# Patient Record
Sex: Female | Born: 1950 | Race: White | Hispanic: No | Marital: Married | Smoking: Never smoker
Health system: Southern US, Community
[De-identification: ages and names within clinical notes are randomized; demographics above are authoritative.]

## PROBLEM LIST (undated history)

## (undated) DIAGNOSIS — E7800 Pure hypercholesterolemia, unspecified: Secondary | ICD-10-CM

## (undated) DIAGNOSIS — T7840XA Allergy, unspecified, initial encounter: Secondary | ICD-10-CM

## (undated) DIAGNOSIS — C801 Malignant (primary) neoplasm, unspecified: Secondary | ICD-10-CM

## (undated) DIAGNOSIS — Z923 Personal history of irradiation: Secondary | ICD-10-CM

## (undated) DIAGNOSIS — J302 Other seasonal allergic rhinitis: Secondary | ICD-10-CM

## (undated) HISTORY — PX: ABDOMINAL HYSTERECTOMY: SHX81

## (undated) HISTORY — DX: Allergy, unspecified, initial encounter: T78.40XA

## (undated) HISTORY — DX: Other seasonal allergic rhinitis: J30.2

## (undated) HISTORY — PX: BREAST LUMPECTOMY: SHX2

## (undated) HISTORY — DX: Malignant (primary) neoplasm, unspecified: C80.1

## (undated) HISTORY — PX: TONSILLECTOMY: SUR1361

## (undated) HISTORY — DX: Pure hypercholesterolemia, unspecified: E78.00

---

## 1986-12-03 HISTORY — PX: PARTIAL HYSTERECTOMY: SHX80

## 2001-12-03 HISTORY — PX: BREAST EXCISIONAL BIOPSY: SUR124

## 2005-04-24 ENCOUNTER — Ambulatory Visit: Payer: Self-pay | Admitting: Internal Medicine

## 2006-08-15 ENCOUNTER — Ambulatory Visit: Payer: Self-pay | Admitting: Internal Medicine

## 2006-08-26 ENCOUNTER — Ambulatory Visit: Payer: Self-pay | Admitting: Internal Medicine

## 2007-09-10 ENCOUNTER — Ambulatory Visit: Payer: Self-pay | Admitting: Internal Medicine

## 2007-11-06 ENCOUNTER — Ambulatory Visit: Payer: Self-pay | Admitting: Unknown Physician Specialty

## 2008-12-21 ENCOUNTER — Ambulatory Visit: Payer: Self-pay | Admitting: Internal Medicine

## 2010-06-26 ENCOUNTER — Ambulatory Visit: Payer: Self-pay | Admitting: Internal Medicine

## 2011-10-29 ENCOUNTER — Ambulatory Visit: Payer: Self-pay | Admitting: Family Medicine

## 2012-01-08 LAB — TSH: TSH: 3.79 u[IU]/mL (ref <=5.90)

## 2012-01-08 LAB — BASIC METABOLIC PANEL
BUN: 10 mg/dL (ref 4–21)
Creatinine: 0.8 mg/dL (ref <=1.1)

## 2013-01-20 ENCOUNTER — Ambulatory Visit: Payer: Self-pay | Admitting: Internal Medicine

## 2013-02-11 ENCOUNTER — Telehealth: Payer: Self-pay | Admitting: Internal Medicine

## 2013-02-11 NOTE — Telephone Encounter (Signed)
Opened in error

## 2013-02-16 ENCOUNTER — Telehealth: Payer: Self-pay | Admitting: Internal Medicine

## 2013-02-16 NOTE — Telephone Encounter (Signed)
Pt has appointment  03/17/13 but wanted to see if she could be see sooner.  She thinks she has sinus infection. Pt dad is in hospital and she wants to make sure she is not contagious

## 2013-02-16 NOTE — Telephone Encounter (Signed)
I can work her in tomorrow at 11:45 for this problem.

## 2013-02-16 NOTE — Telephone Encounter (Signed)
Appointment 3/18 @ 9:30 pt aware

## 2013-02-17 ENCOUNTER — Encounter: Payer: Self-pay | Admitting: Internal Medicine

## 2013-02-17 ENCOUNTER — Ambulatory Visit (INDEPENDENT_AMBULATORY_CARE_PROVIDER_SITE_OTHER): Payer: BC Managed Care – PPO | Admitting: Internal Medicine

## 2013-02-17 VITALS — BP 128/80 | HR 80 | Temp 97.6°F | Ht 63.16 in | Wt 115.2 lb

## 2013-02-17 DIAGNOSIS — J019 Acute sinusitis, unspecified: Secondary | ICD-10-CM

## 2013-02-17 MED ORDER — FLUTICASONE PROPIONATE 50 MCG/ACT NA SUSP: 2.0000 | Freq: Every day | NASAL | Status: DC

## 2013-02-17 MED ORDER — LEVOFLOXACIN 500 MG PO TABS: 500.0000 mg | ORAL_TABLET | Freq: Every day | ORAL | Status: DC

## 2013-02-17 NOTE — Patient Instructions (Addendum)
Afrin nasal spray - 2 sprays each nostril 2x/day for three days.  Can also use around air flight as discussed.  Flonase nasal spray - 2 sprays each nostril one time per day - do in the evening.  Can continue the saline flushes.  You will have Levaquin 500mg  - one per day.

## 2013-02-22 ENCOUNTER — Encounter: Payer: Self-pay | Admitting: Internal Medicine

## 2013-02-22 NOTE — Progress Notes (Signed)
  Subjective:    Patient ID: Tracy Frey, female    DOB: [birth year], 62 y.o.   MRN: [id number]  HPI 62 year old female with past history of hypercholesterolemia who comes in today as a work in with concerns regarding a possible sinus infection.  She states symptoms started two weeks ago.  Increased sinus pressure - frontal and maxillary sinus pressure.  Thick colored mucus production.  Intermittently feels as if her ears are closed.  Intermittent sore throat.  Increased drainage.  Cough is better.  Some fatigue.  No nausea or vomiting.  No sob.     Past Medical History  Diagnosis Date  . Hypercholesterolemia   . Seasonal allergies     Outpatient Encounter Prescriptions as of 02/17/2013  Medication Sig Dispense Refill  . cetirizine (ZYRTEC) 10 MG tablet Take 10 mg by mouth daily.      . Oxymetazoline HCl (NASAL SPRAY 12 HOUR NA) Place into the nose.      . pseudoephedrine (SUDAFED) 30 MG tablet Take 30 mg by mouth every 4 (four) hours as needed for congestion.      Marland Kitchen spironolactone (ALDACTONE) 25 MG tablet Take 25 mg by mouth 2 (two) times daily.      . fluticasone (FLONASE) 50 MCG/ACT nasal spray Place 2 sprays into the nose daily.  16 g  1  . levofloxacin (LEVAQUIN) 500 MG tablet Take 1 tablet (500 mg total) by mouth daily.  10 tablet  0   No facility-administered encounter medications on file as of 02/17/2013.    Review of Systems Patient denies any lightheadedness or dizziness.  Does report increased sinus pressure as outlined above.  Increased nasal congestion and drainage.  Intermittent sore throat.  No chest pain, tightness or palpitations.  No increased shortness of breath.  Cough is better.   No nausea or vomiting.  No abdominal pain or cramping.  No bowel change, such as diarrhea.          Objective:   Physical Exam Filed Vitals:   02/17/13 0944  BP: 128/80  Pulse: 80  Temp: 97.6 F (19.15 C)   62 year old female in no acute distress.   HEENT:  Nares- erythematous  turbinates.  Oropharynx - without lesions.  TMs visualized without erythema.  Minimal tenderness to palpation over the maxillary and frontal sinus.  NECK:  Supple.  Nontender.  HEART:  Appears to be regular. LUNGS:  No crackles or wheezing audible.  Respirations even and unlabored.  RADIAL PULSE:  Equal bilaterally.         Assessment & Plan:  PROBABLE SINUSITIS.  Treat with Levaquin 500mg  q day x 10 days.  Saline nasal flushes and Flonase as directed.  Mucinex or Robitussin as directed.  Rest.  Fluids.  Explained to her if symptoms changed, worsened or did not resolve - she was to be reevaluated.    HEALTH MAINTENANCE.  She is scheduled to transfer her care to [facility] and a physical in April. Will keep appt.  Last physical 12/25/11.  Colonoscopy 11/06/07.  Due follow up colonoscopy 2018.  Mammogram 10/29/11 - Birads II.  Need to get a follow up mammo scheduled if not already done.

## 2013-03-04 ENCOUNTER — Encounter: Payer: Self-pay | Admitting: *Deleted

## 2013-03-05 ENCOUNTER — Encounter: Payer: Self-pay | Admitting: *Deleted

## 2013-03-17 ENCOUNTER — Ambulatory Visit (INDEPENDENT_AMBULATORY_CARE_PROVIDER_SITE_OTHER): Payer: BC Managed Care – PPO | Admitting: Internal Medicine

## 2013-03-17 ENCOUNTER — Encounter: Payer: Self-pay | Admitting: Internal Medicine

## 2013-03-17 VITALS — BP 130/80 | HR 79 | Temp 98.5°F | Ht 63.5 in | Wt 114.0 lb

## 2013-03-17 DIAGNOSIS — Z1211 Encounter for screening for malignant neoplasm of colon: Secondary | ICD-10-CM

## 2013-03-17 DIAGNOSIS — Z9109 Other allergy status, other than to drugs and biological substances: Secondary | ICD-10-CM

## 2013-03-17 DIAGNOSIS — E7800 Pure hypercholesterolemia, unspecified: Secondary | ICD-10-CM

## 2013-03-17 DIAGNOSIS — Z1239 Encounter for other screening for malignant neoplasm of breast: Secondary | ICD-10-CM

## 2013-03-22 ENCOUNTER — Encounter: Payer: Self-pay | Admitting: Internal Medicine

## 2013-03-22 DIAGNOSIS — E7800 Pure hypercholesterolemia, unspecified: Secondary | ICD-10-CM | POA: Insufficient documentation

## 2013-03-22 DIAGNOSIS — Z9109 Other allergy status, other than to drugs and biological substances: Secondary | ICD-10-CM | POA: Insufficient documentation

## 2013-03-22 NOTE — Assessment & Plan Note (Signed)
Controls with otc meds (zyrtec).  Follow.   

## 2013-03-22 NOTE — Assessment & Plan Note (Signed)
Exercises.  Watches what she eats.  Check lipid profile.
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
## 2013-04-02 ENCOUNTER — Telehealth: Payer: Self-pay | Admitting: Internal Medicine

## 2013-04-02 NOTE — Telephone Encounter (Signed)
Pt has questions about how things are coded.  Pt states that if labs are entered as diagnostic they are not covered as they are if labs are entered as preventative.  Pt states these labs should be entered as part of her yearly physical, and therefore should be covered.

## 2013-04-03 NOTE — Telephone Encounter (Signed)
I am ok to code labs as preventive.  Let me know if I need to do anything.

## 2013-04-16 ENCOUNTER — Other Ambulatory Visit: Payer: Self-pay | Admitting: *Deleted

## 2013-04-16 DIAGNOSIS — Z0000 Encounter for general adult medical examination without abnormal findings: Secondary | ICD-10-CM

## 2013-04-16 NOTE — Telephone Encounter (Signed)
duplicate

## 2013-04-16 NOTE — Telephone Encounter (Signed)
If she wants a routine screening - just do v70.0.  Will you let her know.  Thanks.

## 2013-04-16 NOTE — Telephone Encounter (Signed)
i will cancel and re-enter them, what dx code did you want for them?

## 2013-04-16 NOTE — Telephone Encounter (Signed)
Dr. Lorin Picket, he hasn't came in for these labs yet. I believe that you would choose the code before they are drawn. I am not sure of how to do this.  I will fwd to Eber Jones maybe she knows which button to push.

## 2013-04-16 NOTE — Telephone Encounter (Signed)
Dx code was changed

## 2013-04-16 NOTE — Telephone Encounter (Signed)
i will cancel and re-enter them, what dx code did you want for them?   

## 2013-04-17 ENCOUNTER — Other Ambulatory Visit (INDEPENDENT_AMBULATORY_CARE_PROVIDER_SITE_OTHER): Payer: BC Managed Care – PPO

## 2013-04-17 DIAGNOSIS — Z0000 Encounter for general adult medical examination without abnormal findings: Secondary | ICD-10-CM

## 2013-04-17 LAB — CBC WITH DIFFERENTIAL/PLATELET
Basophils Relative: 0.8 % (ref 0.0–3.0)
Eosinophils Relative: 2 % (ref 0.0–5.0)
Lymphocytes Relative: 24.2 % (ref 12.0–46.0)
MCV: 92.6 fl (ref 78.0–100.0)
Monocytes Absolute: 0.5 10*3/uL (ref 0.1–1.0)
Neutrophils Relative %: 62.7 % (ref 43.0–77.0)
RBC: 4.8 Mil/uL (ref 3.87–5.11)
WBC: 5.1 10*3/uL (ref 4.5–10.5)

## 2013-04-17 LAB — COMPREHENSIVE METABOLIC PANEL
Albumin: 4.2 g/dL (ref 3.5–5.2)
CO2: 32 mEq/L (ref 19–32)
Calcium: 10 mg/dL (ref 8.4–10.5)
Chloride: 102 mEq/L (ref 96–112)
GFR: 64.44 mL/min (ref >60.00)
Glucose, Bld: 97 mg/dL (ref 70–99)
Potassium: 4.4 mEq/L (ref 3.5–5.1)
Sodium: 139 mEq/L (ref 135–145)
Total Protein: 6.6 g/dL (ref 6.0–8.3)

## 2013-04-17 LAB — LIPID PANEL: Cholesterol: 247 mg/dL — ABNORMAL HIGH (ref 0–200)

## 2013-04-19 ENCOUNTER — Encounter: Payer: Self-pay | Admitting: Internal Medicine

## 2013-04-30 ENCOUNTER — Ambulatory Visit: Payer: Self-pay | Admitting: Internal Medicine

## 2013-06-18 ENCOUNTER — Encounter: Payer: Self-pay | Admitting: Internal Medicine

## 2013-10-08 ENCOUNTER — Other Ambulatory Visit: Payer: Self-pay

## 2013-11-06 ENCOUNTER — Telehealth: Payer: Self-pay | Admitting: Internal Medicine

## 2013-11-06 NOTE — Telephone Encounter (Signed)
States to call cell regarding previous ph msg. [phone].

## 2013-11-06 NOTE — Telephone Encounter (Signed)
Unable to call in abx now.  I can see her at 12:15 for work in for this.

## 2013-11-06 NOTE — Telephone Encounter (Signed)
Pt notified & offered appointment today at 12:15. She states that she mentioned when she called this morning that she had a meeting at 11:15 this morning. Pt also mentioned to me that she has to entertain guest as well. She is a long time patient and only calls when she needs to be seen or if she is really sick. I informed patient that we are aware of that, however she has not been seen since April. I offered her an appt on Monday @ 2:15 if she would be willing to wait that long. Otherwise I recommend acute care or urgent care today. Pt states that since her sx's have been ongoing x 1 week now, she would take appt on Monday unless you would be willing to call something in today. Appt scheduled

## 2013-11-06 NOTE — Telephone Encounter (Signed)
Noted. Will see her Monday

## 2013-11-06 NOTE — Telephone Encounter (Signed)
Please advise 

## 2013-11-06 NOTE — Telephone Encounter (Signed)
Pt states she has her usual sinus infection that hits once a year.  Started Thanksgiving, has been ongoing.  Thick drainage.  No fever.  Head/face pressure.  Not a sore throat but tender from drainage.  No aches.  Asking if Dr. Lorin Picket could call in her usual medication.  Also is out of Flonase.

## 2013-11-09 ENCOUNTER — Ambulatory Visit (INDEPENDENT_AMBULATORY_CARE_PROVIDER_SITE_OTHER): Payer: BC Managed Care – PPO | Admitting: Internal Medicine

## 2013-11-09 ENCOUNTER — Encounter: Payer: Self-pay | Admitting: Internal Medicine

## 2013-11-09 VITALS — BP 130/80 | HR 75 | Temp 98.0°F | Ht 63.5 in | Wt 115.0 lb

## 2013-11-09 DIAGNOSIS — J329 Chronic sinusitis, unspecified: Secondary | ICD-10-CM

## 2013-11-09 MED ORDER — FLUTICASONE PROPIONATE 50 MCG/ACT NA SUSP: 2.0000 | Freq: Every day | NASAL | Status: DC

## 2013-11-09 MED ORDER — LEVOFLOXACIN 500 MG PO TABS: 500.0000 mg | ORAL_TABLET | Freq: Every day | ORAL | Status: DC

## 2013-11-09 NOTE — Progress Notes (Signed)
Pre-visit discussion using our clinic review tool. No additional management support is needed unless otherwise documented below in the visit note.  

## 2013-11-09 NOTE — Patient Instructions (Signed)
Saline nasal spray - flush nose at least 2-3x/day.  Flonase - 2 sprays each nostril one time per day.  Do this in the evening.  Mucinex in the am and Robitussin in the evening.  Take the antibiotic as directed.

## 2013-11-12 ENCOUNTER — Encounter: Payer: Self-pay | Admitting: Internal Medicine

## 2013-11-12 NOTE — Progress Notes (Signed)
  Subjective:    Patient ID: Tracy Frey, female    DOB: [birth year], 62 y.o.   MRN: [id number]  Sinusitis  62 year old female with past history of hypercholesterolemia who comes in today as a work in with concerns regarding a possible sinus infection.  She states symptoms started approximately one week ago.   Increased sinus pressure - frontal and maxillary sinus pressure.  Thick colored mucus production.  Intermittently feels as if her ears are closed.  Intermittent sore throat.  Increased drainage.   No nausea or vomiting.  No sob.  Previous chest congestion and cough.  This is better.  Mainly increased sinus pressure now.  Has taken sudafed and used saline nasal spray.    Past Medical History  Diagnosis Date  . Hypercholesterolemia   . Seasonal allergies     Outpatient Encounter Prescriptions as of 11/09/2013  Medication Sig  . Calcium Citrate-Vitamin D (CALCIUM CITRATE + D PO) Take by mouth daily.  . cetirizine (ZYRTEC) 10 MG tablet Take 10 mg by mouth daily.  . Cholecalciferol (VITAMIN D-3) 1000 UNITS CAPS Take by mouth 2 (two) times daily.  . fluticasone (FLONASE) 50 MCG/ACT nasal spray Place 2 sprays into both nostrils daily.  . Glucosamine-Chondroitin (OSTEO BI-FLEX REGULAR STRENGTH PO) Take 1,500 mg by mouth daily.  Boris Lown Oil 300 MG CAPS Take by mouth daily.  . Multiple Vitamins-Minerals (CENTRUM SILVER ADULT 50+) TABS Take by mouth daily.  . pseudoephedrine (SUDAFED) 30 MG tablet Take 30 mg by mouth every 4 (four) hours as needed for congestion.  Marland Kitchen spironolactone (ALDACTONE) 25 MG tablet Take 25 mg by mouth 2 (two) times daily.  . vitamin E 400 UNIT capsule Take 400 Units by mouth every other day.  . [DISCONTINUED] fluticasone (FLONASE) 50 MCG/ACT nasal spray Place 2 sprays into the nose daily.  . [DISCONTINUED] Oxymetazoline HCl (NASAL SPRAY 12 HOUR NA) Place into the nose.  . levofloxacin (LEVAQUIN) 500 MG tablet Take 1 tablet (500 mg total) by mouth daily.    Review of  Systems Patient denies any lightheadedness or dizziness.  Does report increased sinus pressure as outlined above.  Increased nasal congestion and drainage.  Intermittent sore throat.  No chest pain, tightness or palpitations.  No increased shortness of breath.  Cough is better.   No nausea or vomiting.  No abdominal pain or cramping.  No bowel change, such as diarrhea.          Objective:   Physical Exam  Filed Vitals:   11/09/13 1421  BP: 130/80  Pulse: 75  Temp: 98 F (50.83 C)   62 year old female in no acute distress.   HEENT:  Nares- erythematous turbinates.  Oropharynx - without lesions.  TMs visualized without erythema.  Minimal tenderness to palpation over the maxillary and frontal sinus.  NECK:  Supple.  Nontender.  HEART:  Appears to be regular. LUNGS:  No crackles or wheezing audible.  Respirations even and unlabored.        Assessment & Plan:  PROBABLE SINUSITIS.  Treat with Levaquin 500mg  q day x 10 days.  Saline nasal flushes and Flonase as directed.  Mucinex or Robitussin as directed.  Rest.  Fluids.  Explained to her if symptoms changed, worsened or did not resolve - she was to be reevaluated.    HEALTH MAINTENANCE.  Last physical 03/17/13.  Colonoscopy 11/06/07.  Due follow up colonoscopy 2018.  Mammogram 04/30/13 - Birads II.

## 2014-03-18 ENCOUNTER — Telehealth: Payer: Self-pay | Admitting: Internal Medicine

## 2014-03-18 DIAGNOSIS — Z9109 Other allergy status, other than to drugs and biological substances: Secondary | ICD-10-CM

## 2014-03-18 DIAGNOSIS — E7800 Pure hypercholesterolemia, unspecified: Secondary | ICD-10-CM

## 2014-03-18 NOTE — Telephone Encounter (Signed)
Pt called scheduled her cpx 07/14/14 and wanted to have labs prior to appointment Is this ok to schedule

## 2014-03-19 NOTE — Telephone Encounter (Signed)
Order placed for labs.  Ok to schedule.

## 2014-03-19 NOTE — Telephone Encounter (Signed)
Appointment date 8/10 sent my chart message letting pt know about appointment date and time

## 2014-07-09 ENCOUNTER — Telehealth: Payer: Self-pay | Admitting: *Deleted

## 2014-07-09 DIAGNOSIS — Z9109 Other allergy status, other than to drugs and biological substances: Secondary | ICD-10-CM

## 2014-07-09 DIAGNOSIS — E7800 Pure hypercholesterolemia, unspecified: Secondary | ICD-10-CM

## 2014-07-09 NOTE — Telephone Encounter (Signed)
Pt is coming in on Monday what labs and dx?

## 2014-07-09 NOTE — Telephone Encounter (Signed)
Labs ordered.

## 2014-07-12 ENCOUNTER — Other Ambulatory Visit (INDEPENDENT_AMBULATORY_CARE_PROVIDER_SITE_OTHER): Payer: BC Managed Care – PPO

## 2014-07-12 ENCOUNTER — Encounter: Payer: Self-pay | Admitting: Internal Medicine

## 2014-07-12 DIAGNOSIS — E7800 Pure hypercholesterolemia, unspecified: Secondary | ICD-10-CM

## 2014-07-12 DIAGNOSIS — Z9109 Other allergy status, other than to drugs and biological substances: Secondary | ICD-10-CM

## 2014-07-12 LAB — CBC WITH DIFFERENTIAL/PLATELET
BASOS ABS: 0 10*3/uL (ref 0.0–0.1)
Basophils Relative: 0.6 % (ref 0.0–3.0)
EOS ABS: 0.1 10*3/uL (ref 0.0–0.7)
Eosinophils Relative: 2.6 % (ref 0.0–5.0)
HEMATOCRIT: 44.1 % (ref 36.0–46.0)
HEMOGLOBIN: 14.9 g/dL (ref 12.0–15.0)
LYMPHS ABS: 1.4 10*3/uL (ref 0.7–4.0)
LYMPHS PCT: 28.5 % (ref 12.0–46.0)
MCHC: 33.8 g/dL (ref 30.0–36.0)
MCV: 92.9 fl (ref 78.0–100.0)
Monocytes Absolute: 0.5 10*3/uL (ref 0.1–1.0)
Monocytes Relative: 11.2 % (ref 3.0–12.0)
Neutro Abs: 2.8 10*3/uL (ref 1.4–7.7)
Neutrophils Relative %: 57.1 % (ref 43.0–77.0)
Platelets: 245 10*3/uL (ref 150.0–400.0)
RBC: 4.74 Mil/uL (ref 3.87–5.11)
RDW: 12.7 % (ref 11.5–15.5)
WBC: 4.9 10*3/uL (ref 4.0–10.5)

## 2014-07-12 LAB — LIPID PANEL
Cholesterol: 263 mg/dL — ABNORMAL HIGH (ref 0–200)
HDL: 52.7 mg/dL (ref >39.00)
LDL Cholesterol: 186 mg/dL — ABNORMAL HIGH (ref 0–99)
NONHDL: 210.3
TRIGLYCERIDES: 123 mg/dL (ref 0.0–149.0)
Total CHOL/HDL Ratio: 5
VLDL: 24.6 mg/dL (ref 0.0–40.0)

## 2014-07-12 LAB — COMPREHENSIVE METABOLIC PANEL
ALT: 24 U/L (ref 0–35)
AST: 18 U/L (ref 0–37)
Albumin: 4.3 g/dL (ref 3.5–5.2)
Alkaline Phosphatase: 38 U/L — ABNORMAL LOW (ref 39–117)
BILIRUBIN TOTAL: 1.1 mg/dL (ref 0.2–1.2)
BUN: 12 mg/dL (ref 6–23)
CHLORIDE: 103 meq/L (ref 96–112)
CO2: 30 mEq/L (ref 19–32)
Calcium: 10.1 mg/dL (ref 8.4–10.5)
Creatinine, Ser: 0.8 mg/dL (ref 0.4–1.2)
GFR: 83.28 mL/min (ref >60.00)
GLUCOSE: 104 mg/dL — AB (ref 70–99)
Potassium: 4.2 mEq/L (ref 3.5–5.1)
Sodium: 140 mEq/L (ref 135–145)
Total Protein: 6.8 g/dL (ref 6.0–8.3)

## 2014-07-12 LAB — TSH: TSH: 2.39 u[IU]/mL (ref 0.35–4.50)

## 2014-07-14 ENCOUNTER — Ambulatory Visit (INDEPENDENT_AMBULATORY_CARE_PROVIDER_SITE_OTHER): Payer: BC Managed Care – PPO | Admitting: Internal Medicine

## 2014-07-14 ENCOUNTER — Encounter: Payer: Self-pay | Admitting: Internal Medicine

## 2014-07-14 VITALS — BP 130/70 | HR 73 | Temp 98.2°F | Ht 63.25 in | Wt 118.0 lb

## 2014-07-14 DIAGNOSIS — F439 Reaction to severe stress, unspecified: Secondary | ICD-10-CM

## 2014-07-14 DIAGNOSIS — E7800 Pure hypercholesterolemia, unspecified: Secondary | ICD-10-CM

## 2014-07-14 DIAGNOSIS — Z1239 Encounter for other screening for malignant neoplasm of breast: Secondary | ICD-10-CM

## 2014-07-14 DIAGNOSIS — Z733 Stress, not elsewhere classified: Secondary | ICD-10-CM

## 2014-07-14 DIAGNOSIS — Z9109 Other allergy status, other than to drugs and biological substances: Secondary | ICD-10-CM

## 2014-07-14 NOTE — Progress Notes (Signed)
Pre visit review using our clinic review tool, if applicable. No additional management support is needed unless otherwise documented below in the visit note. 

## 2014-07-18 ENCOUNTER — Encounter: Payer: Self-pay | Admitting: Internal Medicine

## 2014-07-18 DIAGNOSIS — F439 Reaction to severe stress, unspecified: Secondary | ICD-10-CM | POA: Insufficient documentation

## 2014-07-18 NOTE — Assessment & Plan Note (Signed)
Watches what she eats.  Has not been exercising.  Plans to restart.  Discussed treatment options with her.  She desires not to take a statin medication.  Wants to try red yeast rice or metamucil.  Follow lipid profile.

## 2014-07-18 NOTE — Assessment & Plan Note (Signed)
Controls with otc meds (zyrtec).  Follow.

## 2014-07-18 NOTE — Progress Notes (Signed)
Subjective:    Patient ID: Tracy Frey, female    DOB: 10-26-1951, 63 y.o.   MRN: [id number]  HPI 63 year old female with past history of hypercholesterolemia who comes in today to follow up on this as well as for her complete physical exam.   Uses zyrtec for allergies.   Increased stress recently.  Her father passed away.  Feels she is handling things relatively well.   No cardiac symptoms with increased activity or exertion.  Breathing stable.  No acid reflux.  Bowels stable.  Due f/u colonoscopy 2018.   Cholesterol elevated.  Desires not to take cholesterol medication.  We discussed this at length today.  Discussed treatment options.     Past Medical History  Diagnosis Date  . Hypercholesterolemia   . Seasonal allergies     Outpatient Encounter Prescriptions as of 07/14/2014  Medication Sig  . Calcium Citrate-Vitamin D (CALCIUM CITRATE + D PO) Take by mouth daily.  . cetirizine (ZYRTEC) 10 MG tablet Take 10 mg by mouth daily.  . Cholecalciferol (VITAMIN D-3) 1000 UNITS CAPS Take by mouth 2 (two) times daily.  . fluticasone (FLONASE) 50 MCG/ACT nasal spray Place 2 sprays into both nostrils daily.  . Glucosamine-Chondroitin (OSTEO BI-FLEX REGULAR STRENGTH PO) Take 1,500 mg by mouth daily.  Javier Docker Oil 300 MG CAPS Take by mouth daily.  . Multiple Vitamins-Minerals (CENTRUM SILVER ADULT 50+) TABS Take by mouth daily.  . pseudoephedrine (SUDAFED) 30 MG tablet Take 30 mg by mouth every 4 (four) hours as needed for congestion.  Marland Kitchen spironolactone (ALDACTONE) 25 MG tablet Take 25 mg by mouth 2 (two) times daily.  . vitamin E 400 UNIT capsule Take 400 Units by mouth every other day.  . [DISCONTINUED] levofloxacin (LEVAQUIN) 500 MG tablet Take 1 tablet (500 mg total) by mouth daily.    Review of Systems Patient denies any headache, lightheadedness or dizziness.  Allergy symptoms appear to be controlled.  No chest pain, tightness or palpitations.  No increased shortness of breath, cough or  congestion.  No acid reflux.  No nausea or vomiting.  No abdominal pain or cramping.  No bowel change, such as diarrhea, constipation, BRBPR or melana.  No urine change. Increased stress.  Feels she is coping relatively well.  Does not feel she needs any further intervention at this time.  Cholesterol elevated.           Objective:   Physical Exam  Filed Vitals:   07/14/14 1036  BP: 130/70  Pulse: 73  Temp: 98.2 F (62.60 C)   63 year old female in no acute distress.   HEENT:  Nares- clear.  Oropharynx - without lesions. NECK:  Supple.  Nontender.  No audible bruit.  HEART:  Appears to be regular. LUNGS:  No crackles or wheezing audible.  Respirations even and unlabored.  RADIAL PULSE:  Equal bilaterally.    BREASTS:  No nipple discharge or nipple retraction present.  Could not appreciate any distinct nodules or axillary adenopathy.  ABDOMEN:  Soft, nontender.  Bowel sounds present and normal.  No audible abdominal bruit.  GU:  Not performed.   EXTREMITIES:  No increased edema present.  DP pulses palpable and equal bilaterally.          Assessment & Plan:  PREVIOUS NASAL FRACTURE.  Saw ENT.  Doing well.  Required no intervention.    HEALTH MAINTENANCE.  Physical today.  Colonoscopy 11/06/07.  Due follow up colonoscopy 2018.  IFOB.  Mammogram 04/30/13 -  Birads II.  Schedule follow up mammogram.   I spent 25 minutes with the patient discussing her increased stress, cholesterol and treatment.  More than 50% of the time was spent in consultation regarding the above.

## 2014-07-18 NOTE — Assessment & Plan Note (Signed)
Increased stress recently as outlined.  Her father just passed away.  Feels she is handling things relatively well.  Follow.

## 2014-08-16 ENCOUNTER — Encounter: Payer: Self-pay | Admitting: *Deleted

## 2014-08-16 ENCOUNTER — Ambulatory Visit: Payer: Self-pay | Admitting: Internal Medicine

## 2014-08-16 LAB — HM MAMMOGRAPHY: HM Mammogram: NEGATIVE

## 2015-01-17 ENCOUNTER — Other Ambulatory Visit: Payer: BC Managed Care – PPO

## 2015-07-12 ENCOUNTER — Other Ambulatory Visit (INDEPENDENT_AMBULATORY_CARE_PROVIDER_SITE_OTHER): Payer: BLUE CROSS/BLUE SHIELD

## 2015-07-12 DIAGNOSIS — E7800 Pure hypercholesterolemia, unspecified: Secondary | ICD-10-CM

## 2015-07-12 LAB — COMPREHENSIVE METABOLIC PANEL
ALBUMIN: 4.6 g/dL (ref 3.5–5.2)
ALT: 23 U/L (ref 0–35)
AST: 15 U/L (ref 0–37)
Alkaline Phosphatase: 44 U/L (ref 39–117)
BUN: 16 mg/dL (ref 6–23)
CALCIUM: 10.4 mg/dL (ref 8.4–10.5)
CO2: 32 mEq/L (ref 19–32)
CREATININE: 0.84 mg/dL (ref 0.40–1.20)
Chloride: 101 mEq/L (ref 96–112)
GFR: 72.84 mL/min (ref >60.00)
Glucose, Bld: 103 mg/dL — ABNORMAL HIGH (ref 70–99)
Potassium: 4.4 mEq/L (ref 3.5–5.1)
Sodium: 140 mEq/L (ref 135–145)
TOTAL PROTEIN: 7 g/dL (ref 6.0–8.3)
Total Bilirubin: 1.2 mg/dL (ref 0.2–1.2)

## 2015-07-12 LAB — LIPID PANEL
Cholesterol: 256 mg/dL — ABNORMAL HIGH (ref 0–200)
HDL: 51.9 mg/dL (ref >39.00)
LDL CALC: 182 mg/dL — AB (ref 0–99)
NonHDL: 203.73
Total CHOL/HDL Ratio: 5
Triglycerides: 110 mg/dL (ref 0.0–149.0)
VLDL: 22 mg/dL (ref 0.0–40.0)

## 2015-07-13 ENCOUNTER — Encounter: Payer: Self-pay | Admitting: Internal Medicine

## 2015-07-19 ENCOUNTER — Encounter: Payer: Self-pay | Admitting: Internal Medicine

## 2015-07-19 ENCOUNTER — Ambulatory Visit (INDEPENDENT_AMBULATORY_CARE_PROVIDER_SITE_OTHER): Payer: BLUE CROSS/BLUE SHIELD | Admitting: Internal Medicine

## 2015-07-19 VITALS — BP 112/74 | HR 78 | Temp 98.1°F | Ht 63.5 in | Wt 119.4 lb

## 2015-07-19 DIAGNOSIS — E7800 Pure hypercholesterolemia, unspecified: Secondary | ICD-10-CM

## 2015-07-19 DIAGNOSIS — Z91048 Other nonmedicinal substance allergy status: Secondary | ICD-10-CM | POA: Diagnosis not present

## 2015-07-19 DIAGNOSIS — Z1239 Encounter for other screening for malignant neoplasm of breast: Secondary | ICD-10-CM | POA: Diagnosis not present

## 2015-07-19 DIAGNOSIS — Z1211 Encounter for screening for malignant neoplasm of colon: Secondary | ICD-10-CM | POA: Diagnosis not present

## 2015-07-19 DIAGNOSIS — Z9109 Other allergy status, other than to drugs and biological substances: Secondary | ICD-10-CM

## 2015-07-19 DIAGNOSIS — R739 Hyperglycemia, unspecified: Secondary | ICD-10-CM

## 2015-07-19 DIAGNOSIS — Z0000 Encounter for general adult medical examination without abnormal findings: Secondary | ICD-10-CM

## 2015-07-19 DIAGNOSIS — Z658 Other specified problems related to psychosocial circumstances: Secondary | ICD-10-CM

## 2015-07-19 DIAGNOSIS — F439 Reaction to severe stress, unspecified: Secondary | ICD-10-CM

## 2015-07-19 NOTE — Progress Notes (Signed)
Patient ID: Tracy Frey, female   DOB: 10/04/1951, 64 y.o.   MRN: [id number]   Subjective:    Patient ID: Tracy Frey, female    DOB: 03-08-1951, 64 y.o.   MRN: [id number]  HPI  Patient here to follow up on her current medical issues as well as for a complete physical exam.  Doing well.  Staying active.  Cholesterol still elevated.  She is not exercising as much.  Plans to start.  No cardiac symptoms with increased activity or exertion.  No sob.  No acid reflux reported.  Bowels stable.  No recent sinus issues.  Cholesterol still elevated.  Discussed treatment.  She declines medication.     Past Medical History  Diagnosis Date  . Hypercholesterolemia   . Seasonal allergies     Family history and social history reviewed.     Outpatient Encounter Prescriptions as of 07/19/2015  Medication Sig  . Calcium Citrate-Vitamin D (CALCIUM CITRATE + D PO) Take by mouth daily.  . cetirizine (ZYRTEC) 10 MG tablet Take 10 mg by mouth daily.  . Cholecalciferol (VITAMIN D-3) 1000 UNITS CAPS Take by mouth 2 (two) times daily.  . fluticasone (FLONASE) 50 MCG/ACT nasal spray Place 2 sprays into both nostrils daily.  . Glucosamine-Chondroitin (OSTEO BI-FLEX REGULAR STRENGTH PO) Take 1,500 mg by mouth daily.  . Multiple Vitamins-Minerals (CENTRUM SILVER ADULT 50+) TABS Take by mouth daily.  . Omega-3 Fatty Acids (FISH OIL) 1000 MG CAPS Take by mouth.  Marland Kitchen OVER THE COUNTER MEDICATION Tumeric OTC supplement  . pseudoephedrine (SUDAFED) 30 MG tablet Take 30 mg by mouth every 4 (four) hours as needed for congestion.  Marland Kitchen spironolactone (ALDACTONE) 25 MG tablet Take 25 mg by mouth 2 (two) times daily.  . vitamin E 400 UNIT capsule Take 400 Units by mouth every other day.  . [DISCONTINUED] Krill Oil 300 MG CAPS Take by mouth daily.   No facility-administered encounter medications on file as of 07/19/2015.    Review of Systems  Constitutional: Negative for appetite change and unexpected weight change.    HENT: Negative for congestion and sinus pressure.   Eyes: Negative for pain and visual disturbance.  Respiratory: Negative for cough, chest tightness and shortness of breath.   Cardiovascular: Negative for chest pain, palpitations and leg swelling.  Gastrointestinal: Negative for nausea, vomiting, abdominal pain and diarrhea.  Genitourinary: Negative for dysuria and difficulty urinating.  Musculoskeletal: Negative for back pain and joint swelling.  Skin: Negative for color change and rash.  Neurological: Negative for dizziness, light-headedness and headaches.  Hematological: Negative for adenopathy. Does not bruise/bleed easily.  Psychiatric/Behavioral: Negative for dysphoric mood and agitation.       Objective:    Physical Exam  Constitutional: She is oriented to person, place, and time. She appears well-developed and well-nourished.  HENT:  Nose: Nose normal.  Mouth/Throat: Oropharynx is clear and moist.  Eyes: Right eye exhibits no discharge. Left eye exhibits no discharge. No scleral icterus.  Neck: Neck supple. No thyromegaly present.  Cardiovascular: Normal rate and regular rhythm.   Pulmonary/Chest: Breath sounds normal. No accessory muscle usage. No tachypnea. No respiratory distress. She has no decreased breath sounds. She has no wheezes. She has no rhonchi. Right breast exhibits no inverted nipple, no mass, no nipple discharge and no tenderness (no axillary adenopathy). Left breast exhibits no inverted nipple, no mass, no nipple discharge and no tenderness (no axilarry adenopathy).  Abdominal: Soft. Bowel sounds are normal. There is no tenderness.  Musculoskeletal:  She exhibits no edema or tenderness.  Lymphadenopathy:    She has no cervical adenopathy.  Neurological: She is alert and oriented to person, place, and time.  Skin: Skin is warm. No rash noted.  Psychiatric: She has a normal mood and affect. Her behavior is normal.    BP 112/74 mmHg  Pulse 78  Temp(Src)  98.1 F (36.7 C) (Oral)  Ht 5' 3.5" (1.613 m)  Wt 119 lb 6.4 oz (54.159 kg)  BMI 20.82 kg/m2  SpO2 98% Wt Readings from Last 3 Encounters:  07/19/15 119 lb 6.4 oz (54.159 kg)  07/14/14 118 lb (53.524 kg)  11/09/13 115 lb (52.164 kg)     Lab Results  Component Value Date   WBC 4.9 07/12/2014   HGB 14.9 07/12/2014   HCT 44.1 07/12/2014   PLT 245.0 07/12/2014   GLUCOSE 103* 07/12/2015   CHOL 256* 07/12/2015   TRIG 110.0 07/12/2015   HDL 51.90 07/12/2015   LDLDIRECT 168.1 04/17/2013   LDLCALC 182* 07/12/2015   ALT 23 07/12/2015   AST 15 07/12/2015   NA 140 07/12/2015   K 4.4 07/12/2015   CL 101 07/12/2015   CREATININE 0.84 07/12/2015   BUN 16 07/12/2015   CO2 32 07/12/2015   TSH 2.39 07/12/2014       Assessment & Plan:   Problem List Items Addressed This Visit    Environmental allergies    Controls with otc meds (zyrtec).  No recent sinus issues.        Health care maintenance    Physical today 07/19/15.  Colonoscopy 11/06/07.  Due f/u colonoscopy in 2018.  Is s/p hysterectomy.  Mammogram 9/141/5 - Birads II.  Schedule f/u mammogram.  IFOB given.        Hypercholesterolemia    Discussed at length with her today.  Discussed medications.  She declines prescription medication.  Wants to try to exercise more.  Follow.        Relevant Orders   Comprehensive metabolic panel   Lipid panel   Stress    She feels she is handling things relatively well.  Follow.         Other Visit Diagnoses    Breast cancer screening    -  Primary    Relevant Orders    MM DIGITAL SCREENING BILATERAL    Colon cancer screening        Relevant Orders    Fecal occult blood, imunochemical    Hyperglycemia        Relevant Orders    Hemoglobin A1c        Einar Pheasant, MD

## 2015-07-19 NOTE — Progress Notes (Signed)
Pre visit review using our clinic review tool, if applicable. No additional management support is needed unless otherwise documented below in the visit note. 

## 2015-07-20 ENCOUNTER — Encounter: Payer: Self-pay | Admitting: Internal Medicine

## 2015-07-20 DIAGNOSIS — Z0000 Encounter for general adult medical examination without abnormal findings: Secondary | ICD-10-CM | POA: Insufficient documentation

## 2015-07-20 NOTE — Assessment & Plan Note (Signed)
Discussed at length with her today.  Discussed medications.  She declines prescription medication.  Wants to try to exercise more.  Follow.

## 2015-07-20 NOTE — Assessment & Plan Note (Signed)
She feels she is handling things relatively well.  Follow.   

## 2015-07-20 NOTE — Assessment & Plan Note (Signed)
Controls with otc meds (zyrtec).  No recent sinus issues.

## 2015-07-20 NOTE — Assessment & Plan Note (Signed)
Physical today 07/19/15.  Colonoscopy 11/06/07.  Due f/u colonoscopy in 2018.  Is s/p hysterectomy.  Mammogram 9/141/5 - Birads II.  Schedule f/u mammogram.  IFOB given.

## 2015-08-18 ENCOUNTER — Ambulatory Visit
Admission: RE | Admit: 2015-08-18 | Discharge: 2015-08-18 | Disposition: A | Discharge status: Home / Self Care | Payer: BLUE CROSS/BLUE SHIELD | Source: Ambulatory Visit | Attending: Internal Medicine | Admitting: Internal Medicine

## 2015-08-18 DIAGNOSIS — Z1239 Encounter for other screening for malignant neoplasm of breast: Secondary | ICD-10-CM

## 2015-08-18 DIAGNOSIS — Z1213 Encounter for screening for malignant neoplasm of small intestine: Secondary | ICD-10-CM | POA: Insufficient documentation

## 2015-08-18 DIAGNOSIS — Z1231 Encounter for screening mammogram for malignant neoplasm of breast: Secondary | ICD-10-CM | POA: Insufficient documentation

## 2016-01-17 ENCOUNTER — Other Ambulatory Visit: Payer: Self-pay

## 2016-01-19 ENCOUNTER — Ambulatory Visit: Payer: Self-pay | Admitting: Internal Medicine

## 2016-03-30 ENCOUNTER — Other Ambulatory Visit (INDEPENDENT_AMBULATORY_CARE_PROVIDER_SITE_OTHER): Payer: BLUE CROSS/BLUE SHIELD

## 2016-03-30 DIAGNOSIS — E7800 Pure hypercholesterolemia, unspecified: Secondary | ICD-10-CM | POA: Diagnosis not present

## 2016-03-30 DIAGNOSIS — R739 Hyperglycemia, unspecified: Secondary | ICD-10-CM

## 2016-03-30 LAB — COMPREHENSIVE METABOLIC PANEL
ALBUMIN: 4.4 g/dL (ref 3.5–5.2)
ALT: 17 U/L (ref 0–35)
AST: 12 U/L (ref 0–37)
Alkaline Phosphatase: 35 U/L — ABNORMAL LOW (ref 39–117)
BUN: 20 mg/dL (ref 6–23)
CALCIUM: 10 mg/dL (ref 8.4–10.5)
CHLORIDE: 101 meq/L (ref 96–112)
CO2: 32 mEq/L (ref 19–32)
Creatinine, Ser: 0.76 mg/dL (ref 0.40–1.20)
GFR: 81.57 mL/min (ref >60.00)
Glucose, Bld: 104 mg/dL — ABNORMAL HIGH (ref 70–99)
POTASSIUM: 3.9 meq/L (ref 3.5–5.1)
SODIUM: 138 meq/L (ref 135–145)
Total Bilirubin: 1 mg/dL (ref 0.2–1.2)
Total Protein: 6.6 g/dL (ref 6.0–8.3)

## 2016-03-30 LAB — LIPID PANEL
CHOLESTEROL: 248 mg/dL — AB (ref 0–200)
HDL: 53.2 mg/dL (ref >39.00)
LDL CALC: 169 mg/dL — AB (ref 0–99)
NonHDL: 195.19
TRIGLYCERIDES: 130 mg/dL (ref 0.0–149.0)
Total CHOL/HDL Ratio: 5
VLDL: 26 mg/dL (ref 0.0–40.0)

## 2016-03-30 LAB — HEMOGLOBIN A1C: Hgb A1c MFr Bld: 5.9 % (ref 4.6–6.5)

## 2016-04-02 ENCOUNTER — Encounter: Payer: Self-pay | Admitting: *Deleted

## 2016-08-17 ENCOUNTER — Telehealth: Payer: Self-pay | Admitting: *Deleted

## 2016-08-17 ENCOUNTER — Encounter: Payer: Self-pay | Admitting: Internal Medicine

## 2016-08-17 NOTE — Telephone Encounter (Signed)
Voicemail: pt will like to schedule a yearly physical Pt contact [phone]

## 2016-09-14 ENCOUNTER — Encounter: Payer: Self-pay | Admitting: Family Medicine

## 2016-09-14 ENCOUNTER — Ambulatory Visit (INDEPENDENT_AMBULATORY_CARE_PROVIDER_SITE_OTHER): Payer: BLUE CROSS/BLUE SHIELD | Admitting: Family Medicine

## 2016-09-14 DIAGNOSIS — J019 Acute sinusitis, unspecified: Secondary | ICD-10-CM | POA: Diagnosis not present

## 2016-09-14 MED ORDER — LEVOFLOXACIN 500 MG PO TABS: 500.0000 mg | ORAL_TABLET | Freq: Every day | ORAL | 0 refills | Status: DC

## 2016-09-14 NOTE — Patient Instructions (Signed)
Take the Levaquin if you fail to improve over the next few days (this still may be viral).  Follow up closely with Dr. Lacinda Axon   Take care  Dr. Lacinda Axon

## 2016-09-14 NOTE — Progress Notes (Signed)
Subjective:  Patient ID: Tracy Frey, female    DOB: [birth year]  Age: 65 y.o. MRN: [id number]  CC: ? Sinus infection  HPI:  65 year old female with hyperlipidemia and allergies presents with concerns for sinus infection.  Patient states that she's been sick since Sunday. She states it's her symptoms started with sore throat. It has now progressed to significant sinus pressure and congestion. She's also had some chest congestion and cough. She's taken Zyrtec and Sudafed without symptom improvement. She states that last night her symptoms worsened and were particular severe. She states that she's had a low-grade temperature. She has not had any true fever. No known exacerbating factors. No other complaints at this time.  Social Hx   Social History   Social History  . Marital status: Married    Spouse name: N/A  . Number of children: 2  . Years of education: N/A   Occupational History  .  [employer]   Social History Main Topics  . Smoking status: Never Smoker  . Smokeless tobacco: Never Used  . Alcohol use 0.0 oz/week     Comment: ocassionally  . Drug use: No  . Sexual activity: Not Asked   Other Topics Concern  . None   Social History Narrative  . None   Review of Systems  Constitutional: Positive for fatigue.  HENT: Positive for congestion, postnasal drip, sinus pressure and sore throat.   Respiratory: Positive for cough.    Objective:  BP (!) 144/84 (BP Location: Right Arm, Patient Position: Sitting, Cuff Size: Normal)   Pulse (!) 104   Temp 98.6 F (37 C) (Oral)   Wt 115 lb 8 oz (52.4 kg)   SpO2 98%   BMI 20.14 kg/m   BP/Weight 09/14/2016 07/19/2015 [id number]  Systolic BP [id number]  Diastolic BP 84 74 70  Wt. (Lbs) 115.5 119.4 118  BMI 20.14 20.82 20.73   Physical Exam  Constitutional: She is oriented to person, place, and time. She appears well-developed. No distress.  HENT:  Oropharynx with erythema. Cobblestoning noted from postnasal drip. Normal  TMs bilaterally. Severe maxillary sinus tenderness to palpation.  Cardiovascular: Regular rhythm.  Tachycardia present.   Pulmonary/Chest: Effort normal. She has no wheezes. She has no rales.  Neurological: She is alert and oriented to person, place, and time.  Psychiatric: She has a normal mood and affect.  Vitals reviewed.  Lab Results  Component Value Date   WBC 4.9 07/12/2014   HGB 14.9 07/12/2014   HCT 44.1 07/12/2014   PLT 245.0 07/12/2014   GLUCOSE 104 (H) 03/30/2016   CHOL 248 (H) 03/30/2016   TRIG 130.0 03/30/2016   HDL 53.20 03/30/2016   LDLDIRECT 168.1 04/17/2013   LDLCALC 169 (H) 03/30/2016   ALT 17 03/30/2016   AST 12 03/30/2016   NA 138 03/30/2016   K 3.9 03/30/2016   CL 101 03/30/2016   CREATININE 0.76 03/30/2016   BUN 20 03/30/2016   CO2 32 03/30/2016   TSH 2.39 07/12/2014   HGBA1C 5.9 03/30/2016   Assessment & Plan:   Problem List Items Addressed This Visit    Acute sinusitis    New acute problem. Treating empirically with Levaquin (patient preference).      Relevant Medications   levofloxacin (LEVAQUIN) 500 MG tablet    Other Visit Diagnoses   None.    Meds ordered this encounter  Medications  . levofloxacin (LEVAQUIN) 500 MG tablet    Sig: Take 1 tablet (500 mg total) by  mouth daily.    Dispense:  7 tablet    Refill:  0    Follow-up: PRN  [place]

## 2016-09-14 NOTE — Assessment & Plan Note (Addendum)
New acute problem. Treating empirically with Levaquin (patient preference).

## 2016-09-14 NOTE — Progress Notes (Signed)
Pre visit review using our clinic review tool, if applicable. No additional management support is needed unless otherwise documented below in the visit note. 

## 2016-09-25 ENCOUNTER — Other Ambulatory Visit: Payer: Self-pay

## 2016-09-25 ENCOUNTER — Telehealth: Payer: Self-pay | Admitting: Internal Medicine

## 2016-09-25 MED ORDER — FLUTICASONE PROPIONATE 50 MCG/ACT NA SUSP: 2.0000 | Freq: Every day | NASAL | 3 refills | Status: DC

## 2016-09-25 NOTE — Telephone Encounter (Signed)
PT called requesting a refill on fluticasone (FLONASE) 50 MCG/ACT nasal spray. Thank you!  Pharmacy - [id number] - [place], [place]  Call pt @ [phone]

## 2016-10-01 ENCOUNTER — Ambulatory Visit (INDEPENDENT_AMBULATORY_CARE_PROVIDER_SITE_OTHER): Payer: BLUE CROSS/BLUE SHIELD

## 2016-10-01 DIAGNOSIS — Z23 Encounter for immunization: Secondary | ICD-10-CM | POA: Diagnosis not present

## 2016-10-03 ENCOUNTER — Other Ambulatory Visit: Payer: Self-pay | Admitting: Internal Medicine

## 2016-10-03 DIAGNOSIS — Z1231 Encounter for screening mammogram for malignant neoplasm of breast: Secondary | ICD-10-CM

## 2016-11-07 ENCOUNTER — Ambulatory Visit
Admission: RE | Admit: 2016-11-07 | Discharge: 2016-11-07 | Disposition: A | Discharge status: Home / Self Care | Payer: BLUE CROSS/BLUE SHIELD | Source: Ambulatory Visit | Attending: Internal Medicine | Admitting: Internal Medicine

## 2016-11-07 DIAGNOSIS — Z1231 Encounter for screening mammogram for malignant neoplasm of breast: Secondary | ICD-10-CM

## 2016-12-27 ENCOUNTER — Other Ambulatory Visit: Payer: BLUE CROSS/BLUE SHIELD

## 2017-01-01 ENCOUNTER — Encounter: Payer: BLUE CROSS/BLUE SHIELD | Admitting: Internal Medicine

## 2017-03-05 ENCOUNTER — Other Ambulatory Visit: Payer: Self-pay | Admitting: Internal Medicine

## 2017-03-05 ENCOUNTER — Telehealth: Payer: Self-pay | Admitting: Radiology

## 2017-03-05 DIAGNOSIS — E7800 Pure hypercholesterolemia, unspecified: Secondary | ICD-10-CM

## 2017-03-05 DIAGNOSIS — R739 Hyperglycemia, unspecified: Secondary | ICD-10-CM

## 2017-03-05 NOTE — Telephone Encounter (Signed)
Pt coming in for labs tomorrow, please place future orders. Thank you.  

## 2017-03-05 NOTE — Progress Notes (Signed)
Order placed for labs.

## 2017-03-05 NOTE — Telephone Encounter (Signed)
Order placed for labs.

## 2017-03-06 ENCOUNTER — Other Ambulatory Visit (INDEPENDENT_AMBULATORY_CARE_PROVIDER_SITE_OTHER): Payer: BLUE CROSS/BLUE SHIELD

## 2017-03-06 ENCOUNTER — Encounter: Payer: Self-pay | Admitting: Internal Medicine

## 2017-03-06 DIAGNOSIS — E7800 Pure hypercholesterolemia, unspecified: Secondary | ICD-10-CM

## 2017-03-06 DIAGNOSIS — R739 Hyperglycemia, unspecified: Secondary | ICD-10-CM | POA: Diagnosis not present

## 2017-03-06 LAB — CBC WITH DIFFERENTIAL/PLATELET
BASOS ABS: 0 10*3/uL (ref 0.0–0.1)
Basophils Relative: 1 % (ref 0.0–3.0)
EOS ABS: 0.1 10*3/uL (ref 0.0–0.7)
Eosinophils Relative: 2.5 % (ref 0.0–5.0)
HEMATOCRIT: 46.2 % — AB (ref 36.0–46.0)
HEMOGLOBIN: 15.6 g/dL — AB (ref 12.0–15.0)
LYMPHS PCT: 28.4 % (ref 12.0–46.0)
Lymphs Abs: 1.2 10*3/uL (ref 0.7–4.0)
MCHC: 33.8 g/dL (ref 30.0–36.0)
MCV: 93.9 fl (ref 78.0–100.0)
MONOS PCT: 10.4 % (ref 3.0–12.0)
Monocytes Absolute: 0.5 10*3/uL (ref 0.1–1.0)
Neutro Abs: 2.5 10*3/uL (ref 1.4–7.7)
Neutrophils Relative %: 57.7 % (ref 43.0–77.0)
Platelets: 254 10*3/uL (ref 150.0–400.0)
RBC: 4.92 Mil/uL (ref 3.87–5.11)
RDW: 12.4 % (ref 11.5–15.5)
WBC: 4.4 10*3/uL (ref 4.0–10.5)

## 2017-03-06 LAB — HEPATIC FUNCTION PANEL
ALBUMIN: 4.6 g/dL (ref 3.5–5.2)
ALT: 18 U/L (ref 0–35)
AST: 12 U/L (ref 0–37)
Alkaline Phosphatase: 42 U/L (ref 39–117)
BILIRUBIN DIRECT: 0.1 mg/dL (ref 0.0–0.3)
TOTAL PROTEIN: 6.8 g/dL (ref 6.0–8.3)
Total Bilirubin: 1 mg/dL (ref 0.2–1.2)

## 2017-03-06 LAB — LIPID PANEL
CHOL/HDL RATIO: 5
CHOLESTEROL: 266 mg/dL — AB (ref 0–200)
HDL: 58.3 mg/dL (ref >39.00)
LDL Cholesterol: 187 mg/dL — ABNORMAL HIGH (ref 0–99)
NonHDL: 207.23
TRIGLYCERIDES: 100 mg/dL (ref 0.0–149.0)
VLDL: 20 mg/dL (ref 0.0–40.0)

## 2017-03-06 LAB — BASIC METABOLIC PANEL
BUN: 16 mg/dL (ref 6–23)
CHLORIDE: 102 meq/L (ref 96–112)
CO2: 33 meq/L — AB (ref 19–32)
Calcium: 10.2 mg/dL (ref 8.4–10.5)
Creatinine, Ser: 0.84 mg/dL (ref 0.40–1.20)
GFR: 72.45 mL/min (ref >60.00)
GLUCOSE: 104 mg/dL — AB (ref 70–99)
POTASSIUM: 4.3 meq/L (ref 3.5–5.1)
SODIUM: 141 meq/L (ref 135–145)

## 2017-03-06 LAB — HEMOGLOBIN A1C: Hgb A1c MFr Bld: 5.9 % (ref 4.6–6.5)

## 2017-03-06 LAB — TSH: TSH: 2.28 u[IU]/mL (ref 0.35–4.50)

## 2017-03-12 ENCOUNTER — Encounter: Payer: Self-pay | Admitting: Internal Medicine

## 2017-03-12 ENCOUNTER — Ambulatory Visit (INDEPENDENT_AMBULATORY_CARE_PROVIDER_SITE_OTHER): Payer: BLUE CROSS/BLUE SHIELD | Admitting: Internal Medicine

## 2017-03-12 VITALS — BP 136/82 | HR 73 | Temp 98.6°F | Resp 12 | Ht 63.58 in | Wt 119.0 lb

## 2017-03-12 DIAGNOSIS — F439 Reaction to severe stress, unspecified: Secondary | ICD-10-CM | POA: Diagnosis not present

## 2017-03-12 DIAGNOSIS — E7800 Pure hypercholesterolemia, unspecified: Secondary | ICD-10-CM

## 2017-03-12 DIAGNOSIS — Z0000 Encounter for general adult medical examination without abnormal findings: Secondary | ICD-10-CM | POA: Diagnosis not present

## 2017-03-12 NOTE — Assessment & Plan Note (Addendum)
Physical today 03/12/17.  Mammogram 11/13/16 - Birads I.  s/p hysterectomy.  Last colonoscopy 2008.  Due this year.

## 2017-03-12 NOTE — Progress Notes (Signed)
Pre-visit discussion using our clinic review tool. No additional management support is needed unless otherwise documented below in the visit note.  

## 2017-03-12 NOTE — Progress Notes (Signed)
Patient ID: Tracy Frey, female   DOB: 12/26/1950, 66 y.o.   MRN: [id number]   Subjective:    Patient ID: Tracy Frey, female    DOB: 1951/04/24, 66 y.o.   MRN: [id number]  HPI  Patient here for her physical exam.  She is doing well.  Stays active.  No chest pain.  No sob.  No acid reflux.  No abdominal pain.  Bowels moving.  Discussed her recent lab results.  Discussed cholesterol levels.  Discussed her calculated risk.  She declines cholesterol medication.  Wants to work on diet and exercise.     Past Medical History:  Diagnosis Date  . Hypercholesterolemia   . Seasonal allergies    Past Surgical History:  Procedure Laterality Date  . ABDOMINAL HYSTERECTOMY    . BREAST BIOPSY Right 2003   neg  . PARTIAL HYSTERECTOMY  1988   secondary to fibroids and heavy bleeding and endometriosis  . TONSILLECTOMY     age 89   Family History  Problem Relation Age of Onset  . Hypercholesterolemia Mother   . Hypothyroidism Sister   . Hypercholesterolemia Sister   . Breast cancer Maternal Aunt 80  . Colon cancer Neg Hx    Social History   Social History  . Marital status: Married    Spouse name: N/A  . Number of children: 2  . Years of education: N/A   Occupational History  .  [employer]   Social History Main Topics  . Smoking status: Never Smoker  . Smokeless tobacco: Never Used  . Alcohol use 0.0 oz/week     Comment: ocassionally  . Drug use: No  . Sexual activity: Not Asked   Other Topics Concern  . None   Social History Narrative  . None    Outpatient Encounter Prescriptions as of 03/12/2017  Medication Sig  . Calcium Citrate-Vitamin D (CALCIUM CITRATE + D PO) Take by mouth daily.  . cetirizine (ZYRTEC) 10 MG tablet Take 10 mg by mouth daily.  . Cholecalciferol (VITAMIN D-3) 1000 UNITS CAPS Take by mouth 2 (two) times daily.  . fluticasone (FLONASE) 50 MCG/ACT nasal spray Place 2 sprays into both nostrils daily.  . Glucosamine-Chondroitin (OSTEO BI-FLEX REGULAR  STRENGTH PO) Take 1,500 mg by mouth daily.  . Multiple Vitamins-Minerals (CENTRUM SILVER ADULT 50+) TABS Take by mouth daily.  . Omega-3 Fatty Acids (FISH OIL) 1000 MG CAPS Take by mouth.  Marland Kitchen OVER THE COUNTER MEDICATION Tumeric OTC supplement  . pseudoephedrine (SUDAFED) 30 MG tablet Take 30 mg by mouth every 4 (four) hours as needed for congestion.  Marland Kitchen spironolactone (ALDACTONE) 25 MG tablet Take 25 mg by mouth 2 (two) times daily.  . vitamin E 400 UNIT capsule Take 400 Units by mouth every other day.  . [DISCONTINUED] levofloxacin (LEVAQUIN) 500 MG tablet Take 1 tablet (500 mg total) by mouth daily.   No facility-administered encounter medications on file as of 03/12/2017.     Review of Systems  Constitutional: Negative for appetite change and unexpected weight change.  HENT: Negative for congestion and sinus pressure.   Eyes: Negative for pain and visual disturbance.  Respiratory: Negative for cough, chest tightness and shortness of breath.   Cardiovascular: Negative for chest pain, palpitations and leg swelling.  Gastrointestinal: Negative for abdominal pain, diarrhea, nausea and vomiting.  Genitourinary: Negative for difficulty urinating and dysuria.  Musculoskeletal: Negative for back pain and joint swelling.  Skin: Negative for color change and rash.  Neurological: Negative for dizziness, light-headedness  and headaches.  Hematological: Negative for adenopathy. Does not bruise/bleed easily.  Psychiatric/Behavioral: Negative for agitation and dysphoric mood.       Objective:    Physical Exam  Constitutional: She is oriented to person, place, and time. She appears well-developed and well-nourished. No distress.  HENT:  Nose: Nose normal.  Mouth/Throat: Oropharynx is clear and moist.  Eyes: Right eye exhibits no discharge. Left eye exhibits no discharge. No scleral icterus.  Neck: Neck supple. No thyromegaly present.  Cardiovascular: Normal rate and regular rhythm.     Pulmonary/Chest: Breath sounds normal. No accessory muscle usage. No tachypnea. No respiratory distress. She has no decreased breath sounds. She has no wheezes. She has no rhonchi. Right breast exhibits no inverted nipple, no mass, no nipple discharge and no tenderness (no axillary adenopathy). Left breast exhibits no inverted nipple, no mass, no nipple discharge and no tenderness (no axilarry adenopathy).  Abdominal: Soft. Bowel sounds are normal. There is no tenderness.  Musculoskeletal: She exhibits no edema or tenderness.  Lymphadenopathy:    She has no cervical adenopathy.  Neurological: She is alert and oriented to person, place, and time.  Skin: Skin is warm. No rash noted. No erythema.  Psychiatric: She has a normal mood and affect. Her behavior is normal.    BP 136/82 (BP Location: Left Arm, Patient Position: Sitting, Cuff Size: Normal)   Pulse 73   Temp 98.6 F (37 C) (Oral)   Resp 12   Ht 5' 3.58" (1.615 m)   Wt 119 lb (54 kg)   SpO2 97%   BMI 20.70 kg/m  Wt Readings from Last 3 Encounters:  03/12/17 119 lb (54 kg)  09/14/16 115 lb 8 oz (52.4 kg)  07/19/15 119 lb 6.4 oz (54.2 kg)     Lab Results  Component Value Date   WBC 4.4 03/06/2017   HGB 15.6 (H) 03/06/2017   HCT 46.2 (H) 03/06/2017   PLT 254.0 03/06/2017   GLUCOSE 104 (H) 03/06/2017   CHOL 266 (H) 03/06/2017   TRIG 100.0 03/06/2017   HDL 58.30 03/06/2017   LDLDIRECT 168.1 04/17/2013   LDLCALC 187 (H) 03/06/2017   ALT 18 03/06/2017   AST 12 03/06/2017   NA 141 03/06/2017   K 4.3 03/06/2017   CL 102 03/06/2017   CREATININE 0.84 03/06/2017   BUN 16 03/06/2017   CO2 33 (H) 03/06/2017   TSH 2.28 03/06/2017   HGBA1C 5.9 03/06/2017    Mm Digital Screening Bilateral  Result Date: 11/08/2016 CLINICAL DATA:  Screening. EXAM: DIGITAL SCREENING BILATERAL MAMMOGRAM WITH CAD COMPARISON:  Previous exam(s). ACR Breast Density Category d: The breast tissue is extremely dense, which lowers the sensitivity of  mammography. FINDINGS: There are no findings suspicious for malignancy. Images were processed with CAD. IMPRESSION: No mammographic evidence of malignancy. A result letter of this screening mammogram will be mailed directly to the patient. RECOMMENDATION: Screening mammogram in one year. (Code:SM-B-01Y) BI-RADS CATEGORY  1: Negative. Electronically Signed   By: Ammie Ferrier M.D.   On: 11/08/2016 08:31       Assessment & Plan:   Problem List Items Addressed This Visit    Health care maintenance    Physical today 03/12/17.  Mammogram 11/13/16 - Birads I.  s/p hysterectomy.  Last colonoscopy 2008.  Due this year.        Hypercholesterolemia    Discussed at length with her today.  She declines medication.  Wants to work on diet and exercise.  Follow lipid panel.  Stress    She feels she is handling things relatively well.  Follow.         Other Visit Diagnoses    Routine general medical examination at a health care facility    -  Primary       Einar Pheasant, MD

## 2017-03-18 ENCOUNTER — Encounter: Payer: Self-pay | Admitting: Internal Medicine

## 2017-03-18 NOTE — Assessment & Plan Note (Signed)
She feels she is handling things relatively well.  Follow.

## 2017-03-18 NOTE — Assessment & Plan Note (Signed)
Discussed at length with her today.  She declines medication.  Wants to work on diet and exercise.  Follow lipid panel.

## 2017-04-10 ENCOUNTER — Encounter: Payer: Self-pay | Admitting: Internal Medicine

## 2017-05-03 ENCOUNTER — Ambulatory Visit: Payer: BLUE CROSS/BLUE SHIELD | Admitting: Internal Medicine

## 2017-05-10 ENCOUNTER — Encounter: Payer: Self-pay | Admitting: Internal Medicine

## 2017-05-10 ENCOUNTER — Ambulatory Visit (INDEPENDENT_AMBULATORY_CARE_PROVIDER_SITE_OTHER): Payer: BLUE CROSS/BLUE SHIELD | Admitting: Internal Medicine

## 2017-05-10 DIAGNOSIS — R030 Elevated blood-pressure reading, without diagnosis of hypertension: Secondary | ICD-10-CM | POA: Diagnosis not present

## 2017-05-10 DIAGNOSIS — E7800 Pure hypercholesterolemia, unspecified: Secondary | ICD-10-CM | POA: Diagnosis not present

## 2017-05-10 NOTE — Progress Notes (Signed)
Pre-visit discussion using our clinic review tool. No additional management support is needed unless otherwise documented below in the visit note.  

## 2017-05-10 NOTE — Progress Notes (Signed)
Patient ID: Tracy Frey, female   DOB: May 31, 1951, 66 y.o.   MRN: [id number]   Subjective:    Patient ID: Tracy Frey, female    DOB: 04/13/1951, 66 y.o.   MRN: [id number]  HPI  Patient here for a scheduled follow up.  Here to follow up on her blood pressure.  She brings in outside checks.  Blood pressure averaging 120-130s/70s.  Brought in her cuff.  Appears to correlate relatively well.  Stays active.  Exercising.  No chest pain.  No sob.  No acid reflux.  No abdominal pain or cramping.  Bowels stable.     Past Medical History:  Diagnosis Date  . Hypercholesterolemia   . Seasonal allergies    Past Surgical History:  Procedure Laterality Date  . ABDOMINAL HYSTERECTOMY    . BREAST BIOPSY Right 2003   neg  . PARTIAL HYSTERECTOMY  1988   secondary to fibroids and heavy bleeding and endometriosis  . TONSILLECTOMY     age 32   Family History  Problem Relation Age of Onset  . Hypercholesterolemia Mother   . Hypothyroidism Sister   . Hypercholesterolemia Sister   . Breast cancer Maternal Aunt 80  . Colon cancer Neg Hx    Social History   Social History  . Marital status: Married    Spouse name: N/A  . Number of children: 2  . Years of education: N/A   Occupational History  .  [employer]   Social History Main Topics  . Smoking status: Never Smoker  . Smokeless tobacco: Never Used  . Alcohol use 0.0 oz/week     Comment: ocassionally  . Drug use: No  . Sexual activity: Not Asked   Other Topics Concern  . None   Social History Narrative  . None    Outpatient Encounter Prescriptions as of 05/10/2017  Medication Sig  . Calcium Citrate-Vitamin D (CALCIUM CITRATE + D PO) Take by mouth daily.  . cetirizine (ZYRTEC) 10 MG tablet Take 10 mg by mouth daily.  . Cholecalciferol (VITAMIN D-3) 1000 UNITS CAPS Take by mouth 2 (two) times daily.  . fluticasone (FLONASE) 50 MCG/ACT nasal spray Place 2 sprays into both nostrils daily.  . Glucosamine-Chondroitin (OSTEO BI-FLEX  REGULAR STRENGTH PO) Take 1,500 mg by mouth daily.  . Multiple Vitamins-Minerals (CENTRUM SILVER ADULT 50+) TABS Take by mouth daily.  . Omega-3 Fatty Acids (FISH OIL) 1000 MG CAPS Take by mouth.  Marland Kitchen OVER THE COUNTER MEDICATION Tumeric OTC supplement  . pseudoephedrine (SUDAFED) 30 MG tablet Take 30 mg by mouth every 4 (four) hours as needed for congestion.  Marland Kitchen spironolactone (ALDACTONE) 25 MG tablet Take 25 mg by mouth 2 (two) times daily.  . vitamin E 400 UNIT capsule Take 400 Units by mouth every other day.   No facility-administered encounter medications on file as of 05/10/2017.     Review of Systems  Constitutional: Negative for appetite change and unexpected weight change.  HENT: Negative for congestion and sinus pressure.   Respiratory: Negative for cough, chest tightness and shortness of breath.   Cardiovascular: Negative for chest pain, palpitations and leg swelling.  Gastrointestinal: Negative for abdominal pain, diarrhea, nausea and vomiting.  Genitourinary: Negative for difficulty urinating and dysuria.  Musculoskeletal: Negative for joint swelling.  Skin: Negative for color change and rash.  Neurological: Negative for dizziness, light-headedness and headaches.  Psychiatric/Behavioral: Negative for agitation and dysphoric mood.       Objective:    Physical Exam  Constitutional: She  appears well-developed and well-nourished. No distress.  HENT:  Nose: Nose normal.  Mouth/Throat: Oropharynx is clear and moist.  Neck: Neck supple. No thyromegaly present.  Cardiovascular: Normal rate and regular rhythm.   Pulmonary/Chest: Breath sounds normal. No respiratory distress. She has no wheezes.  Abdominal: Soft. Bowel sounds are normal. There is no tenderness.  Musculoskeletal: She exhibits no edema or tenderness.  Lymphadenopathy:    She has no cervical adenopathy.  Skin: No rash noted. No erythema.  Psychiatric: She has a normal mood and affect. Her behavior is normal.     BP 132/68 (BP Location: Left Arm, Patient Position: Sitting, Cuff Size: Normal)   Pulse 81   Temp 98.2 F (36.8 C) (Oral)   Resp 12   Ht 5\' 4"  (1.626 m)   Wt 120 lb (54.4 kg)   SpO2 98%   BMI 20.60 kg/m  Wt Readings from Last 3 Encounters:  05/10/17 120 lb (54.4 kg)  03/12/17 119 lb (54 kg)  09/14/16 115 lb 8 oz (52.4 kg)     Lab Results  Component Value Date   WBC 4.4 03/06/2017   HGB 15.6 (H) 03/06/2017   HCT 46.2 (H) 03/06/2017   PLT 254.0 03/06/2017   GLUCOSE 104 (H) 03/06/2017   CHOL 266 (H) 03/06/2017   TRIG 100.0 03/06/2017   HDL 58.30 03/06/2017   LDLDIRECT 168.1 04/17/2013   LDLCALC 187 (H) 03/06/2017   ALT 18 03/06/2017   AST 12 03/06/2017   NA 141 03/06/2017   K 4.3 03/06/2017   CL 102 03/06/2017   CREATININE 0.84 03/06/2017   BUN 16 03/06/2017   CO2 33 (H) 03/06/2017   TSH 2.28 03/06/2017   HGBA1C 5.9 03/06/2017    Mm Digital Screening Bilateral  Result Date: 11/08/2016 CLINICAL DATA:  Screening. EXAM: DIGITAL SCREENING BILATERAL MAMMOGRAM WITH CAD COMPARISON:  Previous exam(s). ACR Breast Density Category d: The breast tissue is extremely dense, which lowers the sensitivity of mammography. FINDINGS: There are no findings suspicious for malignancy. Images were processed with CAD. IMPRESSION: No mammographic evidence of malignancy. A result letter of this screening mammogram will be mailed directly to the patient. RECOMMENDATION: Screening mammogram in one year. (Code:SM-B-01Y) BI-RADS CATEGORY  1: Negative. Electronically Signed   By: Ammie Ferrier M.D.   On: 11/08/2016 08:31       Assessment & Plan:   Problem List Items Addressed This Visit    Elevated blood pressure reading    Elevated blood pressure on check here.  Her cuff appears to correlate relatively well.  Hold on medication.  Her outside checks are ok.  Continue to monitor.  Send in readings.        Hypercholesterolemia    She is watching her diet.  Exercising.  Follow lipid  panel.            Einar Pheasant, MD

## 2017-05-12 ENCOUNTER — Encounter: Payer: Self-pay | Admitting: Internal Medicine

## 2017-05-12 DIAGNOSIS — R030 Elevated blood-pressure reading, without diagnosis of hypertension: Secondary | ICD-10-CM | POA: Insufficient documentation

## 2017-05-12 NOTE — Assessment & Plan Note (Signed)
Elevated blood pressure on check here.  Her cuff appears to correlate relatively well.  Hold on medication.  Her outside checks are ok.  Continue to monitor.  Send in readings.

## 2017-05-12 NOTE — Assessment & Plan Note (Signed)
She is watching her diet.  Exercising.  Follow lipid panel.

## 2017-07-17 ENCOUNTER — Ambulatory Visit (INDEPENDENT_AMBULATORY_CARE_PROVIDER_SITE_OTHER): Payer: BLUE CROSS/BLUE SHIELD

## 2017-07-17 DIAGNOSIS — Z23 Encounter for immunization: Secondary | ICD-10-CM

## 2017-07-17 NOTE — Progress Notes (Signed)
Patient presents for Tdap injection. Injected right deltoid.  Patient tolerated injection well.

## 2017-10-11 ENCOUNTER — Encounter: Payer: Self-pay | Admitting: Internal Medicine

## 2017-11-12 ENCOUNTER — Ambulatory Visit: Payer: BLUE CROSS/BLUE SHIELD

## 2017-11-19 ENCOUNTER — Ambulatory Visit (INDEPENDENT_AMBULATORY_CARE_PROVIDER_SITE_OTHER): Payer: PPO

## 2017-11-19 DIAGNOSIS — Z23 Encounter for immunization: Secondary | ICD-10-CM | POA: Diagnosis not present

## 2017-12-16 ENCOUNTER — Other Ambulatory Visit: Payer: Self-pay | Admitting: Internal Medicine

## 2017-12-16 DIAGNOSIS — Z1231 Encounter for screening mammogram for malignant neoplasm of breast: Secondary | ICD-10-CM

## 2017-12-30 ENCOUNTER — Ambulatory Visit
Admission: RE | Admit: 2017-12-30 | Discharge: 2017-12-30 | Disposition: A | Discharge status: Home / Self Care | Payer: PPO | Source: Ambulatory Visit | Attending: Internal Medicine | Admitting: Internal Medicine

## 2017-12-30 DIAGNOSIS — Z1231 Encounter for screening mammogram for malignant neoplasm of breast: Secondary | ICD-10-CM | POA: Diagnosis not present

## 2018-01-20 ENCOUNTER — Encounter: Payer: Self-pay | Admitting: Internal Medicine

## 2018-01-20 ENCOUNTER — Ambulatory Visit: Payer: Self-pay | Admitting: *Deleted

## 2018-01-20 NOTE — Telephone Encounter (Signed)
Pt states she has pressure with urinating and has also noted blood in her urine occurring at approximately 2pm today. Pt describes the urine as being  pinkish-yellowish  In color.Pt also having complaints of urinary frequency, burning and a temperature of 99.9. Pt asking if she would be able to drop off a urine sample without scheduling an appt or if something could be called in.  Pt states she is going out of town to [place] and would not be able to come in for an appt. Advised pt that she could try to use an evisit or go to Urgent Care to further evaluation of current symptoms. Pt states she will email Dr. Nicki Reaper to discuss symptoms and to see if she would be willing to call in medication. Pt states if she does not get a response she will call back in the morning.  Reason for Disposition . Pain or burning with passing urine  Answer Assessment - Initial Assessment Questions 1. COLOR of URINE: "Describe the color of the urine."  (e.g., tea-colored, pink, red, blood clots, bloody)     Pinkish yellow 2. ONSET: "When did the bleeding start?"      Around 2pm 3. EPISODES: "How many times has there been blood in the urine?" or "How many times today?"     Since 2pm, unable to determine amount of times 4. PAIN with URINATION: "Is there any pain with passing your urine?" If so, ask: "How bad is the pain?"  (Scale 1-10; or mild, moderate, severe)    - MILD - complains slightly about urination hurting    - MODERATE - interferes with normal activities      - SEVERE - excruciating, unwilling or unable to urinate because of the pain      Pressure, with using the backroom 5. FEVER: "Do you have a fever?" If so, ask: "What is your temperature, how was it measured, and when did it start?"     99.9- 1730 6. ASSOCIATED SYMPTOMS: "Are you passing urine more frequently than usual?"     yes 7. OTHER SYMPTOMS: "Do you have any other symptoms?" (e.g., back/flank pain, abdominal pain, vomiting)     Abdominal pain,  8.  PREGNANCY: "Is there any chance you are pregnant?" "When was your last menstrual period?"     Hx of hysterctomy  Protocols used: URINE - BLOOD IN-A-AH

## 2018-01-21 ENCOUNTER — Encounter: Payer: Self-pay | Admitting: Internal Medicine

## 2018-01-21 ENCOUNTER — Encounter: Payer: Self-pay | Admitting: Family Medicine

## 2018-01-21 ENCOUNTER — Ambulatory Visit (INDEPENDENT_AMBULATORY_CARE_PROVIDER_SITE_OTHER): Payer: PPO | Admitting: Family Medicine

## 2018-01-21 ENCOUNTER — Other Ambulatory Visit: Payer: Self-pay

## 2018-01-21 VITALS — BP 110/70 | HR 90 | Temp 98.0°F | Wt 118.8 lb

## 2018-01-21 DIAGNOSIS — N390 Urinary tract infection, site not specified: Secondary | ICD-10-CM | POA: Insufficient documentation

## 2018-01-21 DIAGNOSIS — R300 Dysuria: Secondary | ICD-10-CM

## 2018-01-21 DIAGNOSIS — N3001 Acute cystitis with hematuria: Secondary | ICD-10-CM | POA: Diagnosis not present

## 2018-01-21 LAB — POCT URINALYSIS DIPSTICK
Bilirubin, UA: NEGATIVE
Glucose, UA: NEGATIVE
Ketones, UA: NEGATIVE
Nitrite, UA: NEGATIVE
PH UA: 5.5 (ref 5.0–8.0)
PROTEIN UA: POSITIVE
RBC UA: POSITIVE
Spec Grav, UA: 1.025 (ref 1.010–1.025)
UROBILINOGEN UA: 0.2 U/dL

## 2018-01-21 LAB — URINALYSIS, MICROSCOPIC ONLY

## 2018-01-21 MED ORDER — CEPHALEXIN 500 MG PO CAPS: 500.0000 mg | ORAL_CAPSULE | Freq: Two times a day (BID) | ORAL | 0 refills | Status: DC

## 2018-01-21 NOTE — Telephone Encounter (Signed)
Seen by Dr. Caryl Bis

## 2018-01-21 NOTE — Assessment & Plan Note (Signed)
Symptoms and UA concerning for UTI.  No true fever or systemic symptoms or abdominal pain.  Will treat with Keflex.  Send urine for culture and microscopy.  Given return precautions.

## 2018-01-21 NOTE — Patient Instructions (Signed)
Nice to see you. We will start you on Keflex for UTI. If your symptoms worsen or you develop abdominal pain or fevers please be evaluated again.

## 2018-01-21 NOTE — Progress Notes (Signed)
  Tommi Rumps, MD Phone: [phone]  Tracy Frey is a 67 y.o. female who presents today for same-day visit.  Patient notes onset of symptoms yesterday.  Has dysuria, hematuria, urinary frequency and urgency.  No abdominal pain.  Notes a heavy sensation in her suprapubic region when she urinates.  Minimal back pain.  No nausea or vomiting.  No diarrhea.  No vaginal discharge.  Notes a temperature of 99.9 F yesterday.  She has not been drinking as much water recently.  Social History   Tobacco Use  Smoking Status Never Smoker  Smokeless Tobacco Never Used     ROS see history of present illness  Objective  Physical Exam Vitals:   01/21/18 0853  BP: 110/70  Pulse: 90  Temp: 98 F (36.7 C)  SpO2: 99%    BP Readings from Last 3 Encounters:  01/21/18 110/70  05/10/17 132/68  03/12/17 136/82   Wt Readings from Last 3 Encounters:  01/21/18 118 lb 12.8 oz (53.9 kg)  05/10/17 120 lb (54.4 kg)  03/12/17 119 lb (54 kg)    Physical Exam  Constitutional: No distress.  Cardiovascular: Normal rate, regular rhythm and normal heart sounds.  Pulmonary/Chest: Effort normal and breath sounds normal.  Abdominal: Soft. Bowel sounds are normal. She exhibits no distension. There is no tenderness. There is no rebound and no guarding.  No CVA tenderness  Musculoskeletal: She exhibits no edema.  Neurological: She is alert. Gait normal.  Skin: Skin is warm and dry. She is not diaphoretic.     Assessment/Plan: Please see individual problem list.  UTI (urinary tract infection) Symptoms and UA concerning for UTI.  No true fever or systemic symptoms or abdominal pain.  Will treat with Keflex.  Send urine for culture and microscopy.  Given return precautions.   Orders Placed This Encounter  Procedures  . Urine Culture  . Urine Microscopic Only  . POCT Urinalysis Dipstick    Meds ordered this encounter  Medications  . cephALEXin (KEFLEX) 500 MG capsule    Sig: Take 1  capsule (500 mg total) by mouth 2 (two) times daily.    Dispense:  14 capsule    Refill:  0     Tommi Rumps, MD [place]

## 2018-01-24 LAB — URINE CULTURE
MICRO NUMBER:: 90218229
SPECIMEN QUALITY:: ADEQUATE

## 2018-03-31 DIAGNOSIS — Z1211 Encounter for screening for malignant neoplasm of colon: Secondary | ICD-10-CM | POA: Diagnosis not present

## 2018-04-11 ENCOUNTER — Encounter: Payer: Self-pay | Admitting: Family

## 2018-04-11 ENCOUNTER — Ambulatory Visit (INDEPENDENT_AMBULATORY_CARE_PROVIDER_SITE_OTHER): Payer: PPO | Admitting: Family

## 2018-04-11 VITALS — BP 126/80 | HR 90 | Temp 98.7°F | Resp 16 | Wt 117.1 lb

## 2018-04-11 DIAGNOSIS — R591 Generalized enlarged lymph nodes: Secondary | ICD-10-CM

## 2018-04-11 DIAGNOSIS — H9203 Otalgia, bilateral: Secondary | ICD-10-CM

## 2018-04-11 MED ORDER — AMOXICILLIN 500 MG PO CAPS: 500.0000 mg | ORAL_CAPSULE | Freq: Two times a day (BID) | ORAL | 0 refills | Status: DC

## 2018-04-11 MED ORDER — FLUTICASONE PROPIONATE 50 MCG/ACT NA SUSP: 2.0000 | Freq: Every day | NASAL | 3 refills | Status: DC

## 2018-04-11 NOTE — Progress Notes (Signed)
Bilateral ear irrigation performed.  Right ear and left ear irrigated.  Wax removed successfully.  Patient tolerated procedure well with no complaints.

## 2018-04-11 NOTE — Progress Notes (Signed)
Subjective:    Patient ID: Tracy Frey, female    DOB: 1951-01-02, 67 y.o.   MRN: [id number]  CC: Tracy Frey is a 67 y.o. female who presents today for an acute visit.    HPI:  Complains of bilateral ear pain, occasional productive cough 4 days, worsening  Endorses fatigue, post nasal drainage. Notes enlarged lymph node which started a couple of days ago, non tender. Usually gets an enlarged lymph node when sick.  States starting to have sinus pressure. Notes 3 nights ago was very cold.   Tmax 100.6 last night. Took mucinex with some help.    Notes traditional has 'sinus and allergies'. Takes zyrtec QD.   No lung disease.     HISTORY:  Past Medical History:  Diagnosis Date  . Hypercholesterolemia   . Seasonal allergies    Past Surgical History:  Procedure Laterality Date  . ABDOMINAL HYSTERECTOMY    . BREAST EXCISIONAL BIOPSY Right 2003   neg  . PARTIAL HYSTERECTOMY  1988   secondary to fibroids and heavy bleeding and endometriosis  . TONSILLECTOMY     age 75   Family History  Problem Relation Age of Onset  . Hypercholesterolemia Mother   . Hypothyroidism Sister   . Hypercholesterolemia Sister   . Breast cancer Maternal Aunt 80  . Colon cancer Neg Hx     Allergies: Chocolate and Sulfa antibiotics Current Outpatient Medications on File Prior to Visit  Medication Sig Dispense Refill  . Calcium Citrate-Vitamin D (CALCIUM CITRATE + D PO) Take by mouth daily.    . cetirizine (ZYRTEC) 10 MG tablet Take 10 mg by mouth daily.    . Cholecalciferol (VITAMIN D-3) 1000 UNITS CAPS Take by mouth 2 (two) times daily.    . fluticasone (FLONASE) 50 MCG/ACT nasal spray Place 2 sprays into both nostrils daily. 16 g 3  . Glucosamine-Chondroitin (OSTEO BI-FLEX REGULAR STRENGTH PO) Take 1,500 mg by mouth daily.    . Multiple Vitamins-Minerals (CENTRUM SILVER ADULT 50+) TABS Take by mouth daily.    . Omega-3 Fatty Acids (FISH OIL) 1000 MG CAPS Take by mouth.    Marland Kitchen OVER THE  COUNTER MEDICATION Tumeric OTC supplement    . Probiotic Product (PROBIOTIC-10 PO) Take by mouth daily.    . pseudoephedrine (SUDAFED) 30 MG tablet Take 30 mg by mouth every 4 (four) hours as needed for congestion.    Marland Kitchen spironolactone (ALDACTONE) 25 MG tablet Take 25 mg by mouth 2 (two) times daily.    . vitamin E 400 UNIT capsule Take 400 Units by mouth every other day.     No current facility-administered medications on file prior to visit.     Social History   Tobacco Use  . Smoking status: Never Smoker  . Smokeless tobacco: Never Used  Substance Use Topics  . Alcohol use: Yes    Alcohol/week: 0.0 oz    Comment: ocassionally  . Drug use: No    Review of Systems  Constitutional: Negative for chills and fever.  HENT: Positive for ear pain, sinus pressure and voice change. Negative for congestion, ear discharge, hearing loss, sore throat and trouble swallowing.   Respiratory: Positive for cough. Negative for shortness of breath and wheezing.   Cardiovascular: Negative for chest pain and palpitations.  Gastrointestinal: Negative for nausea and vomiting.      Objective:    BP 126/80 (BP Location: Left Arm, Patient Position: Sitting, Cuff Size: Normal)   Pulse 90   Temp 98.7  F (37.1 C) (Oral)   Resp 16   Wt 117 lb 2 oz (53.1 kg)   SpO2 98%   BMI 20.10 kg/m    Physical Exam  Constitutional: She appears well-developed and well-nourished.  HENT:  Head: Normocephalic and atraumatic.  Right Ear: Hearing, external ear and ear canal normal. No drainage, swelling or tenderness. No foreign bodies. Tympanic membrane is erythematous (mild). Tympanic membrane is not bulging. No middle ear effusion. No decreased hearing is noted.  Left Ear: Hearing, external ear and ear canal normal. No drainage, swelling or tenderness. No foreign bodies. Tympanic membrane is erythematous (mild). Tympanic membrane is not bulging.  No middle ear effusion. No decreased hearing is noted.  Nose: Nose  normal. No rhinorrhea. Right sinus exhibits no maxillary sinus tenderness and no frontal sinus tenderness. Left sinus exhibits no maxillary sinus tenderness and no frontal sinus tenderness.  Mouth/Throat: Uvula is midline, oropharynx is clear and moist and mucous membranes are normal. No oropharyngeal exudate, posterior oropharyngeal edema, posterior oropharyngeal erythema or tonsillar abscesses.  Eyes: Conjunctivae are normal.  Cardiovascular: Regular rhythm, normal heart sounds and normal pulses.  Pulmonary/Chest: Effort normal and breath sounds normal. She has no wheezes. She has no rhonchi. She has no rales.  Lymphadenopathy:       Head (right side): Submandibular adenopathy present. No submental, no tonsillar, no preauricular, no posterior auricular and no occipital adenopathy present.       Head (left side): No submental, no submandibular, no tonsillar, no preauricular, no posterior auricular and no occipital adenopathy present.    She has no cervical adenopathy.       Right cervical: No superficial cervical, no deep cervical and no posterior cervical adenopathy present.      Left cervical: No superficial cervical, no deep cervical and no posterior cervical adenopathy present.  2 cm enlarged lymph noted appreicated over submandibular area. Non tender, non fluctuant. No increased warmth. No erythema  Neurological: She is alert.  Skin: Skin is warm and dry.  Psychiatric: She has a normal mood and affect. Her speech is normal and behavior is normal. Thought content normal.  Vitals reviewed.  Bilateral cerumen impactions, resolved with irrigation by CMA in both left and right ear.  After which, I also examined patient and the EAC's and TM's are clear. Patient tolerated procedure well.     Assessment & Plan:   1. Lymphadenopathy As discussed with patient, suspect reactive. Patient will closely monitor and let me know if doesn't resolve in a couple of weeks. She verbalized understanding of the  importance of this.   2. Otalgia of both ears She is well appearing. Afebrile. Non toxic in appearance. Duation 4 days, suspect viral or allergic in etiology however advised patient if not better after 1-2 conservative treatment to start antibiotic. Cerumen impaction resolved. Patient will let me know how she is doing.     I have discontinued Truman Hayward A. Cannata's cephALEXin. I am also having her maintain her spironolactone, pseudoephedrine, cetirizine, Calcium Citrate-Vitamin D (CALCIUM CITRATE + D PO), Vitamin D-3, vitamin E, CENTRUM SILVER ADULT 50+, Glucosamine-Chondroitin (OSTEO BI-FLEX REGULAR STRENGTH PO), Fish Oil, OVER THE COUNTER MEDICATION, fluticasone, and Probiotic Product (PROBIOTIC-10 PO).   No orders of the defined types were placed in this encounter.   Return precautions given.   Risks, benefits, and alternatives of the medications and treatment plan prescribed today were discussed, and patient expressed understanding.   Education regarding symptom management and diagnosis given to patient on AVS.  Continue  to follow with Einar Pheasant, MD for routine health maintenance.   Tracy Frey and I agreed with plan.   Mable Paris, FNP

## 2018-04-11 NOTE — Patient Instructions (Addendum)
I suspect that your infection is viral in nature.  As discussed, I advise that you wait to fill the antibiotic after 1-2 days of symptom management to see if your symptoms improve. If you do not show improvement, you may take the antibiotic as prescribed.   Continue to monitor your enlarged lymph node on right side of neck.  As we discussed, it is very likely that this enlarged lymph node is a reactive lymph node and result of recent viral or bacterial illness. It should be self limiting and resolve on its own. If it does not resolve in a 2-4 weeks, please return for re-evaluation.   Also return if node increases size, redness, warmth, becomes tender, or if it becomes immovable and/or hard. Also, return to the clinic for any increased bruising or easy bleeding, weight loss, or night sweats.   Do not use Q-tips in the ear. Use Hydrogen Peroxide or Debrox drops weekly as needed for cerumen build up. Never stick anything into the ear canal. Return to clinic if thin puss drains out or if foul odor comes from ear.

## 2018-06-25 ENCOUNTER — Ambulatory Visit
Admission: RE | Admit: 2018-06-25 | Discharge: 2018-06-25 | Disposition: A | Discharge status: Home / Self Care | Payer: PPO | Source: Ambulatory Visit | Attending: Unknown Physician Specialty | Admitting: Unknown Physician Specialty

## 2018-06-25 ENCOUNTER — Encounter: Payer: Self-pay | Admitting: Anesthesiology

## 2018-06-25 ENCOUNTER — Ambulatory Visit: Payer: PPO | Admitting: Anesthesiology

## 2018-06-25 ENCOUNTER — Encounter
Admission: RE | Disposition: A | Discharge status: Home / Self Care | Payer: Self-pay | Source: Ambulatory Visit | Attending: Unknown Physician Specialty

## 2018-06-25 DIAGNOSIS — K5790 Diverticulosis of intestine, part unspecified, without perforation or abscess without bleeding: Secondary | ICD-10-CM | POA: Diagnosis not present

## 2018-06-25 DIAGNOSIS — K640 First degree hemorrhoids: Secondary | ICD-10-CM | POA: Diagnosis not present

## 2018-06-25 DIAGNOSIS — Z79899 Other long term (current) drug therapy: Secondary | ICD-10-CM | POA: Insufficient documentation

## 2018-06-25 DIAGNOSIS — Z1211 Encounter for screening for malignant neoplasm of colon: Secondary | ICD-10-CM | POA: Diagnosis not present

## 2018-06-25 DIAGNOSIS — Z7951 Long term (current) use of inhaled steroids: Secondary | ICD-10-CM | POA: Insufficient documentation

## 2018-06-25 DIAGNOSIS — Z7982 Long term (current) use of aspirin: Secondary | ICD-10-CM | POA: Diagnosis not present

## 2018-06-25 DIAGNOSIS — K5730 Diverticulosis of large intestine without perforation or abscess without bleeding: Secondary | ICD-10-CM | POA: Insufficient documentation

## 2018-06-25 HISTORY — PX: COLONOSCOPY WITH PROPOFOL: SHX5780

## 2018-06-25 LAB — HM COLONOSCOPY

## 2018-06-25 SURGERY — COLONOSCOPY WITH PROPOFOL
Anesthesia: General

## 2018-06-25 MED ORDER — PROPOFOL 10 MG/ML IV BOLUS
INTRAVENOUS | Status: DC | PRN
  Administered 2018-06-25: 60 mg via INTRAVENOUS

## 2018-06-25 MED ORDER — SODIUM CHLORIDE 0.9 % IV SOLN
INTRAVENOUS | Status: DC
  Administered 2018-06-25: 1000 mL via INTRAVENOUS

## 2018-06-25 MED ORDER — PROPOFOL 500 MG/50ML IV EMUL
INTRAVENOUS | Status: AC
Start: 2018-06-25 — End: ?
  Filled 2018-06-25: qty 50

## 2018-06-25 MED ORDER — PROPOFOL 500 MG/50ML IV EMUL
INTRAVENOUS | Status: DC | PRN
  Administered 2018-06-25: 110 ug/kg/min via INTRAVENOUS

## 2018-06-25 MED ORDER — LIDOCAINE HCL (PF) 1 % IJ SOLN
2.0000 mL | Freq: Once | INTRAMUSCULAR | Status: AC
  Administered 2018-06-25: 0.3 mL via INTRADERMAL

## 2018-06-25 MED ORDER — SODIUM CHLORIDE 0.9 % IV SOLN: INTRAVENOUS | Status: DC

## 2018-06-25 NOTE — H&P (Signed)
Primary Care Physician:  Einar Pheasant, MD Primary Gastroenterologist:  Dr. Vira Agar  Pre-Procedure History & Physical: HPI:  Tracy Frey is a 67 y.o. female is here for a screening  colonoscopy.   Past Medical History:  Diagnosis Date  . Hypercholesterolemia   . Seasonal allergies     Past Surgical History:  Procedure Laterality Date  . ABDOMINAL HYSTERECTOMY    . BREAST EXCISIONAL BIOPSY Right 2003   neg  . PARTIAL HYSTERECTOMY  1988   secondary to fibroids and heavy bleeding and endometriosis  . TONSILLECTOMY     age 49    Prior to Admission medications   Medication Sig Start Date End Date Taking? Authorizing Provider  aspirin EC 81 MG tablet Take 81 mg by mouth daily.   Yes [provider]  amoxicillin (AMOXIL) 500 MG capsule Take 1 capsule (500 mg total) by mouth 2 (two) times daily. 04/11/18   Burnard Hawthorne, FNP  Calcium Citrate-Vitamin D (CALCIUM CITRATE + D PO) Take by mouth daily.    [provider]  cetirizine (ZYRTEC) 10 MG tablet Take 10 mg by mouth daily.    [provider]  Cholecalciferol (VITAMIN D-3) 1000 UNITS CAPS Take by mouth 2 (two) times daily.    [provider]  fluticasone (FLONASE) 50 MCG/ACT nasal spray Place 2 sprays into both nostrils daily. 04/11/18   Burnard Hawthorne, FNP  Glucosamine-Chondroitin (OSTEO BI-FLEX REGULAR STRENGTH PO) Take 1,500 mg by mouth daily.    [provider]  Multiple Vitamins-Minerals (CENTRUM SILVER ADULT 50+) TABS Take by mouth daily.    [provider]  Omega-3 Fatty Acids (FISH OIL) 1000 MG CAPS Take by mouth.    [provider]  OVER THE COUNTER MEDICATION Tumeric OTC supplement    [provider]  Probiotic Product (PROBIOTIC-10 PO) Take by mouth daily.    [provider]  pseudoephedrine (SUDAFED) 30 MG tablet Take 30 mg by mouth every 4 (four) hours as needed for congestion.    [provider]  spironolactone  (ALDACTONE) 25 MG tablet Take 25 mg by mouth 2 (two) times daily.    [provider]  vitamin E 400 UNIT capsule Take 400 Units by mouth every other day.    [provider]    Allergies as of 06/24/2018 - Review Complete 06/24/2018  Allergen Reaction Noted  . Chocolate  03/04/2013  . Sulfa antibiotics Hives 02/17/2013    Family History  Problem Relation Age of Onset  . Hypercholesterolemia Mother   . Hypothyroidism Sister   . Hypercholesterolemia Sister   . Breast cancer Maternal Aunt 80  . Colon cancer Neg Hx     Social History   Socioeconomic History  . Marital status: Married    Spouse name: Not on file  . Number of children: 2  . Years of education: Not on file  . Highest education level: Not on file  Occupational History    Employer: [employer]  Social Needs  . Financial resource strain: Not on file  . Food insecurity:    Worry: Not on file    Inability: Not on file  . Transportation needs:    Medical: Not on file    Non-medical: Not on file  Tobacco Use  . Smoking status: Never Smoker  . Smokeless tobacco: Never Used  Substance and Sexual Activity  . Alcohol use: Yes    Alcohol/week: 0.0 oz    Comment: ocassionally  . Drug use: No  .  Sexual activity: Not on file  Lifestyle  . Physical activity:    Days per week: Not on file    Minutes per session: Not on file  . Stress: Not on file  Relationships  . Social connections:    Talks on phone: Not on file    Gets together: Not on file    Attends religious service: Not on file    Active member of club or organization: Not on file    Attends meetings of clubs or organizations: Not on file    Relationship status: Not on file  . Intimate partner violence:    Fear of current or ex partner: Not on file    Emotionally abused: Not on file    Physically abused: Not on file    Forced sexual activity: Not on file  Other Topics Concern  . Not on file  Social History Narrative  . Not on file     Review of Systems: See HPI, otherwise negative ROS  Physical Exam: BP (!) 141/79   Pulse 90   Temp (!) 96 F (35.6 C) (Tympanic)   Resp 17   Ht 5\' 4"  (1.626 m)   Wt 53.1 kg (117 lb)   SpO2 100%   BMI 20.08 kg/m  General:   Alert,  pleasant and cooperative in NAD Head:  Normocephalic and atraumatic. Neck:  Supple; no masses or thyromegaly. Lungs:  Clear throughout to auscultation.    Heart:  Regular rate and rhythm. Abdomen:  Soft, nontender and nondistended. Normal bowel sounds, without guarding, and without rebound.   Neurologic:  Alert and  oriented x4;  grossly normal neurologically.  Impression/Plan: Tracy Frey is here for an colonoscopy to be performed for screening colonoscopy.  Risks, benefits, limitations, and alternatives regarding  colonoscopy have been reviewed with the patient.  Questions have been answered.  All parties agreeable.   Gaylyn Cheers, MD  06/25/2018, 10:13 AM

## 2018-06-25 NOTE — Anesthesia Preprocedure Evaluation (Signed)
Anesthesia Evaluation  Patient identified by MRN, date of birth, ID band Patient awake    Reviewed: Allergy & Precautions, NPO status , Patient's Chart, lab work & pertinent test results  Airway Mallampati: II  TM Distance: <3 FB     Dental   Pulmonary  Seasonal allergies   Pulmonary exam normal        Cardiovascular negative cardio ROS Normal cardiovascular exam     Neuro/Psych negative neurological ROS     GI/Hepatic negative GI ROS, Neg liver ROS,   Endo/Other  negative endocrine ROS  Renal/GU negative Renal ROS  negative genitourinary   Musculoskeletal negative musculoskeletal ROS (+)   Abdominal Normal abdominal exam  (+)   Peds negative pediatric ROS (+)  Hematology negative hematology ROS (+)   Anesthesia Other Findings   Reproductive/Obstetrics                             Anesthesia Physical Anesthesia Plan  ASA: II  Anesthesia Plan: General   Post-op Pain Management:    Induction: Intravenous  PONV Risk Score and Plan: TIVA  Airway Management Planned:   Additional Equipment:   Intra-op Plan:   Post-operative Plan:   Informed Consent: I have reviewed the patients History and Physical, chart, labs and discussed the procedure including the risks, benefits and alternatives for the proposed anesthesia with the patient or authorized representative who has indicated his/her understanding and acceptance.   Dental advisory given  Plan Discussed with: CRNA and Surgeon  Anesthesia Plan Comments:         Anesthesia Quick Evaluation

## 2018-06-25 NOTE — Op Note (Signed)
[facility]astroenter[facility] Patient Name: Tracy Frey Procedure Date: 06/25/2018 9:56 AM MRN: [id number] Account #: [id number] Date of Birth: 06/24/1951 Admit Type: Outpatient Age: 67 Room: [facility] ENDO ROOM 3 Gender: Female Note Status: Finalized Procedure:            Colonoscopy Indications:          Screening for colorectal malignant neoplasm Providers:            Manya Silvas, MD Referring MD:         Einar Pheasant, MD (Referring MD) Medicines:            Propofol per Anesthesia Complications:        No immediate complications. Procedure:            Pre-Anesthesia Assessment:                       - After reviewing the risks and benefits, the patient                        was deemed in satisfactory condition to undergo the                        procedure.                       After obtaining informed consent, the colonoscope was                        passed under direct vision. Throughout the procedure,                        the patient's blood pressure, pulse, and oxygen                        saturations were monitored continuously. The                        Colonoscope was introduced through the anus and                        advanced to the the cecum, identified by appendiceal                        orifice and ileocecal valve. The colonoscopy was                        somewhat difficult due to significant looping and a                        tortuous colon. Successful completion of the procedure                        was aided by applying abdominal pressure. The patient                        tolerated the procedure well. The quality of the bowel                        preparation was excellent. Findings:      A single small-mouthed diverticulum was found in the sigmoid colon.  Internal hemorrhoids were found during endoscopy. The hemorrhoids were       small and Grade I (internal hemorrhoids that do not prolapse).      The exam was  otherwise without abnormality. Impression:           - Diverticulosis in the sigmoid colon.                       - Internal hemorrhoids.                       - The examination was otherwise normal.                       - No specimens collected. Recommendation:       - Repeat colonoscopy in 10 years for screening purposes. Manya Silvas, MD 06/25/2018 10:46:37 AM This report has been signed electronically. Number of Addenda: 0 Note Initiated On: 06/25/2018 9:56 AM Scope Withdrawal Time: 0 hours 9 minutes 8 seconds  Total Procedure Duration: 0 hours 20 minutes 22 seconds       [facility]

## 2018-06-25 NOTE — Anesthesia Post-op Follow-up Note (Signed)
Anesthesia QCDR form completed.        

## 2018-06-25 NOTE — Transfer of Care (Signed)
Immediate Anesthesia Transfer of Care Note  Patient: Abbigayle Toole  Procedure(s) Performed: COLONOSCOPY WITH PROPOFOL (N/A )  Patient Location: PACU  Anesthesia Type:General  Level of Consciousness: drowsy  Airway & Oxygen Therapy: Patient Spontanous Breathing and Patient connected to nasal cannula oxygen  Post-op Assessment: Report given to RN and Post -op Vital signs reviewed and stable  Post vital signs: Reviewed and stable  Last Vitals:  Vitals Value Taken Time  BP 124/74 06/25/2018 10:46 AM  Temp 35.9 C 06/25/2018 10:46 AM  Pulse 72 06/25/2018 10:46 AM  Resp 19 06/25/2018 10:46 AM  SpO2 100 % 06/25/2018 10:46 AM  Vitals shown include unvalidated device data.  Last Pain:  Vitals:   06/25/18 1046  TempSrc: Tympanic  PainSc:          Complications: No apparent anesthesia complications

## 2018-06-26 ENCOUNTER — Encounter: Payer: Self-pay | Admitting: Unknown Physician Specialty

## 2018-06-26 NOTE — Anesthesia Postprocedure Evaluation (Signed)
Anesthesia Post Note  Patient: Tracy Frey  Procedure(s) Performed: COLONOSCOPY WITH PROPOFOL (N/A )  Patient location during evaluation: PACU Anesthesia Type: General Level of consciousness: awake and alert and oriented Pain management: pain level controlled Vital Signs Assessment: post-procedure vital signs reviewed and stable Respiratory status: spontaneous breathing Cardiovascular status: blood pressure returned to baseline Anesthetic complications: no     Last Vitals:  Vitals:   06/25/18 1046 06/25/18 1116  BP: 124/74   Pulse:    Resp:    Temp: (!) 35.9 C   SpO2:  100%    Last Pain:  Vitals:   06/25/18 1046  TempSrc: Tympanic  PainSc:                  [name]

## 2018-07-31 DIAGNOSIS — L821 Other seborrheic keratosis: Secondary | ICD-10-CM | POA: Diagnosis not present

## 2018-07-31 DIAGNOSIS — L700 Acne vulgaris: Secondary | ICD-10-CM | POA: Diagnosis not present

## 2018-07-31 DIAGNOSIS — D225 Melanocytic nevi of trunk: Secondary | ICD-10-CM | POA: Diagnosis not present

## 2018-09-23 ENCOUNTER — Ambulatory Visit (INDEPENDENT_AMBULATORY_CARE_PROVIDER_SITE_OTHER): Payer: PPO

## 2018-09-23 DIAGNOSIS — Z23 Encounter for immunization: Secondary | ICD-10-CM | POA: Diagnosis not present

## 2018-09-26 ENCOUNTER — Ambulatory Visit: Payer: PPO

## 2018-10-09 ENCOUNTER — Telehealth: Payer: Self-pay | Admitting: Internal Medicine

## 2018-10-09 NOTE — Telephone Encounter (Signed)
OK for patient to come in for Hep A or should she go to travel clinic for all injections?

## 2018-10-09 NOTE — Telephone Encounter (Signed)
Please advise 

## 2018-10-09 NOTE — Telephone Encounter (Signed)
Copied from [place] [phone]. Topic: Quick Communication - Rx Refill/Question >> Oct 09, 2018  2:37 PM Scherrie Gerlach wrote: Medication: Hep A  inj Typhoid pills  Pt is going to the [place], [place] and [place] in Feb 2020, and the CDC is recommending this 2 things.  Is it ok for the pt to come in for the Hep A?  [id number] [address], [place] [phone] (Phone) [phone] (Fax)

## 2018-10-09 NOTE — Telephone Encounter (Signed)
I do not mind her coming in for Hep A injection, but usually what we recommend is visit with travel clinic for updated recommendations and they recommend injections needed and medication needed.  (would recommend travel clinic)

## 2018-10-10 NOTE — Telephone Encounter (Signed)
LMTCB

## 2018-11-06 NOTE — Telephone Encounter (Signed)
LMTCB

## 2018-12-26 ENCOUNTER — Encounter: Payer: PPO | Admitting: Internal Medicine

## 2019-01-11 ENCOUNTER — Encounter: Payer: Self-pay | Admitting: Internal Medicine

## 2019-01-12 NOTE — Telephone Encounter (Signed)
I can see her today at 4:00 for work in for this.

## 2019-01-12 NOTE — Telephone Encounter (Signed)
Patient declined appt. She said she is on a limited time before they leave. Confirmed that she is doing okay but she does not want to come in the office and chance getting sick in the office. She is wanting the abx as a precautionary since she is going out of the country for 2 weeks. She apologized for not being able to come in but would appreciate if something could be sent in.

## 2019-01-13 MED ORDER — AZITHROMYCIN 250 MG PO TABS: ORAL_TABLET | ORAL | 0 refills | Status: DC

## 2019-01-13 NOTE — Telephone Encounter (Signed)
You called this patient yesterday

## 2019-01-13 NOTE — Telephone Encounter (Signed)
See other my chart message.  zpak sent in.

## 2019-01-13 NOTE — Telephone Encounter (Signed)
Pt called to speak with Dr. Nicki Reaper. Pt states she has no fever and has drainage. Called about a medication to have sent in for sinus infection. / please advise

## 2019-04-02 ENCOUNTER — Encounter: Payer: PPO | Admitting: Internal Medicine

## 2019-04-22 ENCOUNTER — Other Ambulatory Visit: Payer: Self-pay | Admitting: Internal Medicine

## 2019-04-22 DIAGNOSIS — Z1231 Encounter for screening mammogram for malignant neoplasm of breast: Secondary | ICD-10-CM

## 2019-05-08 ENCOUNTER — Other Ambulatory Visit: Payer: Self-pay | Admitting: Internal Medicine

## 2019-05-08 ENCOUNTER — Ambulatory Visit
Admission: RE | Admit: 2019-05-08 | Discharge: 2019-05-08 | Disposition: A | Discharge status: Home / Self Care | Payer: PPO | Source: Ambulatory Visit | Attending: Internal Medicine | Admitting: Internal Medicine

## 2019-05-08 ENCOUNTER — Other Ambulatory Visit: Payer: Self-pay

## 2019-05-08 DIAGNOSIS — R928 Other abnormal and inconclusive findings on diagnostic imaging of breast: Secondary | ICD-10-CM

## 2019-05-08 DIAGNOSIS — Z1231 Encounter for screening mammogram for malignant neoplasm of breast: Secondary | ICD-10-CM

## 2019-05-08 DIAGNOSIS — N6489 Other specified disorders of breast: Secondary | ICD-10-CM

## 2019-05-14 ENCOUNTER — Ambulatory Visit
Admission: RE | Admit: 2019-05-14 | Discharge: 2019-05-14 | Disposition: A | Discharge status: Home / Self Care | Payer: PPO | Source: Ambulatory Visit | Attending: Internal Medicine | Admitting: Internal Medicine

## 2019-05-14 ENCOUNTER — Other Ambulatory Visit: Payer: Self-pay

## 2019-05-14 DIAGNOSIS — R928 Other abnormal and inconclusive findings on diagnostic imaging of breast: Secondary | ICD-10-CM | POA: Diagnosis not present

## 2019-05-14 DIAGNOSIS — N6489 Other specified disorders of breast: Secondary | ICD-10-CM | POA: Insufficient documentation

## 2019-05-14 DIAGNOSIS — R922 Inconclusive mammogram: Secondary | ICD-10-CM | POA: Diagnosis not present

## 2019-08-04 ENCOUNTER — Ambulatory Visit (INDEPENDENT_AMBULATORY_CARE_PROVIDER_SITE_OTHER): Payer: PPO | Admitting: Internal Medicine

## 2019-08-04 ENCOUNTER — Other Ambulatory Visit: Payer: Self-pay

## 2019-08-04 ENCOUNTER — Ambulatory Visit: Payer: PPO

## 2019-08-04 ENCOUNTER — Encounter: Payer: Self-pay | Admitting: Internal Medicine

## 2019-08-04 VITALS — BP 138/78 | HR 83 | Temp 95.6°F | Resp 16 | Ht 64.0 in | Wt 114.6 lb

## 2019-08-04 DIAGNOSIS — Z23 Encounter for immunization: Secondary | ICD-10-CM

## 2019-08-04 DIAGNOSIS — E2839 Other primary ovarian failure: Secondary | ICD-10-CM | POA: Diagnosis not present

## 2019-08-04 DIAGNOSIS — R739 Hyperglycemia, unspecified: Secondary | ICD-10-CM | POA: Diagnosis not present

## 2019-08-04 DIAGNOSIS — F439 Reaction to severe stress, unspecified: Secondary | ICD-10-CM | POA: Diagnosis not present

## 2019-08-04 DIAGNOSIS — H9203 Otalgia, bilateral: Secondary | ICD-10-CM | POA: Diagnosis not present

## 2019-08-04 DIAGNOSIS — E7800 Pure hypercholesterolemia, unspecified: Secondary | ICD-10-CM | POA: Diagnosis not present

## 2019-08-04 DIAGNOSIS — Z0000 Encounter for general adult medical examination without abnormal findings: Secondary | ICD-10-CM | POA: Diagnosis not present

## 2019-08-04 MED ORDER — FLUTICASONE PROPIONATE 50 MCG/ACT NA SUSP: 2.0000 | Freq: Every day | NASAL | 3 refills | Status: DC

## 2019-08-04 NOTE — Progress Notes (Signed)
Patient ID: Tracy Frey, female   DOB: May 14, 1951, 69 y.o.   MRN: [id number]   Subjective:    Patient ID: Tracy Frey, female    DOB: [birth year], 67 y.o.   MRN: [id number]  HPI  Patient here for her physical exam.  She reports she is doing relatively well.  Increased stress with some family issues.  Discussed with her today.  She feels she is handling things relatively well.  Staying active.  No chest pain.  No sob.  No acid reflux.  No abdominal pain.  Bowels moving.  Discussed the need for bone density and f/u mammogram.  Also discussed the need for prevnar.  She will call to schedule prevnar.     Past Medical History:  Diagnosis Date   Hypercholesterolemia    Seasonal allergies    Past Surgical History:  Procedure Laterality Date   ABDOMINAL HYSTERECTOMY     BREAST EXCISIONAL BIOPSY Right 2003   neg   COLONOSCOPY WITH PROPOFOL N/A 06/25/2018   Procedure: COLONOSCOPY WITH PROPOFOL;  Surgeon: Manya Silvas, MD;  Location[facility] ENDOSCOPY;  Service: Endoscopy;  Laterality: N/A;   PARTIAL HYSTERECTOMY  1988   secondary to fibroids and heavy bleeding and endometriosis   TONSILLECTOMY     age 10   Family History  Problem Relation Age of Onset   Hypercholesterolemia Mother    Hypothyroidism Sister    Hypercholesterolemia Sister    Breast cancer Maternal Aunt 80   Colon cancer Neg Hx    Social History   Socioeconomic History   Marital status: Married    Spouse name: Not on file   Number of children: 2   Years of education: Not on file   Highest education level: Not on file  Occupational History    Employer: [employer]  Social Needs   Financial resource strain: Not on file   Food insecurity    Worry: Not on file    Inability: Not on file   Transportation needs    Medical: Not on file    Non-medical: Not on file  Tobacco Use   Smoking status: Never Smoker   Smokeless tobacco: Never Used  Substance and Sexual Activity   Alcohol use: Yes   Alcohol/week: 0.0 standard drinks    Comment: ocassionally   Drug use: No   Sexual activity: Not on file  Lifestyle   Physical activity    Days per week: Not on file    Minutes per session: Not on file   Stress: Not on file  Relationships   Social connections    Talks on phone: Not on file    Gets together: Not on file    Attends religious service: Not on file    Active member of club or organization: Not on file    Attends meetings of clubs or organizations: Not on file    Relationship status: Not on file  Other Topics Concern   Not on file  Social History Narrative   Not on file    Outpatient Encounter Medications as of 08/04/2019  Medication Sig   aspirin EC 81 MG tablet Take 81 mg by mouth daily.   Calcium Citrate-Vitamin D (CALCIUM CITRATE + D PO) Take by mouth daily.   cetirizine (ZYRTEC) 10 MG tablet Take 10 mg by mouth daily.   Cholecalciferol (VITAMIN D-3) 1000 UNITS CAPS Take by mouth 2 (two) times daily.   fluticasone (FLONASE) 50 MCG/ACT nasal spray Place 2 sprays into both nostrils daily.  Glucosamine-Chondroitin (OSTEO BI-FLEX REGULAR STRENGTH PO) Take 1,500 mg by mouth daily.   Multiple Vitamins-Minerals (CENTRUM SILVER ADULT 50+) TABS Take by mouth daily.   Omega-3 Fatty Acids (FISH OIL) 1000 MG CAPS Take by mouth.   OVER THE COUNTER MEDICATION Tumeric OTC supplement   Probiotic Product (PROBIOTIC-10 PO) Take by mouth daily.   spironolactone (ALDACTONE) 50 MG tablet Take 50 mg by mouth daily.   [DISCONTINUED] amoxicillin (AMOXIL) 500 MG capsule Take 1 capsule (500 mg total) by mouth 2 (two) times daily.   [DISCONTINUED] azithromycin (ZITHROMAX) 250 MG tablet Take 2 tablets x 1 day and then one tablet per day for four days.   [DISCONTINUED] fluticasone (FLONASE) 50 MCG/ACT nasal spray Place 2 sprays into both nostrils daily.   [DISCONTINUED] pseudoephedrine (SUDAFED) 30 MG tablet Take 30 mg by mouth every 4 (four) hours as needed for  congestion.   [DISCONTINUED] spironolactone (ALDACTONE) 25 MG tablet Take 25 mg by mouth 2 (two) times daily.   [DISCONTINUED] vitamin E 400 UNIT capsule Take 400 Units by mouth every other day.   No facility-administered encounter medications on file as of 08/04/2019.     Review of Systems  Constitutional: Negative for appetite change and unexpected weight change.  HENT: Negative for congestion and sinus pressure.   Eyes: Negative for pain and visual disturbance.  Respiratory: Negative for cough, chest tightness and shortness of breath.   Cardiovascular: Negative for chest pain, palpitations and leg swelling.  Gastrointestinal: Negative for abdominal pain, diarrhea, nausea and vomiting.  Genitourinary: Negative for difficulty urinating and dysuria.  Musculoskeletal: Negative for joint swelling and myalgias.  Skin: Negative for color change and rash.  Neurological: Negative for dizziness, light-headedness and headaches.  Hematological: Negative for adenopathy. Does not bruise/bleed easily.  Psychiatric/Behavioral: Negative for agitation and dysphoric mood.       Objective:    Physical Exam Constitutional:      General: She is not in acute distress.    Appearance: Normal appearance. She is well-developed.  HENT:     Right Ear: External ear normal.     Left Ear: External ear normal.  Eyes:     General: No scleral icterus.       Right eye: No discharge.        Left eye: No discharge.     Conjunctiva/sclera: Conjunctivae normal.  Neck:     Musculoskeletal: Neck supple. No muscular tenderness.     Thyroid: No thyromegaly.  Cardiovascular:     Rate and Rhythm: Normal rate and regular rhythm.  Pulmonary:     Effort: No tachypnea, accessory muscle usage or respiratory distress.     Breath sounds: Normal breath sounds. No decreased breath sounds or wheezing.  Chest:     Breasts:        Right: No inverted nipple, mass, nipple discharge or tenderness (no axillary adenopathy).         Left: No inverted nipple, mass, nipple discharge or tenderness (no axilarry adenopathy).  Abdominal:     General: Bowel sounds are normal.     Palpations: Abdomen is soft.     Tenderness: There is no abdominal tenderness.  Musculoskeletal:        General: No swelling or tenderness.  Lymphadenopathy:     Cervical: No cervical adenopathy.  Skin:    Findings: No erythema or rash.  Neurological:     Mental Status: She is alert and oriented to person, place, and time.  Psychiatric:  Mood and Affect: Mood normal.        Behavior: Behavior normal.     BP 138/78    Pulse 83    Temp (!) 95.6 F (35.3 C) (Temporal)    Resp 16    Ht 5\' 4"  (1.626 m)    Wt 114 lb 9.6 oz (52 kg)    SpO2 98%    BMI 19.67 kg/m  Wt Readings from Last 3 Encounters:  08/04/19 114 lb 9.6 oz (52 kg)  06/25/18 117 lb (53.1 kg)  04/11/18 117 lb 2 oz (53.1 kg)     Lab Results  Component Value Date   WBC 4.4 03/06/2017   HGB 15.6 (H) 03/06/2017   HCT 46.2 (H) 03/06/2017   PLT 254.0 03/06/2017   GLUCOSE 104 (H) 03/06/2017   CHOL 266 (H) 03/06/2017   TRIG 100.0 03/06/2017   HDL 58.30 03/06/2017   LDLDIRECT 168.1 04/17/2013   LDLCALC 187 (H) 03/06/2017   ALT 18 03/06/2017   AST 12 03/06/2017   NA 141 03/06/2017   K 4.3 03/06/2017   CL 102 03/06/2017   CREATININE 0.84 03/06/2017   BUN 16 03/06/2017   CO2 33 (H) 03/06/2017   TSH 2.28 03/06/2017   HGBA1C 5.9 03/06/2017    Mm Diag Breast Tomo Bilateral  Result Date: 05/14/2019 CLINICAL DATA:  The patient returns after screening study for evaluation of possible bilateral breast asymmetries. EXAM: DIGITAL DIAGNOSTIC BILATERAL MAMMOGRAM WITH CAD AND TOMO COMPARISON:  05/08/2019 and earlier ACR Breast Density Category c: The breast tissue is heterogeneously dense, which may obscure small masses. FINDINGS: Additional 2-D and 3-D images are performed. These views show no persistent abnormality in the LATERAL portion of the RIGHT breast. No suspicious mass,  distortion, or microcalcifications are identified to suggest presence of malignancy. Additional views of the LATERAL portion of the LEFT breast show no persistent abnormality. Mammographic images were processed with CAD. IMPRESSION: No mammographic evidence for malignancy. RECOMMENDATION: Screening mammogram in one year.(Code:SM-B-01Y) I have discussed the findings and recommendations with the patient. Results were also provided in writing at the conclusion of the visit. If applicable, a reminder letter will be sent to the patient regarding the next appointment. BI-RADS CATEGORY  1: Negative. Electronically Signed   By: Nolon Nations M.D.   On: 05/14/2019 11:57       Assessment & Plan:   Problem List Items Addressed This Visit    Health care maintenance    Physical today 08/04/19.  Colonoscopy 06/25/18 - diverticulosis, internal hemorrhoids, otherwise normal.  Recommended f/u colonoscopy in 10 years.  Schedule mammogram and bone density.        Hypercholesterolemia    Follow lipid panel.        Relevant Medications   spironolactone (ALDACTONE) 50 MG tablet   Other Relevant Orders   CBC with Differential/Platelet   Comprehensive metabolic panel   Lipid panel   TSH   Stress    Increased stress as outlined.  Discussed with her today.  Has good support.  Follow.  Does not feel needs any further intervention.         Other Visit Diagnoses    Estrogen deficiency    -  Primary   Relevant Orders   DG Bone Density   Otalgia of both ears       Relevant Medications   fluticasone (FLONASE) 50 MCG/ACT nasal spray   Need for immunization against influenza       Relevant Orders   Flu  Vaccine QUAD High Dose(Fluad) (Completed)   Hyperglycemia       Relevant Orders   Hemoglobin A1c       Einar Pheasant, MD

## 2019-08-09 ENCOUNTER — Encounter: Payer: Self-pay | Admitting: Internal Medicine

## 2019-08-09 NOTE — Assessment & Plan Note (Signed)
Increased stress as outlined.  Discussed with her today.  Has good support.  Follow.  Does not feel needs any further intervention.

## 2019-08-09 NOTE — Assessment & Plan Note (Signed)
Physical today 08/04/19.  Colonoscopy 06/25/18 - diverticulosis, internal hemorrhoids, otherwise normal.  Recommended f/u colonoscopy in 10 years.  Schedule mammogram and bone density.

## 2019-08-09 NOTE — Assessment & Plan Note (Signed)
Follow lipid panel.   

## 2019-08-19 DIAGNOSIS — D2271 Melanocytic nevi of right lower limb, including hip: Secondary | ICD-10-CM | POA: Diagnosis not present

## 2019-08-19 DIAGNOSIS — D225 Melanocytic nevi of trunk: Secondary | ICD-10-CM | POA: Diagnosis not present

## 2019-08-19 DIAGNOSIS — D2262 Melanocytic nevi of left upper limb, including shoulder: Secondary | ICD-10-CM | POA: Diagnosis not present

## 2019-08-19 DIAGNOSIS — D2261 Melanocytic nevi of right upper limb, including shoulder: Secondary | ICD-10-CM | POA: Diagnosis not present

## 2019-08-19 DIAGNOSIS — L700 Acne vulgaris: Secondary | ICD-10-CM | POA: Diagnosis not present

## 2019-08-19 DIAGNOSIS — D2272 Melanocytic nevi of left lower limb, including hip: Secondary | ICD-10-CM | POA: Diagnosis not present

## 2019-09-03 ENCOUNTER — Encounter: Payer: Self-pay | Admitting: Internal Medicine

## 2019-09-03 ENCOUNTER — Other Ambulatory Visit: Payer: Self-pay

## 2019-09-03 ENCOUNTER — Other Ambulatory Visit (INDEPENDENT_AMBULATORY_CARE_PROVIDER_SITE_OTHER): Payer: PPO

## 2019-09-03 DIAGNOSIS — R739 Hyperglycemia, unspecified: Secondary | ICD-10-CM | POA: Diagnosis not present

## 2019-09-03 DIAGNOSIS — E7800 Pure hypercholesterolemia, unspecified: Secondary | ICD-10-CM

## 2019-09-03 LAB — LIPID PANEL
Cholesterol: 254 mg/dL — ABNORMAL HIGH (ref 0–200)
HDL: 61.4 mg/dL (ref >39.00)
LDL Cholesterol: 176 mg/dL — ABNORMAL HIGH (ref 0–99)
NonHDL: 192.47
Total CHOL/HDL Ratio: 4
Triglycerides: 82 mg/dL (ref 0.0–149.0)
VLDL: 16.4 mg/dL (ref 0.0–40.0)

## 2019-09-03 LAB — CBC WITH DIFFERENTIAL/PLATELET
Basophils Absolute: 0 10*3/uL (ref 0.0–0.1)
Basophils Relative: 1 % (ref 0.0–3.0)
Eosinophils Absolute: 0.1 10*3/uL (ref 0.0–0.7)
Eosinophils Relative: 2.6 % (ref 0.0–5.0)
HCT: 44.1 % (ref 36.0–46.0)
Hemoglobin: 14.9 g/dL (ref 12.0–15.0)
Lymphocytes Relative: 24.7 % (ref 12.0–46.0)
Lymphs Abs: 1.1 10*3/uL (ref 0.7–4.0)
MCHC: 33.8 g/dL (ref 30.0–36.0)
MCV: 94.9 fl (ref 78.0–100.0)
Monocytes Absolute: 0.5 10*3/uL (ref 0.1–1.0)
Monocytes Relative: 11.4 % (ref 3.0–12.0)
Neutro Abs: 2.6 10*3/uL (ref 1.4–7.7)
Neutrophils Relative %: 60.3 % (ref 43.0–77.0)
Platelets: 229 10*3/uL (ref 150.0–400.0)
RBC: 4.65 Mil/uL (ref 3.87–5.11)
RDW: 12.6 % (ref 11.5–15.5)
WBC: 4.3 10*3/uL (ref 4.0–10.5)

## 2019-09-03 LAB — HEMOGLOBIN A1C: Hgb A1c MFr Bld: 6 % (ref 4.6–6.5)

## 2019-09-03 LAB — COMPREHENSIVE METABOLIC PANEL
ALT: 21 U/L (ref 0–35)
AST: 15 U/L (ref 0–37)
Albumin: 4.4 g/dL (ref 3.5–5.2)
Alkaline Phosphatase: 37 U/L — ABNORMAL LOW (ref 39–117)
BUN: 15 mg/dL (ref 6–23)
CO2: 30 mEq/L (ref 19–32)
Calcium: 10.4 mg/dL (ref 8.4–10.5)
Chloride: 103 mEq/L (ref 96–112)
Creatinine, Ser: 0.77 mg/dL (ref 0.40–1.20)
GFR: 74.79 mL/min (ref >60.00)
Glucose, Bld: 95 mg/dL (ref 70–99)
Potassium: 3.6 mEq/L (ref 3.5–5.1)
Sodium: 142 mEq/L (ref 135–145)
Total Bilirubin: 1.2 mg/dL (ref 0.2–1.2)
Total Protein: 6.8 g/dL (ref 6.0–8.3)

## 2019-09-03 LAB — TSH: TSH: 2.34 u[IU]/mL (ref 0.35–4.50)

## 2019-09-05 ENCOUNTER — Encounter: Payer: Self-pay | Admitting: Internal Medicine

## 2019-09-08 ENCOUNTER — Other Ambulatory Visit: Payer: PPO

## 2019-09-22 ENCOUNTER — Encounter: Payer: Self-pay | Admitting: Internal Medicine

## 2019-09-23 ENCOUNTER — Encounter: Payer: Self-pay | Admitting: Internal Medicine

## 2019-09-23 ENCOUNTER — Telehealth: Payer: Self-pay

## 2019-09-23 NOTE — Telephone Encounter (Signed)
I would recommend crestor 10mg .  If she is hesitant about starting, we can even start 10mg  q Monday, Wednesday and Friday.  Will need liver panel checked 6 weeks after starting.  Also, I saw her previous phone message.  If she needs me to call her, I can or can set up appt if she desires.  Just let me know.

## 2019-09-23 NOTE — Telephone Encounter (Signed)
What cholesterol medication are you wanting me to send in for her?

## 2019-09-23 NOTE — Telephone Encounter (Signed)
Left message for patient to call back and schedule appointments.

## 2019-09-23 NOTE — Telephone Encounter (Signed)
Copied from [place] [phone]. Topic: General - Other >> Sep 23, 2019 10:32 AM Pauline Good wrote: Reason for CRM: pt stated to go ahead and order the new medication for cholesterol and sent it to [facility]

## 2019-09-24 ENCOUNTER — Other Ambulatory Visit: Payer: Self-pay

## 2019-09-24 MED ORDER — ROSUVASTATIN CALCIUM 10 MG PO TABS: 10.0000 mg | ORAL_TABLET | Freq: Every day | ORAL | 1 refills | Status: DC

## 2019-09-24 NOTE — Telephone Encounter (Signed)
Crestor sent in. Patient is going to try to take it daily. Has phone visit on Monday and would like to keep.

## 2019-09-28 ENCOUNTER — Ambulatory Visit (INDEPENDENT_AMBULATORY_CARE_PROVIDER_SITE_OTHER): Payer: PPO | Admitting: Internal Medicine

## 2019-09-28 DIAGNOSIS — F439 Reaction to severe stress, unspecified: Secondary | ICD-10-CM

## 2019-09-28 DIAGNOSIS — R739 Hyperglycemia, unspecified: Secondary | ICD-10-CM | POA: Diagnosis not present

## 2019-09-28 DIAGNOSIS — E7800 Pure hypercholesterolemia, unspecified: Secondary | ICD-10-CM | POA: Diagnosis not present

## 2019-09-28 NOTE — Progress Notes (Signed)
Patient ID: Tracy Frey, female   DOB: November 18, 1951, 68 y.o.   MRN: [id number]   Virtual Visit via telephone Note  This visit type was conducted due to national recommendations for restrictions regarding the COVID-19 pandemic (e.g. social distancing).  This format is felt to be most appropriate for this patient at this time.  All issues noted in this document were discussed and addressed.  No physical exam was performed (except for noted visual exam findings with Video Visits).   I connected with [name] by telephone and verified that I am speaking with the correct person using two identifiers. Location patient: home Location provider: work Persons participating in the telephone visit: patient, provider  I discussed the limitations, risks, security and privacy concerns of performing an evaluation and management service by telephone and the availability of in person appointments.  The patient expressed understanding and agreed to proceed.   Reason for visit: work in appt.    HPI: Scheduled work in appt per pt request to discuss labs.  She had questions about recent lab results.  Discussed her cholesterol results and calculated cholesterol risks.  Discussed recommendation for starting a cholesterol medication.  She was in agreement.  Also discussed elevated a1c - 6.0.  Discussed diet and exercise.  She feels good.  Does try to stay active.  No chest pain.  No sob.  No acid reflux.  No abdominal pain.  Bowels moving.  Questions answered regarding all of her lab results.     ROS: See pertinent positives and negatives per HPI.  Past Medical History:  Diagnosis Date  . Hypercholesterolemia   . Seasonal allergies     Past Surgical History:  Procedure Laterality Date  . ABDOMINAL HYSTERECTOMY    . BREAST EXCISIONAL BIOPSY Right 2003   neg  . COLONOSCOPY WITH PROPOFOL N/A 06/25/2018   Procedure: COLONOSCOPY WITH PROPOFOL;  Surgeon: Manya Silvas, MD;  Location: [facility] ENDOSCOPY;   Service: Endoscopy;  Laterality: N/A;  . PARTIAL HYSTERECTOMY  1988   secondary to fibroids and heavy bleeding and endometriosis  . TONSILLECTOMY     age 53    Family History  Problem Relation Age of Onset  . Hypercholesterolemia Mother   . Hypothyroidism Sister   . Hypercholesterolemia Sister   . Breast cancer Maternal Aunt 80  . Colon cancer Neg Hx     SOCIAL HX: reviewed.    Current Outpatient Medications:  .  aspirin EC 81 MG tablet, Take 81 mg by mouth daily., Disp: , Rfl:  .  Calcium Citrate-Vitamin D (CALCIUM CITRATE + D PO), Take by mouth daily., Disp: , Rfl:  .  cetirizine (ZYRTEC) 10 MG tablet, Take 10 mg by mouth daily., Disp: , Rfl:  .  Cholecalciferol (VITAMIN D-3) 1000 UNITS CAPS, Take by mouth 2 (two) times daily., Disp: , Rfl:  .  fluticasone (FLONASE) 50 MCG/ACT nasal spray, Place 2 sprays into both nostrils daily., Disp: 16 g, Rfl: 3 .  Glucosamine-Chondroitin (OSTEO BI-FLEX REGULAR STRENGTH PO), Take 1,500 mg by mouth daily., Disp: , Rfl:  .  Multiple Vitamins-Minerals (CENTRUM SILVER ADULT 50+) TABS, Take by mouth daily., Disp: , Rfl:  .  Omega-3 Fatty Acids (FISH OIL) 1000 MG CAPS, Take by mouth., Disp: , Rfl:  .  OVER THE COUNTER MEDICATION, Tumeric OTC supplement, Disp: , Rfl:  .  Probiotic Product (PROBIOTIC-10 PO), Take by mouth daily., Disp: , Rfl:  .  rosuvastatin (CRESTOR) 10 MG tablet, Take 1 tablet (10 mg  total) by mouth daily., Disp: 90 tablet, Rfl: 1 .  spironolactone (ALDACTONE) 50 MG tablet, Take 50 mg by mouth daily., Disp: , Rfl:   EXAM:  GENERAL: alert.  Sounds to be in no acute distress.  Answering questions appropriately.    PSYCH/NEURO: pleasant and cooperative, no obvious depression or anxiety, speech and thought processing grossly intact  ASSESSMENT AND PLAN:  Discussed the following assessment and plan:  Hypercholesterolemia Discussed recent lab results.  Discussed calculated cholesterol risk.  Questions answered.  She was  agreeable to start crestor.  Discussed diet and exercise.  Follow lipid panel and liver function tests.  Discussed possible side effects of cholesterol medication.    Stress Doing well.  Follow.   Hyperglycemia Low carb diet and exercise.  Follow met b and a1c.     I discussed the assessment and treatment plan with the patient. The patient was provided an opportunity to ask questions and all were answered. The patient agreed with the plan and demonstrated an understanding of the instructions.   The patient was advised to call back or seek an in-person evaluation if the symptoms worsen or if the condition fails to improve as anticipated.  I provided 25 minutes of non-face-to-face time during this encounter.   Einar Pheasant, MD

## 2019-10-04 ENCOUNTER — Encounter: Payer: Self-pay | Admitting: Internal Medicine

## 2019-10-04 DIAGNOSIS — R739 Hyperglycemia, unspecified: Secondary | ICD-10-CM | POA: Insufficient documentation

## 2019-10-04 NOTE — Assessment & Plan Note (Signed)
Doing well.  Follow.  

## 2019-10-04 NOTE — Assessment & Plan Note (Signed)
Discussed recent lab results.  Discussed calculated cholesterol risk.  Questions answered.  She was agreeable to start crestor.  Discussed diet and exercise.  Follow lipid panel and liver function tests.  Discussed possible side effects of cholesterol medication.

## 2019-10-04 NOTE — Assessment & Plan Note (Signed)
Low carb diet and exercise.  Follow met b and a1c.  

## 2019-11-04 ENCOUNTER — Other Ambulatory Visit: Payer: Self-pay

## 2019-11-06 ENCOUNTER — Other Ambulatory Visit (INDEPENDENT_AMBULATORY_CARE_PROVIDER_SITE_OTHER): Payer: PPO

## 2019-11-06 ENCOUNTER — Other Ambulatory Visit: Payer: Self-pay

## 2019-11-06 ENCOUNTER — Other Ambulatory Visit: Payer: Self-pay | Admitting: Internal Medicine

## 2019-11-06 DIAGNOSIS — E7800 Pure hypercholesterolemia, unspecified: Secondary | ICD-10-CM

## 2019-11-06 LAB — HEPATIC FUNCTION PANEL
ALT: 22 U/L (ref 0–35)
AST: 15 U/L (ref 0–37)
Albumin: 4.6 g/dL (ref 3.5–5.2)
Alkaline Phosphatase: 37 U/L — ABNORMAL LOW (ref 39–117)
Bilirubin, Direct: 0.2 mg/dL (ref 0.0–0.3)
Total Bilirubin: 1.2 mg/dL (ref 0.2–1.2)
Total Protein: 6.5 g/dL (ref 6.0–8.3)

## 2019-11-06 NOTE — Progress Notes (Signed)
Order placed for f/u liver panel.  

## 2019-11-07 ENCOUNTER — Encounter: Payer: Self-pay | Admitting: Internal Medicine

## 2019-11-08 ENCOUNTER — Encounter: Payer: Self-pay | Admitting: Internal Medicine

## 2019-11-10 NOTE — Telephone Encounter (Signed)
LMTCB

## 2019-12-24 ENCOUNTER — Ambulatory Visit: Payer: PPO | Attending: Internal Medicine

## 2019-12-24 DIAGNOSIS — Z23 Encounter for immunization: Secondary | ICD-10-CM | POA: Insufficient documentation

## 2019-12-24 NOTE — Progress Notes (Signed)
   Covid-19 Vaccination Clinic  Name:  Tracy Frey    MRN: [id number] DOB: [birth year]  12/24/2019  [name] was observed post Covid-19 immunization for 15 minutes without incidence. She was provided with Vaccine Information Sheet and instruction to access the V-Safe system.   [name] was instructed to call 911 with any severe reactions post vaccine: Marland Kitchen Difficulty breathing  . Swelling of your face and throat  . A fast heartbeat  . A bad rash all over your body  . Dizziness and weakness    Immunizations Administered    Name Date Dose VIS Date Route   Pfizer COVID-19 Vaccine 12/24/2019  2:14 PM 0.3 mL 11/13/2019 Intramuscular   Manufacturer: [place]   Lot: [id number]   [place]: [id number]

## 2020-01-14 ENCOUNTER — Ambulatory Visit: Payer: PPO | Attending: Internal Medicine

## 2020-01-14 DIAGNOSIS — Z23 Encounter for immunization: Secondary | ICD-10-CM | POA: Insufficient documentation

## 2020-01-14 NOTE — Progress Notes (Signed)
   Covid-19 Vaccination Clinic  Name:  Dasmine Sayson    MRN: [id number] DOB: September 14, 1951  01/14/2020  Ms. Jankowiak was observed post Covid-19 immunization for 15 minutes without incidence. She was provided with Vaccine Information Sheet and instruction to access the V-Safe system.   Ms. Eades was instructed to call 911 with any severe reactions post vaccine: Marland Kitchen Difficulty breathing  . Swelling of your face and throat  . A fast heartbeat  . A bad rash all over your body  . Dizziness and weakness    Immunizations Administered    Name Date Dose VIS Date Route   Pfizer COVID-19 Vaccine 01/14/2020  3:08 PM 0.3 mL 11/13/2019 Intramuscular   Manufacturer: [employer]   Lot: [id number]   [place]: [id number]

## 2020-03-10 ENCOUNTER — Other Ambulatory Visit: Payer: Self-pay | Admitting: Internal Medicine

## 2020-04-19 ENCOUNTER — Telehealth: Payer: Self-pay | Admitting: Internal Medicine

## 2020-04-19 NOTE — Telephone Encounter (Signed)
Left message for patient to call back and schedule Medicare Annual Wellness Visit (AWV) either virtually or audio only.  No hx of AWV; please schedule at anytime with Denisa O'Brien-Blaney at [place]   

## 2020-06-02 ENCOUNTER — Encounter: Payer: Self-pay | Admitting: Internal Medicine

## 2020-06-02 ENCOUNTER — Telehealth: Payer: Self-pay | Admitting: Internal Medicine

## 2020-06-02 NOTE — Telephone Encounter (Signed)
Already discussed with pt. See phone note

## 2020-06-02 NOTE — Telephone Encounter (Signed)
Pt is thinking she has sinus infection. She is on vacation and she has isolated herself. Need recommendations on what she should do. She said she has a history of this and just need to know if there is something she can take. I tried offering a virtual visit for tomorrow but pt declined.

## 2020-06-02 NOTE — Telephone Encounter (Signed)
Symptoms started about 4 days ago. Pressure in her head, no fever, drainage- worse in AM, post nasal drip, drainage is clear, starting to cough a little bit. Has mucinex but has not started. doesn't seem like sudafed is working. Has been isolated from her family but wanted to know if she is contagious. She is not having any other symptoms. Taking two benadryl at night. Took one zyrtec this morning. No sudafed today. She has not been around anyone. She is out of town for 5 more days. She is wondering if mucinex and nasacort would help. Has hx of allergies and sinus sx. Pt declined virtual visit.

## 2020-06-02 NOTE — Telephone Encounter (Signed)
Confirmed no chest pain, no sob. Patient advised of below and will call back if questions or concerns.

## 2020-06-02 NOTE — Telephone Encounter (Signed)
Sounds like sinus infection.  Cannot rule out covid.  Agree with mucinex.  Also nasacort nasal spray - 2 sprays each nostril one time per day.  Do this in the evening.  Saline nasal spray - flush nose at least 2-3x/day.  Confirm no sob, chest pain or chest tightness.  To confirm if covid, would need covid testing.  Agree with quarantine.  If persistent symptoms would need to be evaluated.

## 2020-06-10 NOTE — Telephone Encounter (Signed)
Yesterday, 06/09/2020 patient added another note to the  06/02/2020 message.

## 2020-06-13 ENCOUNTER — Other Ambulatory Visit: Payer: Self-pay

## 2020-06-13 ENCOUNTER — Encounter: Payer: Self-pay | Admitting: Nurse Practitioner

## 2020-06-13 ENCOUNTER — Encounter: Payer: PPO | Admitting: Nurse Practitioner

## 2020-06-13 ENCOUNTER — Telehealth: Payer: PPO | Admitting: Nurse Practitioner

## 2020-06-13 DIAGNOSIS — J0100 Acute maxillary sinusitis, unspecified: Secondary | ICD-10-CM

## 2020-06-13 MED ORDER — AMOXICILLIN-POT CLAVULANATE 875-125 MG PO TABS: 1.0000 | ORAL_TABLET | Freq: Two times a day (BID) | ORAL | 0 refills | Status: DC

## 2020-06-13 NOTE — Progress Notes (Signed)

## 2020-07-12 ENCOUNTER — Encounter: Payer: Self-pay | Admitting: Internal Medicine

## 2020-07-12 ENCOUNTER — Telehealth: Payer: Self-pay | Admitting: Internal Medicine

## 2020-07-12 NOTE — Progress Notes (Signed)
Erroneous

## 2020-07-12 NOTE — Telephone Encounter (Signed)
Pt called in wanted a order for a urine test thinks she has a UTI

## 2020-07-12 NOTE — Telephone Encounter (Signed)
Scheduled with [facility] at 8 am on 07/13/20

## 2020-07-12 NOTE — Telephone Encounter (Signed)
Scheduled with KIM to morrow morning UTI symptoms.

## 2020-07-12 NOTE — Telephone Encounter (Signed)
Agree with need for evaluation.  

## 2020-07-13 ENCOUNTER — Ambulatory Visit (INDEPENDENT_AMBULATORY_CARE_PROVIDER_SITE_OTHER): Payer: PPO | Admitting: Nurse Practitioner

## 2020-07-13 ENCOUNTER — Encounter: Payer: Self-pay | Admitting: Nurse Practitioner

## 2020-07-13 ENCOUNTER — Other Ambulatory Visit: Payer: Self-pay

## 2020-07-13 VITALS — BP 120/70 | HR 89 | Temp 98.5°F | Ht 64.0 in | Wt 113.2 lb

## 2020-07-13 DIAGNOSIS — R300 Dysuria: Secondary | ICD-10-CM | POA: Insufficient documentation

## 2020-07-13 LAB — URINALYSIS, MICROSCOPIC ONLY

## 2020-07-13 LAB — POCT URINALYSIS DIPSTICK
Bilirubin, UA: NEGATIVE
Glucose, UA: NEGATIVE
Nitrite, UA: NEGATIVE
Protein, UA: POSITIVE — AB
Spec Grav, UA: 1.02 (ref 1.010–1.025)
Urobilinogen, UA: NEGATIVE E.U./dL — AB
pH, UA: 6 (ref 5.0–8.0)

## 2020-07-13 MED ORDER — CEPHALEXIN 500 MG PO CAPS: 500.0000 mg | ORAL_CAPSULE | Freq: Two times a day (BID) | ORAL | 0 refills | Status: AC

## 2020-07-13 NOTE — Patient Instructions (Addendum)
You have symptoms concerning for urinary tract infection.  The urine culture is pending.    Treat with Keflex which is an antibiotic you tolerated well in 2019.  Please stay on your probiotics.   Drink plenty of fluids.  I would just stop the wipes that you are using.  Be sure to wipe with toilet tissue from front to back.  Monitor for continued symptoms, or new symptoms such as fever, chills, nausea, vomiting, or upper back pain.  Seek care if you develop those problems.   Dysuria Dysuria is pain or discomfort while urinating. The pain or discomfort may be felt in the part of your body that drains urine from the bladder (urethra) or in the surrounding tissue of the genitals. The pain may also be felt in the groin area, lower abdomen, or lower back. You may have to urinate frequently or have the sudden feeling that you have to urinate (urgency). Dysuria can affect both men and women, but it is more common in women. Dysuria can be caused by many different things, including:  Urinary tract infection.  Kidney stones or bladder stones.  Certain sexually transmitted infections (ST Is), such as chlamydia.  Dehydration.  Inflammation of the tissues of the vagina.  Use of certain medicines.  Use of certain soaps or scented products that cause irritation. Follow these instructions at home: General instructions  Watch your condition for any changes.  Urinate often. Avoid holding urine for long periods of time.  After a bowel movement or urination, women should cleanse from front to back, using each tissue only once.  Urinate after sexual intercourse.  Keep all follow-up visits as told by your health care provider. This is important.  If you had any tests done to find the cause of dysuria, it is up to you to get your test results. Ask your health care provider, or the department that is doing the test, when your results will be ready. Eating and drinking   Drink enough fluid to keep  your urine pale yellow.  Avoid caffeine, tea, and alcohol. They can irritate the bladder and make dysuria worse. In men, alcohol may irritate the prostate. Medicines  Take over-the-counter and prescription medicines only as told by your health care provider.  If you were prescribed an antibiotic medicine, take it as told by your health care provider. Do not stop taking the antibiotic even if you start to feel better. Contact a health care provider if:  You have a fever.  You develop pain in your back or sides.  You have nausea or vomiting.  You have blood in your urine.  You are not urinating as often as you usually do. Get help right away if:  Your pain is severe and not relieved with medicines.  You cannot eat or drink without vomiting.  You are confused.  You have a rapid heartbeat while at rest.  You have shaking or chills.  You feel extremely weak. Summary  Dysuria is pain or discomfort while urinating. Many different conditions can lead to dysuria.  If you have dysuria, you may have to urinate frequently or have the sudden feeling that you have to urinate (urgency).  Watch your condition for any changes. Keep all follow-up visits as told by your health care provider.  Make sure that you urinate often and drink enough fluid to keep your urine pale yellow. This information is not intended to replace advice given to you by your health care provider. Make sure you  discuss any questions you have with your health care provider. Document Revised: 11/01/2017 Document Reviewed: 09/05/2017 Elsevier Patient Education  [place].

## 2020-07-13 NOTE — Assessment & Plan Note (Signed)
Symptoms concerning for urinary tract infection.  The urine culture is pending.   Treat with Keflex which is an antibiotic you tolerated well in 2019.  Please stay on your probiotics.  Given return precautions.

## 2020-07-13 NOTE — Progress Notes (Signed)
Established Patient Office Visit  Subjective:  Patient ID: Tracy Frey, female    DOB: [birth year]  Age: 69 y.o. MRN: [id number]  CC:  Chief Complaint  Patient presents with  . Acute Visit    UTI    HPI Tracy Frey is a 69 yo who presents for onset Monday of mild pain and frequency with urination. She also has pelvic heaviness. It is now stinging when she voids.  She has been hydrating very well.  She took a cranberry tablets.  This reminds her of her UTI in 2019. No fever/chills/flank pain/nausea or vomiting.  She is sexually active.  History of hysterectomy many years ago and has noted no vaginal discharge.  She feels well otherwise.  She normally takes a daily probiotic.  Bowel habits are normal formed stool.  No problems with diarrhea.  She was E- prescribed Augmentin 06/13/20 for sinus infection- it made her feel horrible with diarrhea, lethargic and she took it for 3 days only. No rash or angioedema. No further antibiotics and her sinus symptoms improved.   Hx of UTI treated with Keflex in  2019 and tolerated well.   Past Medical History:  Diagnosis Date  . Hypercholesterolemia   . Seasonal allergies     Past Surgical History:  Procedure Laterality Date  . ABDOMINAL HYSTERECTOMY    . BREAST EXCISIONAL BIOPSY Right 2003   neg  . COLONOSCOPY WITH PROPOFOL N/A 06/25/2018   Procedure: COLONOSCOPY WITH PROPOFOL;  Surgeon: Manya Silvas, MD;  Location: [facility] ENDOSCOPY;  Service: Endoscopy;  Laterality: N/A;  . PARTIAL HYSTERECTOMY  1988   secondary to fibroids and heavy bleeding and endometriosis  . TONSILLECTOMY     age 43    Family History  Problem Relation Age of Onset  . Hypercholesterolemia Mother   . Hypothyroidism Sister   . Hypercholesterolemia Sister   . Breast cancer Maternal Aunt 80  . Colon cancer Neg Hx     Social History   Socioeconomic History  . Marital status: Married    Spouse name: Not on file  . Number of children: 2  . Years of  education: Not on file  . Highest education level: Not on file  Occupational History    Employer: [employer]  Tobacco Use  . Smoking status: Never Smoker  . Smokeless tobacco: Never Used  Substance and Sexual Activity  . Alcohol use: Yes    Alcohol/week: 0.0 standard drinks    Comment: ocassionally  . Drug use: No  . Sexual activity: Not on file  Other Topics Concern  . Not on file  Social History Narrative  . Not on file   Social Determinants of Health   Financial Resource Strain:   . Difficulty of Paying Living Expenses:   Food Insecurity:   . Worried About Charity fundraiser in the Last Year:   . Arboriculturist in the Last Year:   Transportation Needs:   . Film/video editor (Medical):   Marland Kitchen Lack of Transportation (Non-Medical):   Physical Activity:   . Days of Exercise per Week:   . Minutes of Exercise per Session:   Stress:   . Feeling of Stress :   Social Connections:   . Frequency of Communication with Friends and Family:   . Frequency of Social Gatherings with Friends and Family:   . Attends Religious Services:   . Active Member of Clubs or Organizations:   . Attends Archivist Meetings:   .  Marital Status:   Intimate Partner Violence:   . Fear of Current or Ex-Partner:   . Emotionally Abused:   Marland Kitchen Physically Abused:   . Sexually Abused:     Outpatient Medications Prior to Visit  Medication Sig Dispense Refill  . aspirin EC 81 MG tablet Take 81 mg by mouth daily.    . Calcium Citrate-Vitamin D (CALCIUM CITRATE + D PO) Take by mouth daily.    . cetirizine (ZYRTEC) 10 MG tablet Take 10 mg by mouth daily.    . Cholecalciferol (VITAMIN D-3) 1000 UNITS CAPS Take by mouth 2 (two) times daily.    . fluticasone (FLONASE) 50 MCG/ACT nasal spray Place 2 sprays into both nostrils daily. 16 g 3  . Glucosamine-Chondroitin (OSTEO BI-FLEX REGULAR STRENGTH PO) Take 1,500 mg by mouth daily.    . Multiple Vitamins-Minerals (CENTRUM SILVER ADULT 50+) TABS Take by  mouth daily.    . Omega-3 Fatty Acids (FISH OIL) 1000 MG CAPS Take by mouth.    Marland Kitchen OVER THE COUNTER MEDICATION Tumeric OTC supplement    . Probiotic Product (PROBIOTIC-10 PO) Take by mouth daily.    . rosuvastatin (CRESTOR) 10 MG tablet TAKE 1 TABLET BY MOUTH EVERY DAY 90 tablet 1  . spironolactone (ALDACTONE) 50 MG tablet Take 50 mg by mouth daily.    Marland Kitchen amoxicillin-clavulanate (AUGMENTIN) 875-125 MG tablet Take 1 tablet by mouth 2 (two) times daily. 14 tablet 0   No facility-administered medications prior to visit.    Allergies  Allergen Reactions  . Chocolate   . Sulfa Antibiotics Hives   Review of Systems Pertinent positives noted in history of present illness otherwise negative.   Objective:    Physical Exam Vitals reviewed.  Constitutional:      Appearance: Normal appearance. She is normal weight.  HENT:     Head: Normocephalic.  Cardiovascular:     Rate and Rhythm: Normal rate and regular rhythm.     Pulses: Normal pulses.     Heart sounds: Normal heart sounds.  Pulmonary:     Effort: Pulmonary effort is normal.     Breath sounds: Normal breath sounds.  Abdominal:     General: Abdomen is flat.     Palpations: Abdomen is soft.     Tenderness: There is no abdominal tenderness. There is no right CVA tenderness or left CVA tenderness.  Genitourinary:    Comments: Mild supra pubic tenderness over the bladder -non distended  Musculoskeletal:        General: Normal range of motion.  Skin:    General: Skin is warm and dry.  Neurological:     Mental Status: She is alert and oriented to person, place, and time.  Psychiatric:        Mood and Affect: Mood normal.     BP 120/70 (BP Location: Left Arm, Patient Position: Sitting, Cuff Size: Normal)   Pulse 89   Temp 98.5 F (36.9 C) (Oral)   Ht 5\' 4"  (1.626 m)   Wt 113 lb 3.2 oz (51.3 kg)   SpO2 99%   BMI 19.43 kg/m  Wt Readings from Last 3 Encounters:  07/13/20 113 lb 3.2 oz (51.3 kg)  08/04/19 114 lb 9.6 oz (52  kg)  06/25/18 117 lb (53.1 kg)     Health Maintenance Due  Topic Date Due  . Hepatitis C Screening  Never done  . DEXA SCAN  Never done  . PNA vac Low Risk Adult (1 of 2 - PCV13) Never done  There are no preventive care reminders to display for this patient.  Lab Results  Component Value Date   TSH 2.34 09/03/2019   Lab Results  Component Value Date   WBC 4.3 09/03/2019   HGB 14.9 09/03/2019   HCT 44.1 09/03/2019   MCV 94.9 09/03/2019   PLT 229.0 09/03/2019   Lab Results  Component Value Date   NA 142 09/03/2019   K 3.6 09/03/2019   CO2 30 09/03/2019   GLUCOSE 95 09/03/2019   BUN 15 09/03/2019   CREATININE 0.77 09/03/2019   BILITOT 1.2 11/06/2019   ALKPHOS 37 (L) 11/06/2019   AST 15 11/06/2019   ALT 22 11/06/2019   PROT 6.5 11/06/2019   ALBUMIN 4.6 11/06/2019   CALCIUM 10.4 09/03/2019   GFR 74.79 09/03/2019     Assessment & Plan well, is   Problem List Items Addressed This Visit      Other   Dysuria - Primary   Relevant Orders   Urine Culture   Urine Microscopic Only   POCT Urinalysis Dipstick (Completed)     Symptoms concerning for urinary tract infection.  The urine culture is pending.    Treat with Keflex which is an antibiotic you tolerated well in 2019.  Please stay on your probiotics.  Drink plenty of fluids.  Stop the wipes that you are using.  Be sure to wipe with toilet tissue from front to back.  Monitor for continued symptoms, or new symptoms such as fever, chills, nausea, vomiting, or upper back pain.  Seek care if you develop those problems.  Meds ordered this encounter  Medications  . cephALEXin (KEFLEX) 500 MG capsule    Sig: Take 1 capsule (500 mg total) by mouth 2 (two) times daily for 7 days.    Dispense:  14 capsule    Refill:  0    Order Specific Question:   Supervising Provider    Answer:   Einar Pheasant [[id number]]    Follow-up: Return if symptoms worsen or fail to improve.   This visit occurred during the SARS-CoV-2  public health emergency.  Safety protocols were in place, including screening questions prior to the visit, additional usage of staff PPE, and extensive cleaning of exam room while observing appropriate contact time as indicated for disinfecting solutions.    Denice Paradise, NP

## 2020-07-16 ENCOUNTER — Encounter: Payer: Self-pay | Admitting: Nurse Practitioner

## 2020-07-16 LAB — URINE CULTURE
MICRO NUMBER:: 10813831
SPECIMEN QUALITY:: ADEQUATE

## 2020-07-18 ENCOUNTER — Other Ambulatory Visit: Payer: Self-pay | Admitting: Internal Medicine

## 2020-07-18 ENCOUNTER — Encounter: Payer: Self-pay | Admitting: Internal Medicine

## 2020-07-18 DIAGNOSIS — E7800 Pure hypercholesterolemia, unspecified: Secondary | ICD-10-CM

## 2020-07-18 DIAGNOSIS — R739 Hyperglycemia, unspecified: Secondary | ICD-10-CM

## 2020-07-20 NOTE — Telephone Encounter (Signed)
Please schedule Tracy Frey for a fasting lab appt between 07/25/20 and 08/01/20.  Thanks

## 2020-07-20 NOTE — Telephone Encounter (Signed)
Pt scheduled  

## 2020-07-28 ENCOUNTER — Other Ambulatory Visit: Payer: Self-pay

## 2020-07-28 ENCOUNTER — Other Ambulatory Visit (INDEPENDENT_AMBULATORY_CARE_PROVIDER_SITE_OTHER): Payer: PPO

## 2020-07-28 DIAGNOSIS — E7800 Pure hypercholesterolemia, unspecified: Secondary | ICD-10-CM | POA: Diagnosis not present

## 2020-07-28 DIAGNOSIS — R739 Hyperglycemia, unspecified: Secondary | ICD-10-CM | POA: Diagnosis not present

## 2020-07-28 LAB — COMPREHENSIVE METABOLIC PANEL
ALT: 23 U/L (ref 0–35)
AST: 14 U/L (ref 0–37)
Albumin: 4.5 g/dL (ref 3.5–5.2)
Alkaline Phosphatase: 32 U/L — ABNORMAL LOW (ref 39–117)
BUN: 19 mg/dL (ref 6–23)
CO2: 30 mEq/L (ref 19–32)
Calcium: 10.3 mg/dL (ref 8.4–10.5)
Chloride: 102 mEq/L (ref 96–112)
Creatinine, Ser: 0.79 mg/dL (ref 0.40–1.20)
GFR: 72.41 mL/min (ref >60.00)
Glucose, Bld: 109 mg/dL — ABNORMAL HIGH (ref 70–99)
Potassium: 4 mEq/L (ref 3.5–5.1)
Sodium: 140 mEq/L (ref 135–145)
Total Bilirubin: 0.8 mg/dL (ref 0.2–1.2)
Total Protein: 6.6 g/dL (ref 6.0–8.3)

## 2020-07-28 LAB — CBC WITH DIFFERENTIAL/PLATELET
Basophils Absolute: 0.1 10*3/uL (ref 0.0–0.1)
Basophils Relative: 1.4 % (ref 0.0–3.0)
Eosinophils Absolute: 0.1 10*3/uL (ref 0.0–0.7)
Eosinophils Relative: 3 % (ref 0.0–5.0)
HCT: 43.7 % (ref 36.0–46.0)
Hemoglobin: 14.8 g/dL (ref 12.0–15.0)
Lymphocytes Relative: 24.9 % (ref 12.0–46.0)
Lymphs Abs: 1.1 10*3/uL (ref 0.7–4.0)
MCHC: 33.8 g/dL (ref 30.0–36.0)
MCV: 94.6 fl (ref 78.0–100.0)
Monocytes Absolute: 0.6 10*3/uL (ref 0.1–1.0)
Monocytes Relative: 13.8 % — ABNORMAL HIGH (ref 3.0–12.0)
Neutro Abs: 2.5 10*3/uL (ref 1.4–7.7)
Neutrophils Relative %: 56.9 % (ref 43.0–77.0)
Platelets: 231 10*3/uL (ref 150.0–400.0)
RBC: 4.61 Mil/uL (ref 3.87–5.11)
RDW: 12.5 % (ref 11.5–15.5)
WBC: 4.4 10*3/uL (ref 4.0–10.5)

## 2020-07-28 LAB — LIPID PANEL
Cholesterol: 175 mg/dL (ref 0–200)
HDL: 61.2 mg/dL (ref >39.00)
LDL Cholesterol: 102 mg/dL — ABNORMAL HIGH (ref 0–99)
NonHDL: 113.3
Total CHOL/HDL Ratio: 3
Triglycerides: 58 mg/dL (ref 0.0–149.0)
VLDL: 11.6 mg/dL (ref 0.0–40.0)

## 2020-07-28 LAB — TSH: TSH: 2.58 u[IU]/mL (ref 0.35–4.50)

## 2020-07-28 LAB — HEMOGLOBIN A1C: Hgb A1c MFr Bld: 6 % (ref 4.6–6.5)

## 2020-08-04 ENCOUNTER — Other Ambulatory Visit: Payer: Self-pay

## 2020-08-04 ENCOUNTER — Ambulatory Visit (INDEPENDENT_AMBULATORY_CARE_PROVIDER_SITE_OTHER): Payer: PPO | Admitting: Internal Medicine

## 2020-08-04 VITALS — BP 140/86 | HR 86 | Temp 98.7°F | Resp 16 | Ht 64.0 in | Wt 114.2 lb

## 2020-08-04 DIAGNOSIS — F439 Reaction to severe stress, unspecified: Secondary | ICD-10-CM

## 2020-08-04 DIAGNOSIS — Z0000 Encounter for general adult medical examination without abnormal findings: Secondary | ICD-10-CM

## 2020-08-04 DIAGNOSIS — Z1231 Encounter for screening mammogram for malignant neoplasm of breast: Secondary | ICD-10-CM | POA: Diagnosis not present

## 2020-08-04 DIAGNOSIS — R319 Hematuria, unspecified: Secondary | ICD-10-CM | POA: Diagnosis not present

## 2020-08-04 DIAGNOSIS — E2839 Other primary ovarian failure: Secondary | ICD-10-CM

## 2020-08-04 DIAGNOSIS — R030 Elevated blood-pressure reading, without diagnosis of hypertension: Secondary | ICD-10-CM | POA: Diagnosis not present

## 2020-08-04 DIAGNOSIS — R739 Hyperglycemia, unspecified: Secondary | ICD-10-CM

## 2020-08-04 DIAGNOSIS — E7800 Pure hypercholesterolemia, unspecified: Secondary | ICD-10-CM

## 2020-08-04 DIAGNOSIS — N3001 Acute cystitis with hematuria: Secondary | ICD-10-CM | POA: Diagnosis not present

## 2020-08-04 NOTE — Progress Notes (Signed)
Patient ID: Shadawn Hanaway, female   DOB: February 22, 1951, 69 y.o.   MRN: [id number]   Subjective:    Patient ID: [name], female    DOB: 1951/01/20, 69 y.o.   MRN: [id number]  HPI This visit occurred during the SARS-CoV-2 public health emergency.  Safety protocols were in place, including screening questions prior to the visit, additional usage of staff PPE, and extensive cleaning of exam room while observing appropriate contact time as indicated for disinfecting solutions.  Patient here for her physical exam.  She reports she is doing relatively well.  Some increased stress, but overall appears to be handling things well.  Recently evaluated by Dawson Bills for dysuria and frequency.  Treated with keflex. Symptoms resolved.  Tries to stay active.  No chest pain or sob reported.  No abdominal pain or bowel change reported.  Discussed labs.  Tolerating crestor. Due mammogram and bone density.     Past Medical History:  Diagnosis Date  . Hypercholesterolemia   . Seasonal allergies    Past Surgical History:  Procedure Laterality Date  . ABDOMINAL HYSTERECTOMY    . BREAST EXCISIONAL BIOPSY Right 2003   neg  . COLONOSCOPY WITH PROPOFOL N/A 06/25/2018   Procedure: COLONOSCOPY WITH PROPOFOL;  Surgeon: Manya Silvas, MD;  Location: [facility] ENDOSCOPY;  Service: Endoscopy;  Laterality: N/A;  . PARTIAL HYSTERECTOMY  1988   secondary to fibroids and heavy bleeding and endometriosis  . TONSILLECTOMY     age 87   Family History  Problem Relation Age of Onset  . Hypercholesterolemia Mother   . Hypothyroidism Sister   . Hypercholesterolemia Sister   . Breast cancer Maternal Aunt 80  . Colon cancer Neg Hx    Social History   Socioeconomic History  . Marital status: Married    Spouse name: Not on file  . Number of children: 2  . Years of education: Not on file  . Highest education level: Not on file  Occupational History    Employer: [employer]  Tobacco Use  . Smoking status: Never Smoker  .  Smokeless tobacco: Never Used  Substance and Sexual Activity  . Alcohol use: Yes    Alcohol/week: 0.0 standard drinks    Comment: ocassionally  . Drug use: No  . Sexual activity: Not on file  Other Topics Concern  . Not on file  Social History Narrative  . Not on file   Social Determinants of Health   Financial Resource Strain:   . Difficulty of Paying Living Expenses: Not on file  Food Insecurity:   . Worried About Charity fundraiser in the Last Year: Not on file  . Ran Out of Food in the Last Year: Not on file  Transportation Needs:   . Lack of Transportation (Medical): Not on file  . Lack of Transportation (Non-Medical): Not on file  Physical Activity:   . Days of Exercise per Week: Not on file  . Minutes of Exercise per Session: Not on file  Stress:   . Feeling of Stress : Not on file  Social Connections:   . Frequency of Communication with Friends and Family: Not on file  . Frequency of Social Gatherings with Friends and Family: Not on file  . Attends Religious Services: Not on file  . Active Member of Clubs or Organizations: Not on file  . Attends Archivist Meetings: Not on file  . Marital Status: Not on file    Outpatient Encounter Medications as of 08/04/2020  Medication Sig  . aspirin EC 81 MG tablet Take 81 mg by mouth daily.  . Calcium Citrate-Vitamin D (CALCIUM CITRATE + D PO) Take by mouth daily.  . cetirizine (ZYRTEC) 10 MG tablet Take 10 mg by mouth daily.  . Cholecalciferol (VITAMIN D-3) 1000 UNITS CAPS Take by mouth 2 (two) times daily.  . fluticasone (FLONASE) 50 MCG/ACT nasal spray Place 2 sprays into both nostrils daily.  . Glucosamine-Chondroitin (OSTEO BI-FLEX REGULAR STRENGTH PO) Take 1,500 mg by mouth daily.  . Multiple Vitamins-Minerals (CENTRUM SILVER ADULT 50+) TABS Take by mouth daily.  . Omega-3 Fatty Acids (FISH OIL) 1000 MG CAPS Take by mouth.  Marland Kitchen OVER THE COUNTER MEDICATION Tumeric OTC supplement  . Probiotic Product  (PROBIOTIC-10 PO) Take by mouth daily.  . rosuvastatin (CRESTOR) 10 MG tablet TAKE 1 TABLET BY MOUTH EVERY DAY  . spironolactone (ALDACTONE) 50 MG tablet Take 50 mg by mouth daily.   No facility-administered encounter medications on file as of 08/04/2020.    Review of Systems  Constitutional: Negative for appetite change and unexpected weight change.  HENT: Negative for congestion and sinus pressure.   Eyes: Negative for pain and visual disturbance.  Respiratory: Negative for cough, chest tightness and shortness of breath.   Cardiovascular: Negative for chest pain, palpitations and leg swelling.  Gastrointestinal: Negative for abdominal pain, diarrhea, nausea and vomiting.  Genitourinary: Negative for difficulty urinating and dysuria.  Musculoskeletal: Negative for joint swelling and myalgias.  Skin: Negative for color change and rash.  Neurological: Negative for dizziness, light-headedness and headaches.  Hematological: Negative for adenopathy. Does not bruise/bleed easily.  Psychiatric/Behavioral: Negative for agitation and dysphoric mood.       Objective:    Physical Exam Vitals reviewed.  Constitutional:      General: She is not in acute distress.    Appearance: Normal appearance. She is well-developed.  HENT:     Head: Normocephalic and atraumatic.     Right Ear: External ear normal.     Left Ear: External ear normal.  Eyes:     General: No scleral icterus.       Right eye: No discharge.        Left eye: No discharge.     Conjunctiva/sclera: Conjunctivae normal.  Neck:     Thyroid: No thyromegaly.  Cardiovascular:     Rate and Rhythm: Normal rate and regular rhythm.  Pulmonary:     Effort: No tachypnea, accessory muscle usage or respiratory distress.     Breath sounds: Normal breath sounds. No decreased breath sounds or wheezing.  Chest:     Breasts:        Right: No inverted nipple, mass, nipple discharge or tenderness (no axillary adenopathy).        Left: No  inverted nipple, mass, nipple discharge or tenderness (no axilarry adenopathy).  Abdominal:     General: Bowel sounds are normal.     Palpations: Abdomen is soft.     Tenderness: There is no abdominal tenderness.  Musculoskeletal:        General: No swelling or tenderness.     Cervical back: Neck supple. No tenderness.  Lymphadenopathy:     Cervical: No cervical adenopathy.  Skin:    Findings: No erythema or rash.  Neurological:     Mental Status: She is alert and oriented to person, place, and time.  Psychiatric:        Mood and Affect: Mood normal.        Behavior: Behavior  normal.     BP 140/86   Pulse 86   Temp 98.7 F (37.1 C) (Oral)   Resp 16   Ht $R'5\' 4"'hb$  (1.626 m)   Wt 114 lb 3.2 oz (51.8 kg)   SpO2 99%   BMI 19.60 kg/m  Wt Readings from Last 3 Encounters:  08/04/20 114 lb 3.2 oz (51.8 kg)  07/13/20 113 lb 3.2 oz (51.3 kg)  08/04/19 114 lb 9.6 oz (52 kg)     Lab Results  Component Value Date   WBC 4.4 07/28/2020   HGB 14.8 07/28/2020   HCT 43.7 07/28/2020   PLT 231.0 07/28/2020   GLUCOSE 109 (H) 07/28/2020   CHOL 175 07/28/2020   TRIG 58.0 07/28/2020   HDL 61.20 07/28/2020   LDLDIRECT 168.1 04/17/2013   LDLCALC 102 (H) 07/28/2020   ALT 23 07/28/2020   AST 14 07/28/2020   NA 140 07/28/2020   K 4.0 07/28/2020   CL 102 07/28/2020   CREATININE 0.79 07/28/2020   BUN 19 07/28/2020   CO2 30 07/28/2020   TSH 2.58 07/28/2020   HGBA1C 6.0 07/28/2020    MM DIAG BREAST TOMO BILATERAL  Result Date: 05/14/2019 CLINICAL DATA:  The patient returns after screening study for evaluation of possible bilateral breast asymmetries. EXAM: DIGITAL DIAGNOSTIC BILATERAL MAMMOGRAM WITH CAD AND TOMO COMPARISON:  05/08/2019 and earlier ACR Breast Density Category c: The breast tissue is heterogeneously dense, which may obscure small masses. FINDINGS: Additional 2-D and 3-D images are performed. These views show no persistent abnormality in the LATERAL portion of the RIGHT  breast. No suspicious mass, distortion, or microcalcifications are identified to suggest presence of malignancy. Additional views of the LATERAL portion of the LEFT breast show no persistent abnormality. Mammographic images were processed with CAD. IMPRESSION: No mammographic evidence for malignancy. RECOMMENDATION: Screening mammogram in one year.(Code:[id number]-B-01Y) I have discussed the findings and recommendations with the patient. Results were also provided in writing at the conclusion of the visit. If applicable, a reminder letter will be sent to the patient regarding the next appointment. BI-RADS CATEGORY  1: Negative. Electronically Signed   By: Nolon Nations M.D.   On: 05/14/2019 11:57       Assessment & Plan:   Problem List Items Addressed This Visit    UTI (urinary tract infection)    Previous UTI.  Treated.  Symptoms resolved.  Will need f/u urine to confirm red blood cells clear.        Stress    Increased stress as outlined.  Discussed. Overall appears to be handling things well.  Follow.        Hyperglycemia    Follow met b and a1c.       Hypercholesterolemia    Tolerating crestor.  Cholesterol improved.  Follow lipid panel and liver function tests.        Health care maintenance    Physical today 08/04/20.  Colonoscopy 06/2018.  Recommended f/u colonoscopy in 10 years.  Mammogram ordered.  Schedule mammogram and bone density.        Elevated blood pressure reading    Recheck improved.  Follow pressures.         Other Visit Diagnoses    Visit for screening mammogram       Relevant Orders   MM 3D SCREEN BREAST BILATERAL   Estrogen deficiency       Hematuria, unspecified type       Relevant Orders   Urinalysis, Routine w reflex microscopic  Rozella Servello, MD 

## 2020-08-14 ENCOUNTER — Encounter: Payer: Self-pay | Admitting: Internal Medicine

## 2020-08-14 ENCOUNTER — Telehealth: Payer: Self-pay | Admitting: Internal Medicine

## 2020-08-14 NOTE — Assessment & Plan Note (Signed)
Increased stress as outlined.  Discussed. Overall appears to be handling things well.  Follow.

## 2020-08-14 NOTE — Assessment & Plan Note (Signed)
Tolerating crestor.  Cholesterol improved.  Follow lipid panel and liver function tests.

## 2020-08-14 NOTE — Assessment & Plan Note (Signed)
Follow met b and a1c.  

## 2020-08-14 NOTE — Telephone Encounter (Signed)
Tracy Frey was recently evaluated and treated for UTI.  Her urine revealed some red blood cells - which is common when infection present.  we do need to check a f/u urine to confirm clear.  Notify her that I would like for her to come and leave a urine sample, so we can check.  Please schedule appt. For sometime this week.  Order is in.

## 2020-08-14 NOTE — Assessment & Plan Note (Signed)
Recheck improved.  Follow pressures.

## 2020-08-14 NOTE — Assessment & Plan Note (Signed)
Previous UTI.  Treated.  Symptoms resolved.  Will need f/u urine to confirm red blood cells clear.

## 2020-08-14 NOTE — Assessment & Plan Note (Signed)
Physical today 08/04/20.  Colonoscopy 06/2018.  Recommended f/u colonoscopy in 10 years.  Mammogram ordered.  Schedule mammogram and bone density.

## 2020-08-15 NOTE — Telephone Encounter (Signed)
Left message to call back and schedule a lab visit for a urine drop off.

## 2020-08-18 DIAGNOSIS — L700 Acne vulgaris: Secondary | ICD-10-CM | POA: Diagnosis not present

## 2020-08-18 DIAGNOSIS — D2272 Melanocytic nevi of left lower limb, including hip: Secondary | ICD-10-CM | POA: Diagnosis not present

## 2020-08-18 DIAGNOSIS — D2262 Melanocytic nevi of left upper limb, including shoulder: Secondary | ICD-10-CM | POA: Diagnosis not present

## 2020-08-18 DIAGNOSIS — D2271 Melanocytic nevi of right lower limb, including hip: Secondary | ICD-10-CM | POA: Diagnosis not present

## 2020-08-18 DIAGNOSIS — D2261 Melanocytic nevi of right upper limb, including shoulder: Secondary | ICD-10-CM | POA: Diagnosis not present

## 2020-08-18 DIAGNOSIS — D225 Melanocytic nevi of trunk: Secondary | ICD-10-CM | POA: Diagnosis not present

## 2020-08-18 DIAGNOSIS — L821 Other seborrheic keratosis: Secondary | ICD-10-CM | POA: Diagnosis not present

## 2020-08-24 NOTE — Telephone Encounter (Signed)
Left detailed message for patient.

## 2020-09-06 ENCOUNTER — Encounter: Payer: Self-pay | Admitting: Internal Medicine

## 2020-09-06 ENCOUNTER — Other Ambulatory Visit: Payer: Self-pay | Admitting: Internal Medicine

## 2020-09-08 ENCOUNTER — Other Ambulatory Visit: Payer: Self-pay

## 2020-09-08 DIAGNOSIS — Z13820 Encounter for screening for osteoporosis: Secondary | ICD-10-CM

## 2020-09-08 NOTE — Telephone Encounter (Signed)
Pt Called Tracy Frey and the orders are not placed

## 2020-10-14 ENCOUNTER — Telehealth: Payer: Self-pay | Admitting: Internal Medicine

## 2020-10-14 MED ORDER — ESTRADIOL 0.1 MG/GM VA CREA: TOPICAL_CREAM | VAGINAL | 2 refills | Status: DC

## 2020-10-14 NOTE — Telephone Encounter (Signed)
Advised that she would call back to schedule UA

## 2020-10-14 NOTE — Telephone Encounter (Signed)
Patient stated that she discussed having an estrogen cream called in during her last appt? I did not see anything in her last OV note.

## 2020-10-14 NOTE — Telephone Encounter (Signed)
Patient is requesting a prescription for the estrogen cream the talked about at patient's appointment on 08/04/2020. She also scheduled her 29m follow up and labs before appt. Per check outs on 08/04/20. Please put lab order labs for 02/14/20.

## 2020-10-14 NOTE — Telephone Encounter (Signed)
rx sent in for estrace cream.  Also, please notify her that I would like a f/u urinalysis to confirm no red blood cells present.  Can do at her convenience.

## 2020-10-14 NOTE — Telephone Encounter (Signed)
Patient aware.

## 2020-10-18 ENCOUNTER — Other Ambulatory Visit: Payer: PPO

## 2020-12-08 ENCOUNTER — Other Ambulatory Visit: Payer: Self-pay | Admitting: Internal Medicine

## 2020-12-08 ENCOUNTER — Ambulatory Visit
Admission: RE | Admit: 2020-12-08 | Discharge: 2020-12-08 | Disposition: A | Discharge status: Home / Self Care | Payer: PPO | Source: Ambulatory Visit | Attending: Internal Medicine | Admitting: Internal Medicine

## 2020-12-08 ENCOUNTER — Other Ambulatory Visit: Payer: Self-pay

## 2020-12-08 DIAGNOSIS — Z1231 Encounter for screening mammogram for malignant neoplasm of breast: Secondary | ICD-10-CM | POA: Diagnosis not present

## 2020-12-08 DIAGNOSIS — M810 Age-related osteoporosis without current pathological fracture: Secondary | ICD-10-CM | POA: Insufficient documentation

## 2020-12-08 DIAGNOSIS — Z8739 Personal history of other diseases of the musculoskeletal system and connective tissue: Secondary | ICD-10-CM | POA: Diagnosis not present

## 2020-12-08 DIAGNOSIS — Z90710 Acquired absence of both cervix and uterus: Secondary | ICD-10-CM | POA: Diagnosis not present

## 2020-12-08 DIAGNOSIS — N6489 Other specified disorders of breast: Secondary | ICD-10-CM | POA: Diagnosis not present

## 2020-12-08 DIAGNOSIS — Z780 Asymptomatic menopausal state: Secondary | ICD-10-CM | POA: Insufficient documentation

## 2020-12-08 DIAGNOSIS — N6310 Unspecified lump in the right breast, unspecified quadrant: Secondary | ICD-10-CM

## 2020-12-08 DIAGNOSIS — Z13820 Encounter for screening for osteoporosis: Secondary | ICD-10-CM

## 2020-12-08 DIAGNOSIS — R928 Other abnormal and inconclusive findings on diagnostic imaging of breast: Secondary | ICD-10-CM

## 2020-12-09 ENCOUNTER — Telehealth: Payer: Self-pay | Admitting: Internal Medicine

## 2020-12-09 NOTE — Telephone Encounter (Signed)
Patient voiced understanding to results. 

## 2020-12-09 NOTE — Telephone Encounter (Signed)
Pt returning a call about results  

## 2020-12-12 ENCOUNTER — Other Ambulatory Visit: Payer: Self-pay

## 2020-12-12 ENCOUNTER — Ambulatory Visit: Payer: PPO

## 2020-12-12 ENCOUNTER — Ambulatory Visit (INDEPENDENT_AMBULATORY_CARE_PROVIDER_SITE_OTHER): Payer: PPO | Admitting: Internal Medicine

## 2020-12-12 DIAGNOSIS — F439 Reaction to severe stress, unspecified: Secondary | ICD-10-CM

## 2020-12-12 DIAGNOSIS — R928 Other abnormal and inconclusive findings on diagnostic imaging of breast: Secondary | ICD-10-CM

## 2020-12-12 DIAGNOSIS — M810 Age-related osteoporosis without current pathological fracture: Secondary | ICD-10-CM

## 2020-12-12 MED ORDER — ALENDRONATE SODIUM 70 MG PO TABS: 70.0000 mg | ORAL_TABLET | ORAL | 11 refills | Status: DC

## 2020-12-12 NOTE — Progress Notes (Signed)
Patient ID: Tracy Frey, female   DOB: 03-Jan-1951, 70 y.o.   MRN: [id number]   Subjective:    Patient ID: Tracy Frey, female    DOB: [birth year], 70 y.o.   MRN: [id number]  HPI This visit occurred during the SARS-CoV-2 public health emergency.  Safety protocols were in place, including screening questions prior to the visit, additional usage of staff PPE, and extensive cleaning of exam room while observing appropriate contact time as indicated for disinfecting solutions.  Patient here for a scheduled follow up.  Here to discuss her recent bone density.  Discussed bone density results.  Discussed treatment options. She elected to take fosamax.  Discussed possible side effects of medication.  Also discuss taking calcium, vitamin D and weight bearing exercise.  She had questions about her mammogram.  Discussed results and f/u views. Increased stress related to this. Discussed.  Does not feel she needs anything more at this time.  Tries to stay active.  No chest pain or sob reported.  No abdominal pain or cramping reported.    Past Medical History:  Diagnosis Date  . Hypercholesterolemia   . Seasonal allergies    Past Surgical History:  Procedure Laterality Date  . ABDOMINAL HYSTERECTOMY    . BREAST EXCISIONAL BIOPSY Right 2003   neg  . COLONOSCOPY WITH PROPOFOL N/A 06/25/2018   Procedure: COLONOSCOPY WITH PROPOFOL;  Surgeon: Manya Silvas, MD;  Location: [facility] ENDOSCOPY;  Service: Endoscopy;  Laterality: N/A;  . PARTIAL HYSTERECTOMY  1988   secondary to fibroids and heavy bleeding and endometriosis  . TONSILLECTOMY     age 74   Family History  Problem Relation Age of Onset  . Hypercholesterolemia Mother   . Hypothyroidism Sister   . Hypercholesterolemia Sister   . Breast cancer Maternal Aunt 80  . Colon cancer Neg Hx    Social History   Socioeconomic History  . Marital status: Married    Spouse name: Not on file  . Number of children: 2  . Years of education: Not on  file  . Highest education level: Not on file  Occupational History    Employer: [employer]  Tobacco Use  . Smoking status: Never Smoker  . Smokeless tobacco: Never Used  Substance and Sexual Activity  . Alcohol use: Yes    Alcohol/week: 0.0 standard drinks    Comment: ocassionally  . Drug use: No  . Sexual activity: Not on file  Other Topics Concern  . Not on file  Social History Narrative  . Not on file   Social Determinants of Health   Financial Resource Strain: Not on file  Food Insecurity: Not on file  Transportation Needs: Not on file  Physical Activity: Not on file  Stress: Not on file  Social Connections: Not on file    Outpatient Encounter Medications as of 12/12/2020  Medication Sig  . alendronate (FOSAMAX) 70 MG tablet Take 1 tablet (70 mg total) by mouth once a week. Take with a full glass of water on an empty stomach.  Marland Kitchen aspirin EC 81 MG tablet Take 81 mg by mouth daily.  . Calcium Citrate-Vitamin D (CALCIUM CITRATE + D PO) Take by mouth daily.  . cetirizine (ZYRTEC) 10 MG tablet Take 10 mg by mouth daily.  . Cholecalciferol (VITAMIN D-3) 1000 UNITS CAPS Take by mouth 2 (two) times daily.  Marland Kitchen estradiol (ESTRACE VAGINAL) 0.1 MG/GM vaginal cream One applicator q hs x 5 nights and then 2x/week.  . fluticasone (FLONASE) 50 MCG/ACT  nasal spray Place 2 sprays into both nostrils daily.  . Glucosamine-Chondroitin (OSTEO BI-FLEX REGULAR STRENGTH PO) Take 1,500 mg by mouth daily.  . Multiple Vitamins-Minerals (CENTRUM SILVER ADULT 50+) TABS Take by mouth daily.  . Omega-3 Fatty Acids (FISH OIL) 1000 MG CAPS Take by mouth.  Marland Kitchen OVER THE COUNTER MEDICATION Tumeric OTC supplement  . Probiotic Product (PROBIOTIC-10 PO) Take by mouth daily.  . rosuvastatin (CRESTOR) 10 MG tablet TAKE 1 TABLET BY MOUTH EVERY DAY  . spironolactone (ALDACTONE) 50 MG tablet Take 50 mg by mouth daily.   No facility-administered encounter medications on file as of 12/12/2020.    Review of Systems   Constitutional: Negative for appetite change and unexpected weight change.  HENT: Negative for congestion and sinus pressure.   Respiratory: Negative for cough, chest tightness and shortness of breath.   Cardiovascular: Negative for chest pain, palpitations and leg swelling.  Gastrointestinal: Negative for abdominal pain, diarrhea, nausea and vomiting.  Genitourinary: Negative for difficulty urinating and dysuria.  Musculoskeletal: Negative for joint swelling and myalgias.  Skin: Negative for color change and rash.  Neurological: Negative for dizziness, light-headedness and headaches.  Psychiatric/Behavioral: Negative for agitation and dysphoric mood.       Objective:    Physical Exam Vitals reviewed.  Constitutional:      General: She is not in acute distress.    Appearance: Normal appearance.  HENT:     Head: Normocephalic and atraumatic.     Right Ear: External ear normal.     Left Ear: External ear normal.     Mouth/Throat:     Mouth: Oropharynx is clear and moist.  Eyes:     General: No scleral icterus.       Right eye: No discharge.        Left eye: No discharge.     Conjunctiva/sclera: Conjunctivae normal.  Neck:     Thyroid: No thyromegaly.  Cardiovascular:     Rate and Rhythm: Normal rate and regular rhythm.  Pulmonary:     Effort: No respiratory distress.     Breath sounds: Normal breath sounds. No wheezing.  Abdominal:     General: Bowel sounds are normal.     Palpations: Abdomen is soft.     Tenderness: There is no abdominal tenderness.  Musculoskeletal:        General: No swelling, tenderness or edema.     Cervical back: Neck supple. No tenderness.  Lymphadenopathy:     Cervical: No cervical adenopathy.  Skin:    Findings: No erythema or rash.  Neurological:     Mental Status: She is alert.  Psychiatric:        Mood and Affect: Mood normal.        Behavior: Behavior normal.     BP 128/62   Pulse 94   Temp 98.1 F (36.7 C) (Oral)   Resp 16    Wt 112 lb 9.6 oz (51.1 kg)   SpO2 99%   BMI 19.33 kg/m  Wt Readings from Last 3 Encounters:  12/12/20 112 lb 9.6 oz (51.1 kg)  08/04/20 114 lb 3.2 oz (51.8 kg)  07/13/20 113 lb 3.2 oz (51.3 kg)     Lab Results  Component Value Date   WBC 4.4 07/28/2020   HGB 14.8 07/28/2020   HCT 43.7 07/28/2020   PLT 231.0 07/28/2020   GLUCOSE 109 (H) 07/28/2020   CHOL 175 07/28/2020   TRIG 58.0 07/28/2020   HDL 61.20 07/28/2020   LDLDIRECT 168.1 04/17/2013  LDLCALC 102 (H) 07/28/2020   ALT 23 07/28/2020   AST 14 07/28/2020   NA 140 07/28/2020   K 4.0 07/28/2020   CL 102 07/28/2020   CREATININE 0.79 07/28/2020   BUN 19 07/28/2020   CO2 30 07/28/2020   TSH 2.58 07/28/2020   HGBA1C 6.0 07/28/2020    DG Bone Density  Result Date: 12/08/2020 EXAM: DUAL X-RAY ABSORPTIOMETRY (DXA) FOR BONE MINERAL DENSITY IMPRESSION: Your patient Hadessah Grennan completed a BMD test on 12/08/2020 using the [employer] (software version: 14.10) manufactured by [employer]. The following summarizes the results of our evaluation. Technologist: MTB PATIENT BIOGRAPHICAL: Name: Kamya, Watling Patient ID: [id number] Birth Date: June 27, 1951 Height: 64.0 in. Gender: Female Exam Date: 12/08/2020 Weight: 114.2 lbs. Indications: Hysterectomy, Postmenopausal Fractures: Treatments: calcium w/ vit D, Estradiol, Flonase, Multi-Vitamin DENSITOMETRY RESULTS: Site         Region      Measured Date Measured Age WHO Classification Young Adult T-score BMD         %Change vs. Previous Significant Change (*) AP Spine L1-L4 12/08/2020 68.1 Osteoporosis -3.8 0.722 g/cm2 - - DualFemur Total Right 12/08/2020 68.1 Osteoporosis -3.0 0.625 g/cm2 - - DualFemur Total Mean 12/08/2020 68.1 Osteoporosis -2.7 0.662 g/cm2 - - Left Forearm Radius 33% 12/08/2020 68.1 Osteoporosis -4.1 0.520 g/cm2 - - ASSESSMENT: The BMD measured at Forearm Radius 33% is 0.520 g/cm2 with a T-score of -4.1. This patient is considered OSTEOPOROTIC according to  [facility]) criteria. The scan quality is good. World Pharmacologist [facility]) criteria for post-menopausal, Caucasian Women: Normal:                   T-score at or above -1 SD Osteopenia/low bone mass: T-score between -1 and -2.5 SD Osteoporosis:             T-score at or below -2.5 SD RECOMMENDATIONS: 1. All patients should optimize calcium and vitamin D intake. 2. Consider FDA-approved medical therapies in postmenopausal women and men aged 34 years and older, based on the following: a. A hip or vertebral(clinical or morphometric) fracture b. T-score < -2.5 at the femoral neck or spine after appropriate evaluation to exclude secondary causes c. Low bone mass (T-score between -1.0 and -2.5 at the femoral neck or spine) and a 10-year probability of a hip fracture > 3% or a 10-year probability of a major osteoporosis-related fracture > 20% based on the [place]-adapted WHO algorithm 3. Clinician judgment and/or patient preferences may indicate treatment for people with 10-year fracture probabilities above or below these levels FOLLOW-UP: People with diagnosed cases of osteoporosis or at high risk for fracture should have regular bone mineral density tests. For patients eligible for Medicare, routine testing is allowed once every 2 years. The testing frequency can be increased to one year for patients who have rapidly progressing disease, those who are receiving or discontinuing medical therapy to restore bone mass, or have additional risk factors. I have reviewed this report, and agree with the above findings. Mark A. Thornton Papas, M.D. [facility] Electronically Signed   By: Lavonia Dana M.D.   On: 12/08/2020 13:36   MM 3D SCREEN BREAST BILATERAL  Result Date: 12/08/2020 CLINICAL DATA:  Screening. EXAM: DIGITAL SCREENING BILATERAL MAMMOGRAM WITH TOMO AND CAD COMPARISON:  Previous exam(s). ACR Breast Density Category c: The breast tissue is heterogeneously dense, which may obscure small  masses. FINDINGS: In the right breast, a possible mass and a separate asymmetry warrant further  evaluation. In the left breast, no findings suspicious for malignancy. Images were processed with CAD. IMPRESSION: Further evaluation is suggested for a possible mass and separate asymmetry in the right breast. RECOMMENDATION: Diagnostic mammogram and possibly ultrasound of the right breast. (Code:[id number]) The patient will be contacted regarding the findings, and additional imaging will be scheduled. BI-RADS CATEGORY  0: Incomplete. Need additional imaging evaluation and/or prior mammograms for comparison. Electronically Signed   By: Dorise Bullion III M.D   On: 12/08/2020 13:30       Assessment & Plan:   Problem List Items Addressed This Visit    Abnormal mammogram    Discussed results.  Plan for f/u views - ordered. Pt agreeable.       Osteoporosis    Discussed bone density results.  Discussed treatment options.  Take calcium with vitamin D bid.  Weight bearing exercise.  Will start fosamax as directed. Discussed possible side effects of the medication.  Follow.       Relevant Medications   alendronate (FOSAMAX) 70 MG tablet   Stress    Increased stress as outlined.  Discussed. Does not feel needs any further intervention at this time.  Follow.            Einar Pheasant, MD

## 2020-12-18 ENCOUNTER — Encounter: Payer: Self-pay | Admitting: Internal Medicine

## 2020-12-18 DIAGNOSIS — R928 Other abnormal and inconclusive findings on diagnostic imaging of breast: Secondary | ICD-10-CM | POA: Insufficient documentation

## 2020-12-18 DIAGNOSIS — M810 Age-related osteoporosis without current pathological fracture: Secondary | ICD-10-CM | POA: Insufficient documentation

## 2020-12-18 NOTE — Assessment & Plan Note (Addendum)
Discussed results.  Plan for f/u views - ordered. Pt agreeable.

## 2020-12-18 NOTE — Assessment & Plan Note (Signed)
Increased stress as outlined.  Discussed.  Does not feel needs any further intervention at this time.  Follow.  

## 2020-12-18 NOTE — Assessment & Plan Note (Signed)
Discussed bone density results.  Discussed treatment options.  Take calcium with vitamin D bid.  Weight bearing exercise.  Will start fosamax as directed. Discussed possible side effects of the medication.  Follow.

## 2020-12-19 ENCOUNTER — Other Ambulatory Visit: Payer: PPO

## 2020-12-19 ENCOUNTER — Ambulatory Visit: Payer: PPO

## 2020-12-22 ENCOUNTER — Ambulatory Visit
Admission: RE | Admit: 2020-12-22 | Discharge: 2020-12-22 | Disposition: A | Discharge status: Home / Self Care | Payer: PPO | Source: Ambulatory Visit | Attending: Internal Medicine | Admitting: Internal Medicine

## 2020-12-22 ENCOUNTER — Other Ambulatory Visit: Payer: Self-pay

## 2020-12-22 DIAGNOSIS — N6310 Unspecified lump in the right breast, unspecified quadrant: Secondary | ICD-10-CM | POA: Insufficient documentation

## 2020-12-22 DIAGNOSIS — N6489 Other specified disorders of breast: Secondary | ICD-10-CM | POA: Insufficient documentation

## 2020-12-22 DIAGNOSIS — R928 Other abnormal and inconclusive findings on diagnostic imaging of breast: Secondary | ICD-10-CM

## 2020-12-23 ENCOUNTER — Other Ambulatory Visit: Payer: PPO

## 2020-12-27 ENCOUNTER — Telehealth: Payer: Self-pay

## 2020-12-27 NOTE — Telephone Encounter (Signed)
Pt called and states that biopsy was supposed to be scheduled from [facility] but they never called. She called [facility] to check on this and they told her that Dr Nicki Reaper had to review it and give the OK? Pt is worried and would like this done ASAP. Please call pt back at your earliest convenience.

## 2020-12-28 ENCOUNTER — Other Ambulatory Visit: Payer: Self-pay | Admitting: Internal Medicine

## 2020-12-28 ENCOUNTER — Encounter: Payer: Self-pay | Admitting: Internal Medicine

## 2020-12-28 DIAGNOSIS — R928 Other abnormal and inconclusive findings on diagnostic imaging of breast: Secondary | ICD-10-CM

## 2020-12-28 NOTE — Telephone Encounter (Signed)
Called and spoke to pt about her mammogram results.  She is aware of radiology recommendation for biopsy.  Prefers to see Dr Bary Castilla.  Order placed for referral.

## 2020-12-28 NOTE — Telephone Encounter (Signed)
It looks like she had f/u views done on 1/20.

## 2020-12-28 NOTE — Progress Notes (Signed)
Order placed for surgery referral.  

## 2021-01-03 ENCOUNTER — Other Ambulatory Visit: Payer: Self-pay | Admitting: General Surgery

## 2021-01-03 DIAGNOSIS — N6311 Unspecified lump in the right breast, upper outer quadrant: Secondary | ICD-10-CM | POA: Diagnosis not present

## 2021-01-03 DIAGNOSIS — C50411 Malignant neoplasm of upper-outer quadrant of right female breast: Secondary | ICD-10-CM | POA: Diagnosis not present

## 2021-01-05 DIAGNOSIS — C50411 Malignant neoplasm of upper-outer quadrant of right female breast: Secondary | ICD-10-CM | POA: Diagnosis not present

## 2021-01-06 ENCOUNTER — Other Ambulatory Visit: Payer: Self-pay | Admitting: General Surgery

## 2021-01-06 DIAGNOSIS — C50411 Malignant neoplasm of upper-outer quadrant of right female breast: Secondary | ICD-10-CM

## 2021-01-06 NOTE — Progress Notes (Addendum)
Subjective:     Patient ID: Tracy Frey is a 70 y.o. female.  HPI  The following portions of the patient's history were reviewed and updated as appropriate.  This an established patient is here today for: office visit. The patient is here today to follow up from a recent right breast biopsy done on 01-03-21.  The patient is accompanied today by her husband, Joe.       Chief Complaint  Patient presents with  . Follow-up    right breast biopsy done 01-03-21     There were no vitals taken for this visit.      Past Medical History:  Diagnosis Date  . Environmental allergies 03/22/2013   Last Assessment & Plan:  Controls with otc meds (zyrtec).  No recent sinus issues.  . History of uterine fibroid   . Hypercholesterolemia 03/22/2013   Last Assessment & Plan:  She is watching her diet.  Exercising.  Follow lipid panel.          Past Surgical History:  Procedure Laterality Date  . ABDOMINAL HYSTERECTOMY  1988   partial  . BREAST EXCISIONAL BIOPSY Right 2003  . BREAST EXCISIONAL BIOPSY Right 01/03/2021  . COLONOSCOPY  11/06/2007   Int Hemorrhoids: [facility] 11/2017; Recall Ltr mailed 10/08/2017 (dw)  . COLONOSCOPY  06/25/2018   Internal Hemorrhoids: [facility] 06/2028  . TONSILLECTOMY  1959              OB History    Gravida  2   Para  2   Term      Preterm      AB      Living        SAB      IAB      Ectopic      Molar      Multiple      Live Births          Obstetric Comments  Age at first period 32 Age of first pregnancy 75        Social History          Socioeconomic History  . Marital status: Married    Spouse name: Not on file  . Number of children: Not on file  . Years of education: Not on file  . Highest education level: Not on file  Occupational History  . Not on file  Tobacco Use  . Smoking status: Never Smoker  . Smokeless tobacco: Never Used  Vaping Use  . Vaping Use: Never used  Substance and  Sexual Activity  . Alcohol use: Yes    Comment: Rare  . Drug use: Never  . Sexual activity: Not on file  Other Topics Concern  . Not on file  Social History Narrative  . Not on file   Social Determinants of Health   Financial Resource Strain: Not on file  Food Insecurity: Not on file  Transportation Needs: Not on file           Allergies  Allergen Reactions  . Chocolate Flavor Hives  . Sulfa (Sulfonamide Antibiotics) Hives    Current Medications        Current Outpatient Medications  Medication Sig Dispense Refill  . aspirin 81 MG EC tablet Take 81 mg by mouth once daily    . calcium citrate-vitamin D3 200-125 mg-unit Tab Take by mouth once daily Patient takes two tablets twice daily.      . cetirizine (ZYRTEC) 10 MG tablet Take 1 tablet  by mouth once daily    . clindamycin-benzoyl peroxide 1-5 % GlwP Apply topically once as needed    . doxycycline (VIBRAMYCIN) 100 MG capsule Take 100 mg by mouth once as needed    . FIBER, HERBAL, ORAL Take by mouth once daily    . glucosamine-D3-Boswellia serr (OSTEO BI-FLEX, 5-LOXIN,) 1,500-400-100 mg-unit-mg Tab Take 750 mg by mouth once daily Patient takes on tablet.      Jonn Shingles no.41/Bifidobact no.7 (PROBIOTIC-10 ORAL) Take by mouth once daily    . multivit-min-FA-lycopen-lutein 0.4-300-250 mg-mcg-mcg Tab Take by mouth once daily    . multivit-min/iron/folic/lutein (CENTRUM SILVER WOMEN ORAL) Take by mouth once daily    . rosuvastatin (CRESTOR) 10 MG tablet Take 10 mg by mouth once daily    . salmon oil/omega-3 fatty acids (SALMON OIL-1000 ORAL) Take by mouth once daily    . spironolactone (ALDACTONE) 50 MG tablet Take 50 mg by mouth once daily  6  . tretinoin (RETIN-A) 0.05 % cream Apply topically once daily as needed    . tumeric-ging-olive-oreg-capryl 100 mg-150 mg- 50 mg-150 mg Cap Take by mouth once daily    . cholecalciferol, vitamin D3, 5,000 unit Tab Take 1 tablet by mouth once daily  (Patient not taking: Reported on 01/05/2021  )    . glucosamine/chondr su A sod (GLUCOSAMINE-CHONDROITIN) 1,500-1,200 mg/30 mL Liqd Take by mouth once daily (Patient not taking: Reported on 01/05/2021  )    . Lactobacillus acidophilus 10 billion cell Cap Take by mouth once daily (Patient not taking: Reported on 01/03/2021  )     No current facility-administered medications for this visit.           Family History  Problem Relation Age of Onset  . Hyperlipidemia (Elevated cholesterol) Mother   . Hypothyroidism Sister   . Hyperlipidemia (Elevated cholesterol) Sister   . Breast cancer Maternal Aunt   . Colon cancer Neg Hx   . Colon polyps Neg Hx      Review of Systems  Constitutional: Negative for chills and fever.  Respiratory: Negative for cough.        Objective:   Physical Exam Exam conducted with a chaperone present.  Constitutional:      Appearance: Normal appearance.  Cardiovascular:     Rate and Rhythm: Normal rate and regular rhythm.     Heart sounds: Normal heart sounds.  Pulmonary:     Effort: Pulmonary effort is normal.     Breath sounds: Normal breath sounds.  Neurological:     Mental Status: She is alert and oriented to person, place, and time.  Psychiatric:        Mood and Affect: Mood normal.        Behavior: Behavior normal.  Chest: Nearly invisible incision near the apex of the axillary tail at the site of a 2001 biopsy.   Labs and Radiology:   Telephone report from pathology showed that the right upper outer quadrant breast biopsy represent an invasive mammary carcinoma.  Receptor status will be available next week.  8 mm on cores.  Corresponds to ultrasound imaging.  ovember 6, 2001 right axillary tail breast biopsy completed at [facility]: A.  "RIGHT BREAST" (WIRE GUIDED EXCISIONAL BIOPSY):     FOCAL AREA OF DENSE FIBROGLANDULAR BREAST TISSUE (APPROXIMATELY 2 CM IN    SIZE), NO EVIDENCE OF MALIGNANCY.      ONE INTRAMAMMARY LYMPH NODE (2.7 CM), NO EVIDENCE OF MALIGNANCY.  I certify that I personally conducted the  diagnostic evaluation of the above specimen(s) and have rendered the above diagnosis(es).    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    Plan:     The majority of the 45-minute visit related regarding options for management.  Breast conservation and mastectomy were presented is equivalent procedures.  Availability of second opinion for consultation was discussed.  An emphasis was made that there is not a medical sense of urgency and that she should take the time she needs to be comfortable with her decision.  With her daughter due to have her first child in about 7-8 weeks, this is weighing in on her decision.  Without having her receptor status, it is unclear whether there will be any role for adjuvant treatment, BA to chemotherapy or antiestrogen therapy.  There certainly would not be an indication for neoadjuvant chemotherapy based on her negative clinical axilla and the 8 mm tumor evident.  At this time, the patient is amenable to proceed to breast conservation surgery.  She was instructed on how to make use of EMLA cream prior to the procedure to minimize discomfort during sentinel node injection.  The role of sentinel node injection for identification of the lymph nodes was reviewed.  The patient has an incision across the upper axillary tail, and this may impact identification of the sentinel node.    Surgery to be arranged for 01-13-21 at [facility]. The patient has been instructed on the use of EMLA cream the morning of her surgery.     Entered by Ledell Noss, CMA, acting as a scribe for Dr. Hervey Ard, MD.   The documentation recorded by the scribe accurately reflects the service I personally performed and the decisions made by me.   Robert Bellow, MD FACS

## 2021-01-10 ENCOUNTER — Other Ambulatory Visit: Payer: Self-pay | Admitting: General Surgery

## 2021-01-10 ENCOUNTER — Encounter
Admission: RE | Admit: 2021-01-10 | Discharge: 2021-01-10 | Disposition: A | Discharge status: Home / Self Care | Payer: PPO | Source: Ambulatory Visit | Attending: General Surgery | Admitting: General Surgery

## 2021-01-10 ENCOUNTER — Other Ambulatory Visit: Payer: Self-pay

## 2021-01-10 DIAGNOSIS — C50411 Malignant neoplasm of upper-outer quadrant of right female breast: Secondary | ICD-10-CM

## 2021-01-10 NOTE — Patient Instructions (Signed)
Your procedure is scheduled on: 01/13/21 Report to   [facility]. DAY SURGERY [phone]   Remember: Instructions that are not followed completely may result in serious medical risk, up to and including death, or upon the discretion of your surgeon and anesthesiologist your surgery may need to be rescheduled.     _X__ 1. Do not eat food after midnight the night before your procedure.                 No gum chewing or hard candies. You may drink clear liquids up to 2 hours                 before you are scheduled to arrive for your surgery- DO not drink clear                 liquids within 2 hours of the start of your surgery.                 Clear Liquids include:  water, apple juice without pulp, clear carbohydrate                 drink such as Clearfast or Gatorade, Black Coffee or Tea (Do not add                 anything to coffee or tea). Diabetics water only  __X__2.  On the morning of surgery brush your teeth with toothpaste and water, you                 may rinse your mouth with mouthwash if you wish.  Do not swallow any              toothpaste of mouthwash.     _X__ 3.  No Alcohol for 24 hours before or after surgery.   _X__ 4.  Do Not Smoke or use e-cigarettes For 24 Hours Prior to Your Surgery.                 Do not use any chewable tobacco products for at least 6 hours prior to                 surgery.  ____  5.  Bring all medications with you on the day of surgery if instructed.   __X__  6.  Notify your doctor if there is any change in your medical condition      (cold, fever, infections).     Do not wear jewelry, make-up, hairpins, clips or nail polish. Do not wear lotions, powders, or perfumes. NO DEODORANT Do not shave 48 hours prior to surgery. Men may shave face and neck. Do not bring valuables to the hospital.    [facility] is not responsible for any belongings or valuables.  Contacts, dentures/partials or body piercings may not be worn  into surgery. Bring a case for your contacts, glasses or hearing aids, a denture cup will be supplied. Leave your suitcase in the car. After surgery it may be brought to your room. For patients admitted to the hospital, discharge time is determined by your treatment team.   Patients discharged the day of surgery will not be allowed to drive home.   Please read over the following fact sheets that you were given:   MRSA Information, CHG SOAP  __X__ Take these medicines the morning of surgery with A SIP OF WATER:    1. cetirizine (ZYRTEC) 10 MG tablet MAY TAKE IF  NEEDED  2.   3.   4.  5.  6.  ____ Fleet Enema (as directed)   __X__ Use CHG Soap/SAGE wipes as directed  ____ Use inhalers on the day of surgery  ____ Stop metformin/Janumet/Farxiga 2 days prior to surgery    ____ Take 1/2 of usual insulin dose the night before surgery. No insulin the morning          of surgery.   ____ Stop Blood Thinners Coumadin/Plavix/Xarelto/Pleta/Pradaxa/Eliquis/Effient/Aspirin  on   Or contact your Surgeon, Cardiologist or Medical Doctor regarding  ability to stop your blood thinners  __X__ Stop Anti-inflammatories 7 days before surgery such as Advil, Ibuprofen, Motrin,  BC or Goodies Powder, Naprosyn, Naproxen, Aleve, Aspirin    __X__ Stop all herbal supplements, fish oil or vitamin E until after surgery.    ____ Bring C-Pap to the hospital.

## 2021-01-11 ENCOUNTER — Encounter
Admission: RE | Admit: 2021-01-11 | Discharge: 2021-01-11 | Disposition: A | Discharge status: Home / Self Care | Payer: PPO | Source: Ambulatory Visit | Attending: General Surgery | Admitting: General Surgery

## 2021-01-11 ENCOUNTER — Other Ambulatory Visit: Payer: PPO

## 2021-01-11 DIAGNOSIS — Z01818 Encounter for other preprocedural examination: Secondary | ICD-10-CM | POA: Insufficient documentation

## 2021-01-11 DIAGNOSIS — Z20822 Contact with and (suspected) exposure to covid-19: Secondary | ICD-10-CM | POA: Diagnosis not present

## 2021-01-11 DIAGNOSIS — E785 Hyperlipidemia, unspecified: Secondary | ICD-10-CM | POA: Insufficient documentation

## 2021-01-11 DIAGNOSIS — Z136 Encounter for screening for cardiovascular disorders: Secondary | ICD-10-CM | POA: Diagnosis not present

## 2021-01-11 LAB — SARS CORONAVIRUS 2 (TAT 6-24 HRS): SARS Coronavirus 2: NEGATIVE

## 2021-01-13 ENCOUNTER — Encounter
Admission: RE | Admit: 2021-01-13 | Discharge: 2021-01-13 | Disposition: A | Discharge status: Home / Self Care | Payer: PPO | Source: Ambulatory Visit | Attending: General Surgery | Admitting: General Surgery

## 2021-01-13 ENCOUNTER — Ambulatory Visit: Payer: PPO | Admitting: Certified Registered"

## 2021-01-13 ENCOUNTER — Encounter
Admission: RE | Disposition: A | Discharge status: Home / Self Care | Payer: Self-pay | Source: Home / Self Care | Attending: General Surgery

## 2021-01-13 ENCOUNTER — Other Ambulatory Visit: Payer: Self-pay

## 2021-01-13 ENCOUNTER — Ambulatory Visit
Admission: RE | Admit: 2021-01-13 | Discharge: 2021-01-13 | Disposition: A | Discharge status: Home / Self Care | Payer: PPO | Attending: General Surgery | Admitting: General Surgery

## 2021-01-13 ENCOUNTER — Encounter: Payer: Self-pay | Admitting: General Surgery

## 2021-01-13 ENCOUNTER — Ambulatory Visit
Admission: RE | Admit: 2021-01-13 | Discharge: 2021-01-13 | Disposition: A | Discharge status: Home / Self Care | Payer: PPO | Source: Ambulatory Visit | Attending: General Surgery | Admitting: General Surgery

## 2021-01-13 DIAGNOSIS — Z90711 Acquired absence of uterus with remaining cervical stump: Secondary | ICD-10-CM | POA: Diagnosis not present

## 2021-01-13 DIAGNOSIS — C50411 Malignant neoplasm of upper-outer quadrant of right female breast: Secondary | ICD-10-CM | POA: Diagnosis not present

## 2021-01-13 DIAGNOSIS — Z882 Allergy status to sulfonamides status: Secondary | ICD-10-CM | POA: Diagnosis not present

## 2021-01-13 DIAGNOSIS — E7800 Pure hypercholesterolemia, unspecified: Secondary | ICD-10-CM | POA: Insufficient documentation

## 2021-01-13 DIAGNOSIS — Z7983 Long term (current) use of bisphosphonates: Secondary | ICD-10-CM | POA: Diagnosis not present

## 2021-01-13 DIAGNOSIS — Z7982 Long term (current) use of aspirin: Secondary | ICD-10-CM | POA: Diagnosis not present

## 2021-01-13 DIAGNOSIS — D360 Benign neoplasm of lymph nodes: Secondary | ICD-10-CM | POA: Diagnosis not present

## 2021-01-13 DIAGNOSIS — R928 Other abnormal and inconclusive findings on diagnostic imaging of breast: Secondary | ICD-10-CM | POA: Diagnosis not present

## 2021-01-13 DIAGNOSIS — C50911 Malignant neoplasm of unspecified site of right female breast: Secondary | ICD-10-CM | POA: Diagnosis not present

## 2021-01-13 DIAGNOSIS — Z79899 Other long term (current) drug therapy: Secondary | ICD-10-CM | POA: Insufficient documentation

## 2021-01-13 HISTORY — PX: BREAST LUMPECTOMY WITH SENTINEL LYMPH NODE BIOPSY: SHX5597

## 2021-01-13 SURGERY — BREAST LUMPECTOMY WITH SENTINEL LYMPH NODE BX
Anesthesia: General | Laterality: Right

## 2021-01-13 MED ORDER — FENTANYL CITRATE (PF) 100 MCG/2ML IJ SOLN
INTRAMUSCULAR | Status: AC
  Filled 2021-01-13: qty 2

## 2021-01-13 MED ORDER — SEVOFLURANE IN SOLN
RESPIRATORY_TRACT | Status: AC
  Filled 2021-01-13: qty 250

## 2021-01-13 MED ORDER — BUPIVACAINE-EPINEPHRINE (PF) 0.5% -1:200000 IJ SOLN
INTRAMUSCULAR | Status: DC | PRN
  Administered 2021-01-13: 18 mL via PERINEURAL

## 2021-01-13 MED ORDER — FAMOTIDINE 20 MG PO TABS
ORAL_TABLET | ORAL | Status: AC
  Filled 2021-01-13: qty 1

## 2021-01-13 MED ORDER — ACETAMINOPHEN 10 MG/ML IV SOLN
INTRAVENOUS | Status: DC | PRN
  Administered 2021-01-13: 750 mg via INTRAVENOUS

## 2021-01-13 MED ORDER — TRAMADOL HCL 50 MG PO TABS: 50.0000 mg | ORAL_TABLET | Freq: Four times a day (QID) | ORAL | 0 refills | Status: DC | PRN

## 2021-01-13 MED ORDER — CHLORHEXIDINE GLUCONATE 0.12 % MT SOLN
OROMUCOSAL | Status: AC
  Administered 2021-01-13: 15 mL via OROMUCOSAL
  Filled 2021-01-13: qty 15

## 2021-01-13 MED ORDER — CHLORHEXIDINE GLUCONATE CLOTH 2 % EX PADS: 6.0000 | MEDICATED_PAD | Freq: Once | CUTANEOUS | Status: DC

## 2021-01-13 MED ORDER — ACETAMINOPHEN 10 MG/ML IV SOLN
INTRAVENOUS | Status: AC
  Filled 2021-01-13: qty 100

## 2021-01-13 MED ORDER — LIDOCAINE HCL (CARDIAC) PF 100 MG/5ML IV SOSY
PREFILLED_SYRINGE | INTRAVENOUS | Status: DC | PRN
  Administered 2021-01-13: 60 mg via INTRAVENOUS

## 2021-01-13 MED ORDER — ORAL CARE MOUTH RINSE: 15.0000 mL | Freq: Once | OROMUCOSAL | Status: AC

## 2021-01-13 MED ORDER — PROPOFOL 10 MG/ML IV BOLUS
INTRAVENOUS | Status: DC | PRN
  Administered 2021-01-13: 120 mg via INTRAVENOUS

## 2021-01-13 MED ORDER — METHYLENE BLUE 0.5 % INJ SOLN
INTRAVENOUS | Status: DC | PRN
  Administered 2021-01-13: 5 mL via SUBMUCOSAL

## 2021-01-13 MED ORDER — BUPIVACAINE HCL (PF) 0.5 % IJ SOLN
INTRAMUSCULAR | Status: AC
  Filled 2021-01-13: qty 30

## 2021-01-13 MED ORDER — FENTANYL CITRATE (PF) 100 MCG/2ML IJ SOLN
INTRAMUSCULAR | Status: DC | PRN
  Administered 2021-01-13 (×2): 50 ug via INTRAVENOUS

## 2021-01-13 MED ORDER — PROPOFOL 10 MG/ML IV BOLUS
INTRAVENOUS | Status: AC
  Filled 2021-01-13: qty 20

## 2021-01-13 MED ORDER — CHLORHEXIDINE GLUCONATE CLOTH 2 % EX PADS
6.0000 | MEDICATED_PAD | Freq: Once | CUTANEOUS | Status: AC
  Administered 2021-01-13: 6 via TOPICAL

## 2021-01-13 MED ORDER — FAMOTIDINE 20 MG PO TABS
20.0000 mg | ORAL_TABLET | Freq: Once | ORAL | Status: AC
  Administered 2021-01-13: 20 mg via ORAL

## 2021-01-13 MED ORDER — ONDANSETRON HCL 4 MG/2ML IJ SOLN
INTRAMUSCULAR | Status: DC | PRN
  Administered 2021-01-13: 5 mg via INTRAVENOUS

## 2021-01-13 MED ORDER — TECHNETIUM TC 99M TILMANOCEPT KIT
1.0000 | PACK | Freq: Once | INTRAVENOUS | Status: AC | PRN
  Administered 2021-01-13: 0.995 via INTRADERMAL

## 2021-01-13 MED ORDER — PHENYLEPHRINE HCL (PRESSORS) 10 MG/ML IV SOLN
INTRAVENOUS | Status: DC | PRN
  Administered 2021-01-13 (×2): 100 ug via INTRAVENOUS

## 2021-01-13 MED ORDER — CHLORHEXIDINE GLUCONATE 0.12 % MT SOLN: 15.0000 mL | Freq: Once | OROMUCOSAL | Status: AC

## 2021-01-13 MED ORDER — EPINEPHRINE PF 1 MG/ML IJ SOLN
INTRAMUSCULAR | Status: AC
  Filled 2021-01-13: qty 1

## 2021-01-13 MED ORDER — LACTATED RINGERS IV SOLN: INTRAVENOUS | Status: DC

## 2021-01-13 MED ORDER — DEXAMETHASONE SODIUM PHOSPHATE 10 MG/ML IJ SOLN
INTRAMUSCULAR | Status: DC | PRN
  Administered 2021-01-13: 4 mg via INTRAVENOUS

## 2021-01-13 MED ORDER — METHYLENE BLUE 0.5 % INJ SOLN
INTRAVENOUS | Status: AC
  Filled 2021-01-13: qty 10

## 2021-01-13 MED ORDER — KETOROLAC TROMETHAMINE 30 MG/ML IJ SOLN
INTRAMUSCULAR | Status: DC | PRN
  Administered 2021-01-13: 15 mg via INTRAVENOUS

## 2021-01-13 SURGICAL SUPPLY — 55 items
BINDER BREAST LRG (GAUZE/BANDAGES/DRESSINGS) IMPLANT
BINDER BREAST MEDIUM (GAUZE/BANDAGES/DRESSINGS) IMPLANT
BINDER BREAST XLRG (GAUZE/BANDAGES/DRESSINGS) IMPLANT
BINDER BREAST XXLRG (GAUZE/BANDAGES/DRESSINGS) IMPLANT
BLADE BOVIE TIP EXT 4 (BLADE) IMPLANT
BLADE SURG 15 STRL SS SAFETY (BLADE) ×4 IMPLANT
BULB RESERV EVAC DRAIN JP 100C (MISCELLANEOUS) IMPLANT
CANISTER SUCT 1200ML W/VALVE (MISCELLANEOUS) ×2 IMPLANT
CHLORAPREP W/TINT 26 (MISCELLANEOUS) ×2 IMPLANT
CNTNR SPEC 2.5X3XGRAD LEK (MISCELLANEOUS)
CONT SPEC 4OZ STER OR WHT (MISCELLANEOUS)
CONTAINER SPEC 2.5X3XGRAD LEK (MISCELLANEOUS) IMPLANT
COVER PROBE FLX POLY STRL (MISCELLANEOUS) ×2 IMPLANT
COVER WAND RF STERILE (DRAPES) ×2 IMPLANT
DEVICE DUBIN SPECIMEN MAMMOGRA (MISCELLANEOUS) ×2 IMPLANT
DRAIN CHANNEL JP 15F RND 16 (MISCELLANEOUS) IMPLANT
DRAPE LAPAROTOMY TRNSV 106X77 (MISCELLANEOUS) ×2 IMPLANT
DRSG GAUZE FLUFF 36X18 (GAUZE/BANDAGES/DRESSINGS) ×4 IMPLANT
DRSG TELFA 3X8 NADH (GAUZE/BANDAGES/DRESSINGS) ×2 IMPLANT
ELECT CAUTERY BLADE TIP 2.5 (TIP) ×2
ELECT REM PT RETURN 9FT ADLT (ELECTROSURGICAL) ×2
ELECTRODE CAUTERY BLDE TIP 2.5 (TIP) ×1 IMPLANT
ELECTRODE REM PT RTRN 9FT ADLT (ELECTROSURGICAL) ×1 IMPLANT
GLOVE BIO SURGEON STRL SZ7.5 (GLOVE) ×2 IMPLANT
GLOVE INDICATOR 8.0 STRL GRN (GLOVE) ×2 IMPLANT
GOWN STRL REUS W/ TWL LRG LVL3 (GOWN DISPOSABLE) ×2 IMPLANT
GOWN STRL REUS W/TWL LRG LVL3 (GOWN DISPOSABLE) ×2
KIT TURNOVER KIT A (KITS) ×2 IMPLANT
LABEL OR SOLS (LABEL) ×2 IMPLANT
MANIFOLD NEPTUNE II (INSTRUMENTS) ×2 IMPLANT
MARGIN MAP 10MM (MISCELLANEOUS) ×2 IMPLANT
NEEDLE HYPO 22GX1.5 SAFETY (NEEDLE) ×2 IMPLANT
NEEDLE HYPO 25X1 1.5 SAFETY (NEEDLE) ×4 IMPLANT
PACK BASIN MINOR ARMC (MISCELLANEOUS) ×2 IMPLANT
RETRACTOR RING XSMALL (MISCELLANEOUS) IMPLANT
RTRCTR WOUND ALEXIS 13CM XS SH (MISCELLANEOUS)
SHEARS FOC LG CVD HARMONIC 17C (MISCELLANEOUS) IMPLANT
SHEARS HARMONIC 9CM CVD (BLADE) IMPLANT
SLEVE PROBE SENORX GAMMA FIND (MISCELLANEOUS) IMPLANT
STRIP CLOSURE SKIN 1/2X4 (GAUZE/BANDAGES/DRESSINGS) ×2 IMPLANT
SUT ETHILON 3-0 FS-10 30 BLK (SUTURE) ×2
SUT SILK 2 0 (SUTURE) ×1
SUT SILK 2-0 18XBRD TIE 12 (SUTURE) ×1 IMPLANT
SUT VIC AB 2-0 CT1 27 (SUTURE) ×2
SUT VIC AB 2-0 CT1 TAPERPNT 27 (SUTURE) ×2 IMPLANT
SUT VIC AB 3-0 SH 27 (SUTURE) ×2
SUT VIC AB 3-0 SH 27X BRD (SUTURE) ×2 IMPLANT
SUT VIC AB 4-0 FS2 27 (SUTURE) ×4 IMPLANT
SUT VICRYL+ 3-0 144IN (SUTURE) ×2 IMPLANT
SUTURE EHLN 3-0 FS-10 30 BLK (SUTURE) ×1 IMPLANT
SWABSTK COMLB BENZOIN TINCTURE (MISCELLANEOUS) ×2 IMPLANT
SYR 10ML LL (SYRINGE) ×2 IMPLANT
SYR BULB IRRIG 60ML STRL (SYRINGE) ×2 IMPLANT
TAPE TRANSPORE STRL 2 31045 (GAUZE/BANDAGES/DRESSINGS) ×2 IMPLANT
WATER STERILE IRR 1000ML POUR (IV SOLUTION) ×2 IMPLANT

## 2021-01-13 NOTE — Anesthesia Preprocedure Evaluation (Addendum)
Anesthesia Evaluation  Patient identified by MRN, date of birth, ID band Patient awake    Reviewed: Allergy & Precautions, H&P , NPO status , Patient's Chart, lab work & pertinent test results  History of Anesthesia Complications Negative for: history of anesthetic complications  Airway Mallampati: II  TM Distance: <3 FB     Dental  (+) Teeth Intact   Pulmonary neg pulmonary ROS, neg sleep apnea, neg COPD,    breath sounds clear to auscultation       Cardiovascular (-) angina(-) Past MI and (-) Cardiac Stents negative cardio ROS  (-) dysrhythmias  Rhythm:regular Rate:Normal     Neuro/Psych negative neurological ROS  negative psych ROS   GI/Hepatic negative GI ROS, Neg liver ROS,   Endo/Other  negative endocrine ROS  Renal/GU      Musculoskeletal   Abdominal   Peds  Hematology negative hematology ROS (+)   Anesthesia Other Findings Past Medical History: No date: Hypercholesterolemia No date: Seasonal allergies  Past Surgical History: No date: ABDOMINAL HYSTERECTOMY 2003: BREAST EXCISIONAL BIOPSY; Right     Comment:  neg No date: BREAST LUMPECTOMY; Right     Comment:  benign 06/25/2018: COLONOSCOPY WITH PROPOFOL; N/A     Comment:  Procedure: COLONOSCOPY WITH PROPOFOL;  Surgeon: Manya Silvas, MD;  Location: [facility] ENDOSCOPY;  Service:               Endoscopy;  Laterality: N/A; 1988: PARTIAL HYSTERECTOMY     Comment:  secondary to fibroids and heavy bleeding and               endometriosis No date: TONSILLECTOMY     Comment:  age 70  BMI    Body Mass Index: 19.05 kg/m      Reproductive/Obstetrics negative OB ROS                             Anesthesia Physical Anesthesia Plan  ASA: I  Anesthesia Plan: General LMA   Post-op Pain Management:    Induction:   PONV Risk Score and Plan: Dexamethasone, Ondansetron and Treatment may vary due to age or medical  condition  Airway Management Planned:   Additional Equipment:   Intra-op Plan:   Post-operative Plan:   Informed Consent: I have reviewed the patients History and Physical, chart, labs and discussed the procedure including the risks, benefits and alternatives for the proposed anesthesia with the patient or authorized representative who has indicated his/her understanding and acceptance.     Dental Advisory Given  Plan Discussed with: Anesthesiologist, CRNA and Surgeon  Anesthesia Plan Comments:        Anesthesia Quick Evaluation

## 2021-01-13 NOTE — Discharge Instructions (Signed)
AMBULATORY SURGERY  DISCHARGE INSTRUCTIONS  1) The drugs that you were given will stay in your system until tomorrow so for the next 24 hours you should not: A) Drive an automobile B) Make any legal decisions C) Drink any alcoholic beverage  2) You may resume regular meals tomorrow.  Today it is better to start with liquids and gradually work up to solid foods. You may eat anything you prefer, but it is better to start with liquids, then soup and crackers, and gradually work up to solid foods.  3) Please notify your doctor immediately if you have any unusual bleeding, trouble breathing, redness and pain at the surgery site, drainage, fever, or pain not relieved by medication.  Additional Instructions:  Please contact your physician with any problems or Same Day Surgery at [phone], Monday through Friday 6 am to 4 pm, or [facility] [place] Main number at [phone]. 

## 2021-01-13 NOTE — Op Note (Signed)
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  Postoperative diagnosis: Same.  Operative procedure: Right breast wide excision with tissue transfer, sentinel node biopsy x3.  Operating surgeon: Hervey Ard, MD.  Anesthesia: General by LMA, Marcaine 0.5% with 1: 200,000 units of epinephrine, 18 cc.  Estimated blood loss: Less than 5 cc.  Clinical note: This 70 year old woman recently had an abnormal mammogram with identification of an 8 mm lesion in the upper outer quadrant of the right breast.  Core biopsy showed evidence of invasive mammary carcinoma.  She was injected with technetium sulfur colloid prior to the procedure.  SCD stockings were used for DVT prevention.  There is no indication for antibiotic prophylaxis.  Operative note: After the induction of general anesthesia the skin around the areola was cleaned with alcohol and 5 cc of 0.5% methylene blue was instilled.  This was well-tolerated.  The breast, chest and axilla was then cleansed with ChloraPrep and draped.  Ultrasound was used to outline the margins of the malignancy as well as the hematoma that had developed post procedure.  The original ultrasound had shown the lesion coming fairly close to the skin and the hematoma brought this to about 3 mm.  Image was obtained for permanent record.  It was elected to excise a small ellipse of skin be sure to clear the anterior margin.  A curvilinear incision from the 10 to 12 o'clock position was made.  Skin was incised sharply after infiltration of local anesthetic.  Remaining dissection completed with electrocautery.  The lesion was excised and as the Faxitron failed the specimen was sent to radiology.  Image showed the tumor mass in the center of the specimen and clip slightly toward the medial border.  Gross examination by pathology showed margins clear.  While the breast specimen was being processed attention was turned to the axilla.  It was  elected to approach the axilla through the wide excision site.  The axillary envelope was opened.  3 blue lymphatics were leading into a 1 cm lymph node with counts of 14,000.  This with sentinel node #1.  2 smaller nodes about 3-4 mm in diameter with counts below the thousand were also identified and these were sent with the original specimen for routine analysis.  After achieving good hemostasis the axillary envelope was closed with a 2-0 Vicryl figure-of-eight suture.  The breast parenchyma was then approximated radially in the first 2 layers and then horizontally the most superficial layer to fill the dead space and minimize traction on the nipple areolar complex.  The adipose layer was approximated with interrupted 2-0 Vicryl sutures.  A flap of skin was made superior laterally to take tension off the areolar skin.  The skin was approximated with running 4-0 Vicryl subcuticular suture.  Benzoin and Steri-Strips followed by Telfa, fluff gauze and a compressive wrap were applied.  The patient tolerated the procedure well and was taken to recovery in stable condition.

## 2021-01-13 NOTE — Transfer of Care (Signed)
Immediate Anesthesia Transfer of Care Note  Patient: Tracy Frey  Procedure(s) Performed: BREAST LUMPECTOMY WITH SENTINEL LYMPH NODE BX (Right )  Patient Location: PACU  Anesthesia Type:General  Level of Consciousness: awake, alert  and oriented  Airway & Oxygen Therapy: Patient Spontanous Breathing and Patient connected to nasal cannula oxygen  Post-op Assessment: Report given to RN and Post -op Vital signs reviewed and stable  Post vital signs: Reviewed and stable  Last Vitals:  Vitals Value Taken Time  BP    Temp    Pulse    Resp    SpO2      Last Pain:  Vitals:   01/13/21 1047  TempSrc: Temporal  PainSc: 0-No pain         Complications: No complications documented.

## 2021-01-13 NOTE — H&P (Signed)
Tracy Frey [id number] [birth year]     HPI:  Healthy 70 y/o with recently diagnosed right cancer. Has elected breast conservation.   Medications Prior to Admission  Medication Sig Dispense Refill Last Dose  . alendronate (FOSAMAX) 70 MG tablet Take 1 tablet (70 mg total) by mouth once a week. Take with a full glass of water on an empty stomach. (Patient taking differently: Take 70 mg by mouth every Friday. Take with a full glass of water on an empty stomach.) 4 tablet 11 Past Week at Unknown time  . aspirin EC 81 MG tablet Take 81 mg by mouth daily.   Past Week at Unknown time  . Calcium Citrate-Vitamin D (CALCIUM CITRATE + D PO) Take 2 tablets by mouth 2 (two) times daily.   Past Week at Unknown time  . cetirizine (ZYRTEC) 10 MG tablet Take 10 mg by mouth daily.   01/12/2021 at Unknown time  . clindamycin-benzoyl peroxide (BENZACLIN) gel Apply 1 application topically 2 (two) times daily as needed (acne).   01/12/2021 at Unknown time  . diphenhydrAMINE (BENADRYL) 25 MG tablet Take 25 mg by mouth at bedtime as needed for allergies.   Past Week at Unknown time  . doxycycline (VIBRAMYCIN) 100 MG capsule Take 100 mg by mouth daily as needed (acne).   Past Month at Unknown time  . Fiber POWD Take 1 Dose by mouth daily. 1 dose = 1 tablespoon   01/12/2021 at Unknown time  . Glucosamine-Chondroitin (OSTEO BI-FLEX REGULAR STRENGTH PO) Take 1 tablet by mouth daily.   Past Week at Unknown time  . Multiple Vitamins-Minerals (CENTRUM SILVER ADULT 50+) TABS Take 1 tablet by mouth daily.   Past Week at Unknown time  . Omega-3 Fatty Acids (FISH OIL) 1000 MG CAPS Take 1,000 mg by mouth daily.   Past Week at Unknown time  . Probiotic Product (PROBIOTIC-10 PO) Take 1 capsule by mouth daily.   Past Week at Unknown time  . rosuvastatin (CRESTOR) 10 MG tablet TAKE 1 TABLET BY MOUTH EVERY DAY (Patient taking differently: Take 10 mg by mouth at bedtime.) 90 tablet 1 01/12/2021 at Unknown time  . spironolactone  (ALDACTONE) 50 MG tablet Take 50 mg by mouth daily.   01/12/2021 at Unknown time  . tretinoin (RETIN-A) 0.05 % cream Apply topically at bedtime as needed.   Past Month at Unknown time  . TURMERIC PO Take 1 tablet by mouth every other day.   Past Month at Unknown time  . estradiol (ESTRACE VAGINAL) 0.1 MG/GM vaginal cream One applicator q hs x 5 nights and then 2x/week. (Patient not taking: Reported on 01/05/2021) 42.5 g 2 Not Taking at Unknown time   Allergies  Allergen Reactions  . Chocolate Hives and Rash  . Sulfa Antibiotics Hives   Past Medical History:  Diagnosis Date  . Hypercholesterolemia   . Seasonal allergies    Past Surgical History:  Procedure Laterality Date  . ABDOMINAL HYSTERECTOMY    . BREAST EXCISIONAL BIOPSY Right 2003   neg  . BREAST LUMPECTOMY Right    benign  . COLONOSCOPY WITH PROPOFOL N/A 06/25/2018   Procedure: COLONOSCOPY WITH PROPOFOL;  Surgeon: Manya Silvas, MD;  Location: [facility] ENDOSCOPY;  Service: Endoscopy;  Laterality: N/A;  . PARTIAL HYSTERECTOMY  1988   secondary to fibroids and heavy bleeding and endometriosis  . TONSILLECTOMY     age 66   Social History   Socioeconomic History  . Marital status: Married    Spouse name: Not  on file  . Number of children: 2  . Years of education: Not on file  . Highest education level: Not on file  Occupational History    Employer: [employer]  Tobacco Use  . Smoking status: Never Smoker  . Smokeless tobacco: Never Used  Vaping Use  . Vaping Use: Never used  Substance and Sexual Activity  . Alcohol use: Yes    Alcohol/week: 0.0 standard drinks    Comment: ocassionally  . Drug use: No  . Sexual activity: Not on file  Other Topics Concern  . Not on file  Social History Narrative  . Not on file   Social Determinants of Health   Financial Resource Strain: Not on file  Food Insecurity: Not on file  Transportation Needs: Not on file  Physical Activity: Not on file  Stress: Not on file  Social  Connections: Not on file  Intimate Partner Violence: Not on file   Social History   Social History Narrative  . Not on file     ROS: Negative.     PE: HEENT: Negative. Lungs: Clear. Cardio: RR.  Assessment/Plan:  Proceed with planned right breast wide excision and SLN biopsy.   Forest Gleason [facility] 01/13/2021

## 2021-01-16 ENCOUNTER — Other Ambulatory Visit: Payer: Self-pay | Admitting: Anatomic Pathology & Clinical Pathology

## 2021-01-16 ENCOUNTER — Encounter: Payer: Self-pay | Admitting: General Surgery

## 2021-01-16 LAB — SURGICAL PATHOLOGY

## 2021-01-16 NOTE — Anesthesia Postprocedure Evaluation (Signed)
Anesthesia Post Note  Patient: Tracy Frey  Procedure(s) Performed: BREAST LUMPECTOMY WITH SENTINEL LYMPH NODE BX (Right )  Patient location during evaluation: PACU Anesthesia Type: General Level of consciousness: awake and alert Pain management: pain level controlled Vital Signs Assessment: post-procedure vital signs reviewed and stable Respiratory status: spontaneous breathing, nonlabored ventilation and respiratory function stable Cardiovascular status: blood pressure returned to baseline and stable Postop Assessment: no apparent nausea or vomiting Anesthetic complications: no   No complications documented.   Last Vitals:  Vitals:   01/13/21 1412 01/13/21 1426  BP: (!) 163/79 (!) (P) 141/76  Pulse: 76 (P) 89  Resp: 13   Temp: 37 C   SpO2: 100% (P) 100%    Last Pain:  Vitals:   01/13/21 1426  TempSrc:   PainSc: (P) 0-No pain                 [name] [place]

## 2021-01-20 ENCOUNTER — Other Ambulatory Visit: Payer: Self-pay | Admitting: General Surgery

## 2021-01-20 DIAGNOSIS — C50411 Malignant neoplasm of upper-outer quadrant of right female breast: Secondary | ICD-10-CM

## 2021-01-25 ENCOUNTER — Inpatient Hospital Stay: Payer: PPO | Admitting: Internal Medicine

## 2021-01-25 ENCOUNTER — Inpatient Hospital Stay: Payer: PPO

## 2021-01-25 DIAGNOSIS — Z170 Estrogen receptor positive status [ER+]: Secondary | ICD-10-CM | POA: Diagnosis not present

## 2021-01-25 DIAGNOSIS — C50411 Malignant neoplasm of upper-outer quadrant of right female breast: Secondary | ICD-10-CM | POA: Diagnosis not present

## 2021-01-26 ENCOUNTER — Encounter: Payer: Self-pay | Admitting: General Surgery

## 2021-02-06 ENCOUNTER — Encounter: Payer: Self-pay | Admitting: Internal Medicine

## 2021-02-06 ENCOUNTER — Inpatient Hospital Stay: Payer: PPO | Attending: Internal Medicine | Admitting: Internal Medicine

## 2021-02-06 ENCOUNTER — Encounter: Payer: Self-pay | Admitting: *Deleted

## 2021-02-06 ENCOUNTER — Encounter: Payer: Self-pay | Admitting: Radiation Oncology

## 2021-02-06 ENCOUNTER — Ambulatory Visit
Admission: RE | Admit: 2021-02-06 | Discharge: 2021-02-06 | Disposition: A | Discharge status: Home / Self Care | Payer: PPO | Source: Ambulatory Visit | Attending: Radiation Oncology | Admitting: Radiation Oncology

## 2021-02-06 ENCOUNTER — Inpatient Hospital Stay: Payer: PPO

## 2021-02-06 VITALS — BP 131/76 | HR 80 | Temp 96.0°F | Wt 111.0 lb

## 2021-02-06 DIAGNOSIS — Z803 Family history of malignant neoplasm of breast: Secondary | ICD-10-CM | POA: Insufficient documentation

## 2021-02-06 DIAGNOSIS — Z7982 Long term (current) use of aspirin: Secondary | ICD-10-CM | POA: Insufficient documentation

## 2021-02-06 DIAGNOSIS — C50411 Malignant neoplasm of upper-outer quadrant of right female breast: Secondary | ICD-10-CM

## 2021-02-06 DIAGNOSIS — Z8349 Family history of other endocrine, nutritional and metabolic diseases: Secondary | ICD-10-CM | POA: Diagnosis not present

## 2021-02-06 DIAGNOSIS — M810 Age-related osteoporosis without current pathological fracture: Secondary | ICD-10-CM | POA: Insufficient documentation

## 2021-02-06 DIAGNOSIS — E7800 Pure hypercholesterolemia, unspecified: Secondary | ICD-10-CM | POA: Diagnosis not present

## 2021-02-06 DIAGNOSIS — Z923 Personal history of irradiation: Secondary | ICD-10-CM | POA: Diagnosis not present

## 2021-02-06 DIAGNOSIS — Z79899 Other long term (current) drug therapy: Secondary | ICD-10-CM | POA: Diagnosis not present

## 2021-02-06 DIAGNOSIS — Z170 Estrogen receptor positive status [ER+]: Secondary | ICD-10-CM | POA: Diagnosis not present

## 2021-02-06 NOTE — Progress Notes (Signed)
Met patient today during her medical oncology consult with Dr. Rogue Bussing.  Patient is newly diagnosed with invasive mammary carcinoma of the right breast.  Per Dr. Rogue Bussing, patient has a low Oncotype Dx score and will be treated with radiation therapy and an antihormonal.  Gave patient breast cancer educational literature, "My Breast Cancer Treatment Handbook" by Josephine Igo, RN.  She was encouraged to call with any questions or needs.

## 2021-02-06 NOTE — Assessment & Plan Note (Addendum)
#   right breast cancer Stage IA ER/PR positive; her 2 NEG' Oncotype- low risk.  Discussed no benefit from adjuvant chemotherapy.  Discussed the role of adjuvant radiation; plan for breast radiation.  #Discussed the role and mechanism of action of aromatase inhibitors-with blocking of estrogen to prevent distant recurrence of breast cancer.  Also discussed the potential side effects including but not limited to arthralgias hot flashes and increased risk of osteoporosis.  Given the osteoporosis [see below] I discussed the option of tamoxifen. Long discussion regarding the potential adverse events on tamoxifen including but not limited to hot flashes, mood swings, thromboembolic events strokes and also small risk of uterine cancers [pt s/p TAH].  Patient is currently leaning towards anastrozole.  We will start post radiation.   # OSTEOPOROSIS JAN T score= - 4.0 on Fosomax [ JAN 8757]. Discussed the potential risk factors for osteoporosis- age/gender/postmenopausal status/use of anti-estrogen treatments. Discussed multiple options including exercise/ calcium and vitamin D supplementation/ and also use of bisphosphonates.  #  Discussed oral bisphosphonates versus parenteral bisphosphonate like Reclast/ RANK ligand inhibitors- desnosumab.  Patient is reluctant-for now we will continue oral Fosamax.  Discussed with patient's PCP.   # Genetics: # Genetics: Discussed with the patient majority of breast cancers are sporadic however 10 to 20% at risk of genetic/hereditary cancer syndromes.  Discussed importance of genetic counseling/genetic testing.  Patient is not interested in genetic counseling for now.  We will make a referral.  Thank you Dr. Bary Castilla for allowing me to participate in the care of your pleasant patient. Please do not hesitate to contact me with questions or concerns in the interim.  # DISPOSITION: # Follow up in 6 weeks MD; No labs- Dr.B

## 2021-02-06 NOTE — Progress Notes (Signed)
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   # OSTEOPOROSIS:BMD- T score= -4.1 [Fosomax- Jan 2022]     # SURVIVORSHIP:   # GENETICS:   DIAGNOSIS: Right breast cancer  STAGE:   I      ;  GOALS: cure  CURRENT/MOST RECENT THERAPY : RT/AI    Carcinoma of upper-outer quadrant of right breast in female, estrogen receptor positive ([place])  02/06/2021 Initial Diagnosis   Carcinoma of upper-outer quadrant of right breast in female, estrogen receptor positive ([place])   02/06/2021 Cancer Staging   Staging form: Breast, AJCC 8th Edition - Pathologic: Stage IA (pT1c, pN0, cM0, G1, ER+, PR+, HER2-) - Signed by Cammie Sickle, MD on 02/06/2021 Stage prefix: Initial diagnosis Histologic grading system: 3 grade system Menopausal status: Postmenopausal      HISTORY OF PRESENTING ILLNESS:  Tracy Frey 71 y.o.  female female with no prior history of breast cancer/or malignancies has been referred to [place] for further evaluation recommendations for new diagnosis of breast cancer.  Patient states she was found to have an abnormal screening mammogram which led to diagnostic mammogram/ultrasound/followed by biopsy-as summarized above.  Review of Systems  Constitutional: Negative for chills, diaphoresis, fever, malaise/fatigue and weight loss.  HENT: Negative for nosebleeds and sore throat.   Eyes: Negative for double vision.  Respiratory: Negative for cough, hemoptysis, sputum production, shortness of breath and wheezing.   Cardiovascular: Negative for chest pain, palpitations,  orthopnea and leg swelling.  Gastrointestinal: Negative for abdominal pain, blood in stool, constipation, diarrhea, heartburn, melena, nausea and vomiting.  Genitourinary: Negative for dysuria, frequency and urgency.  Musculoskeletal: Negative for back pain and joint pain.  Skin: Negative.  Negative for itching and rash.  Neurological: Negative for dizziness, tingling, focal weakness, weakness and headaches.  Endo/Heme/Allergies: Does not bruise/bleed easily.  Psychiatric/Behavioral: Negative for depression. The patient is not nervous/anxious and does not have insomnia.      MEDICAL HISTORY:  Past Medical History:  Diagnosis Date  . Hypercholesterolemia   . Seasonal allergies     SURGICAL HISTORY: Past Surgical History:  Procedure Laterality Date  . ABDOMINAL HYSTERECTOMY    . BREAST EXCISIONAL BIOPSY Right 2003   neg  . BREAST LUMPECTOMY Right    benign  . BREAST LUMPECTOMY WITH SENTINEL LYMPH NODE BIOPSY Right 01/13/2021   Procedure: BREAST LUMPECTOMY WITH SENTINEL LYMPH NODE BX;  Surgeon: Robert Bellow, MD;  Location: [facility] ORS;  Service: General;  Laterality: Right;  . COLONOSCOPY WITH PROPOFOL N/A 06/25/2018   Procedure: COLONOSCOPY WITH PROPOFOL;  Surgeon: Manya Silvas, MD;  Location: [facility] ENDOSCOPY;  Service: Endoscopy;  Laterality: N/A;  . PARTIAL HYSTERECTOMY  1988   secondary to fibroids and heavy bleeding and endometriosis  . TONSILLECTOMY     age 2    SOCIAL HISTORY: Social History   Socioeconomic History  . Marital status: Married    Spouse name: Not on file  . Number of children: 2  . Years of education: Not on file  . Highest education level: Not on file  Occupational History    Employer: [employer]  Tobacco Use  .  Smoking status: Never Smoker  . Smokeless tobacco: Never Used  Vaping Use  . Vaping Use: Never used  Substance and Sexual Activity  . Alcohol use: Yes    Alcohol/week: 0.0 standard drinks    Comment: ocassionally  . Drug use: No  .  Sexual activity: Not on file  Other Topics Concern  . Not on file  Social History Narrative   Lives in [place] with husband; 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    Social Determinants of Health   Financial Resource Strain: Not on file  Food Insecurity: Not on file  Transportation Needs: Not on file  Physical Activity: Not on file  Stress: Not on file  Social Connections: Not on file  Intimate Partner Violence: Not on file    FAMILY HISTORY: Family History  Problem Relation Age of Onset  . Hypercholesterolemia Mother   . Hypothyroidism Sister   . Hypercholesterolemia Sister   . Breast cancer Maternal Aunt 80  . Colon cancer Neg Hx     ALLERGIES:  is allergic to chocolate and sulfa antibiotics.  MEDICATIONS:  Current Outpatient Medications  Medication Sig Dispense Refill  . alendronate (FOSAMAX) 70 MG tablet Take 1 tablet (70 mg total) by mouth once a week. Take with a full glass of water on an empty stomach. (Patient taking differently: Take 70 mg by mouth every Friday. Take with a full glass of water on an empty stomach.) 4 tablet 11  . aspirin EC 81 MG tablet Take 81 mg by mouth daily.    . Calcium Citrate-Vitamin D (CALCIUM CITRATE + D PO) Take 2 tablets by mouth 2 (two) times daily.    . cetirizine (ZYRTEC) 10 MG tablet Take 10 mg by mouth daily.    . clindamycin-benzoyl peroxide (BENZACLIN) gel Apply 1 application topically 2 (two) times daily as needed (acne).    . Cranberry 1000 MG CAPS Take 1 tablet by mouth every other day.    . diphenhydrAMINE (BENADRYL) 25 MG tablet Take 25 mg by mouth at bedtime as needed for allergies.    Marland Kitchen doxycycline (VIBRAMYCIN) 100 MG capsule Take 100 mg by mouth daily as needed (acne).    Marland Kitchen estradiol (ESTRACE VAGINAL) 0.1 MG/GM vaginal cream One applicator q hs x 5 nights and then 2x/week. 42.5 g 2  . Fiber POWD Take 1 Dose by mouth daily. 1 dose = 1 tablespoon    . Glucosamine-Chondroitin (OSTEO BI-FLEX REGULAR  STRENGTH PO) Take 1 tablet by mouth daily.    . Multiple Vitamins-Minerals (CENTRUM SILVER ADULT 50+) TABS Take 1 tablet by mouth daily.    . Omega-3 Fatty Acids (FISH OIL) 1000 MG CAPS Take 1,000 mg by mouth daily.    . Probiotic Product (PROBIOTIC-10 PO) Take 1 capsule by mouth daily.    . rosuvastatin (CRESTOR) 10 MG tablet TAKE 1 TABLET BY MOUTH EVERY DAY (Patient taking differently: Take 10 mg by mouth at bedtime.) 90 tablet 1  . spironolactone (ALDACTONE) 50 MG tablet Take 50 mg by mouth daily.    . traMADol (ULTRAM) 50 MG tablet Take 1 tablet (50 mg total) by mouth every 6 (six) hours as needed. 10 tablet 0  . tretinoin (RETIN-A) 0.05 % cream Apply topically at bedtime as needed.    . TURMERIC PO Take 1 tablet by mouth every other day.     No current facility-administered medications for this visit.      Marland Kitchen  PHYSICAL EXAMINATION: ECOG PERFORMANCE STATUS: 0 - Asymptomatic  Vitals:   02/06/21 1034  BP: 131/76  Pulse: 80  Resp: 16  Temp: (!) 96 F (35.6 C)  SpO2: 98%   Filed Weights   02/06/21 1034  Weight: 111 lb (50.3 kg)    Physical Exam HENT:     Head: Normocephalic and atraumatic.     Mouth/Throat:     Mouth: Oropharynx is clear and moist.     Pharynx: No oropharyngeal exudate.  Eyes:     Pupils: Pupils are equal, round, and reactive to light.  Cardiovascular:     Rate and Rhythm: Normal rate and regular rhythm.  Pulmonary:     Effort: Pulmonary effort is normal. No respiratory distress.     Breath sounds: Normal breath sounds. No wheezing.  Abdominal:     General: Bowel sounds are normal. There is no distension.     Palpations: Abdomen is soft. There is no mass.     Tenderness: There is no abdominal tenderness. There is no guarding or rebound.  Musculoskeletal:        General: No tenderness or edema. Normal range of motion.     Cervical back: Normal range of motion and neck supple.  Skin:    General: Skin is warm.  Neurological:     Mental Status: She  is alert and oriented to person, place, and time.  Psychiatric:        Mood and Affect: Affect normal.      LABORATORY DATA:  I have reviewed the data as listed Lab Results  Component Value Date   WBC 4.4 07/28/2020   HGB 14.8 07/28/2020   HCT 43.7 07/28/2020   MCV 94.6 07/28/2020   PLT 231.0 07/28/2020   Recent Labs    07/28/20 0838  NA 140  K 4.0  CL 102  CO2 30  GLUCOSE 109*  BUN 19  CREATININE 0.79  CALCIUM 10.3  PROT 6.6  ALBUMIN 4.5  AST 14  ALT 23  ALKPHOS 32*  BILITOT 0.8    RADIOGRAPHIC STUDIES: I have personally reviewed the radiological images as listed and agreed with the findings in the report. NM Sentinel Node Inj-No Rpt (Breast)  Result Date: 01/13/2021 Sulfur colloid was injected by the nuclear medicine technologist for melanoma sentinel node.   MM Breast Surgical Specimen  Result Date: 01/13/2021 CLINICAL DATA:  Post lumpectomy specimen radiograph. EXAM: SPECIMEN RADIOGRAPH OF THE RIGHT BREAST COMPARISON:  Previous exam(s). FINDINGS: Status post excision of the right breast. The coil biopsy marker clip is present and the specimen is marked for pathology. IMPRESSION: Specimen radiograph of the right breast. Electronically Signed   By: Audie Pinto M.D.   On: 01/13/2021 13:04    ASSESSMENT & PLAN:   Carcinoma of upper-outer quadrant of right breast in female, estrogen receptor positive ([place]) # right breast cancer Stage IA ER/PR positive; her 2 NEG' Oncotype- low risk.  Discussed no benefit from adjuvant chemotherapy.  Discussed the role of adjuvant radiation; plan for breast radiation.  #Discussed the role and mechanism of action of aromatase inhibitors-with blocking of estrogen to prevent distant recurrence of breast cancer.  Also discussed the potential side effects including but not limited to arthralgias hot flashes and increased risk of osteoporosis.  Given the osteoporosis [see below] I discussed the option of tamoxifen. Long discussion  regarding the potential adverse events on tamoxifen including but not limited to hot flashes, mood swings, thromboembolic events strokes and also small risk of uterine cancers [pt s/p TAH].  Patient is currently  leaning towards anastrozole.  We will start post radiation.   # OSTEOPOROSIS JAN T score= - 4.0 on Fosomax [ JAN 0737]. Discussed the potential risk factors for osteoporosis- age/gender/postmenopausal status/use of anti-estrogen treatments. Discussed multiple options including exercise/ calcium and vitamin D supplementation/ and also use of bisphosphonates.  #  Discussed oral bisphosphonates versus parenteral bisphosphonate like Reclast/ RANK ligand inhibitors- desnosumab.  Patient is reluctant-for now we will continue oral Fosamax.  Discussed with patient's PCP.   # Genetics: # Genetics: Discussed with the patient majority of breast cancers are sporadic however 10 to 20% at risk of genetic/hereditary cancer syndromes.  Discussed importance of genetic counseling/genetic testing.  Patient is not interested in genetic counseling for now.  We will make a referral.  Thank you Dr. Bary Castilla for allowing me to participate in the care of your pleasant patient. Please do not hesitate to contact me with questions or concerns in the interim.  # DISPOSITION: # Follow up in 6 weeks MD; No labs- Dr.B  All questions were answered. The patient/family knows to call the clinic with any problems, questions or concerns.    Cammie Sickle, MD 02/06/2021 12:16 PM

## 2021-02-06 NOTE — Consult Note (Signed)
NEW PATIENT EVALUATION  Name: Tracy Frey  MRN: [id number]  Date:   02/06/2021     DOB: [birth year]   This 70 y.o. female patient presents to the clinic for initial evaluation of stage Ia (T1 cN0 M0) ER/PR positive.  Invasive mammary carcinoma of the right breast status post wide local excision and sentinel node biopsy  REFERRING PHYSICIAN: Einar Pheasant, MD  CHIEF COMPLAINT:  Chief Complaint  Patient presents with  . Consult    DIAGNOSIS: The encounter diagnosis was Carcinoma of upper-outer quadrant of right breast in female, estrogen receptor positive ([place]).   PREVIOUS INVESTIGATIONS:  Mammogram and ultrasound reviewed Clinical notes reviewed Pathology report reviewed  HPI: Patient is a 70 year old female who presents with an abnormal mammogram of the right breast showing suspicious mass in the right breast at 11 o'clock position measuring 0.9 cm.  There are indeterminate right axillary lymph nodes with mild cortical thickening.  She underwent ultrasound-guided biopsy which was positive for invasive mammary carcinoma.  She went on to have a wide local excision and sentinel node biopsy showing a 1.1 cm invasive mammary carcinoma no special type with margins clear at 3 mm.  3 lymph nodes were negative for metastatic disease tumor was strongly ER/PR positive HER-2/neu not overexpressed.  She had Oncotype DX performed showing recurrence with score of 8 showing no benefit to systemic chemotherapy she is now referred to radiation oncology for consideration of treatment.  She is doing well specifically denies breast tenderness cough or bone pain.  PLANNED TREATMENT REGIMEN: Right hypofractionated whole breast radiation  PAST MEDICAL HISTORY:  has a past medical history of Hypercholesterolemia and Seasonal allergies.    PAST SURGICAL HISTORY:  Past Surgical History:  Procedure Laterality Date  . ABDOMINAL HYSTERECTOMY    . BREAST EXCISIONAL BIOPSY Right 2003   neg  . BREAST  LUMPECTOMY Right    benign  . BREAST LUMPECTOMY WITH SENTINEL LYMPH NODE BIOPSY Right 01/13/2021   Procedure: BREAST LUMPECTOMY WITH SENTINEL LYMPH NODE BX;  Surgeon: Robert Bellow, MD;  Location: [facility] ORS;  Service: General;  Laterality: Right;  . COLONOSCOPY WITH PROPOFOL N/A 06/25/2018   Procedure: COLONOSCOPY WITH PROPOFOL;  Surgeon: Manya Silvas, MD;  Location: [facility] ENDOSCOPY;  Service: Endoscopy;  Laterality: N/A;  . PARTIAL HYSTERECTOMY  1988   secondary to fibroids and heavy bleeding and endometriosis  . TONSILLECTOMY     age 67    FAMILY HISTORY: family history includes Breast cancer (age of onset: 50) in her maternal aunt; Hypercholesterolemia in her mother and sister; Hypothyroidism in her sister.  SOCIAL HISTORY:  reports that she has never smoked. She has never used smokeless tobacco. She reports current alcohol use. She reports that she does not use drugs.  ALLERGIES: Chocolate and Sulfa antibiotics  MEDICATIONS:  Current Outpatient Medications  Medication Sig Dispense Refill  . alendronate (FOSAMAX) 70 MG tablet Take 1 tablet (70 mg total) by mouth once a week. Take with a full glass of water on an empty stomach. (Patient taking differently: Take 70 mg by mouth every Friday. Take with a full glass of water on an empty stomach.) 4 tablet 11  . aspirin EC 81 MG tablet Take 81 mg by mouth daily.    . Calcium Citrate-Vitamin D (CALCIUM CITRATE + D PO) Take 2 tablets by mouth 2 (two) times daily.    . cetirizine (ZYRTEC) 10 MG tablet Take 10 mg by mouth daily.    . clindamycin-benzoyl peroxide (BENZACLIN) gel Apply  1 application topically 2 (two) times daily as needed (acne).    . Cranberry 1000 MG CAPS Take 1 tablet by mouth every other day.    . diphenhydrAMINE (BENADRYL) 25 MG tablet Take 25 mg by mouth at bedtime as needed for allergies.    Marland Kitchen doxycycline (VIBRAMYCIN) 100 MG capsule Take 100 mg by mouth daily as needed (acne).    Marland Kitchen estradiol (ESTRACE VAGINAL) 0.1  MG/GM vaginal cream One applicator q hs x 5 nights and then 2x/week. 42.5 g 2  . Fiber POWD Take 1 Dose by mouth daily. 1 dose = 1 tablespoon    . Glucosamine-Chondroitin (OSTEO BI-FLEX REGULAR STRENGTH PO) Take 1 tablet by mouth daily.    . Multiple Vitamins-Minerals (CENTRUM SILVER ADULT 50+) TABS Take 1 tablet by mouth daily.    . Omega-3 Fatty Acids (FISH OIL) 1000 MG CAPS Take 1,000 mg by mouth daily.    . Probiotic Product (PROBIOTIC-10 PO) Take 1 capsule by mouth daily.    . rosuvastatin (CRESTOR) 10 MG tablet TAKE 1 TABLET BY MOUTH EVERY DAY (Patient taking differently: Take 10 mg by mouth at bedtime.) 90 tablet 1  . spironolactone (ALDACTONE) 50 MG tablet Take 50 mg by mouth daily.    . traMADol (ULTRAM) 50 MG tablet Take 1 tablet (50 mg total) by mouth every 6 (six) hours as needed. 10 tablet 0  . tretinoin (RETIN-A) 0.05 % cream Apply topically at bedtime as needed.    . TURMERIC PO Take 1 tablet by mouth every other day.     No current facility-administered medications for this encounter.    ECOG PERFORMANCE STATUS:  0 - Asymptomatic  REVIEW OF SYSTEMS: Patient denies any weight loss, fatigue, weakness, fever, chills or night sweats. Patient denies any loss of vision, blurred vision. Patient denies any ringing  of the ears or hearing loss. No irregular heartbeat. Patient denies heart murmur or history of fainting. Patient denies any chest pain or pain radiating to her upper extremities. Patient denies any shortness of breath, difficulty breathing at night, cough or hemoptysis. Patient denies any swelling in the lower legs. Patient denies any nausea vomiting, vomiting of blood, or coffee ground material in the vomitus. Patient denies any stomach pain. Patient states has had normal bowel movements no significant constipation or diarrhea. Patient denies any dysuria, hematuria or significant nocturia. Patient denies any problems walking, swelling in the joints or loss of balance. Patient  denies any skin changes, loss of hair or loss of weight. Patient denies any excessive worrying or anxiety or significant depression. Patient denies any problems with insomnia. Patient denies excessive thirst, polyuria, polydipsia. Patient denies any swollen glands, patient denies easy bruising or easy bleeding. Patient denies any recent infections, allergies or URI. Patient "s visual fields have not changed significantly in recent time.   PHYSICAL EXAM: BP 131/76   Pulse 80   Temp (!) 96 F (35.6 C) (Tympanic)   Wt 111 lb (50.3 kg)   BMI 19.05 kg/m  Patient status post wide local excision and sentinel node biopsy the right breast incisions are healing well breast is still firm node dominant masses noted in either breast.  No axillary or supraclavicular adenopathy is appreciated.  Well-developed well-nourished patient in NAD. HEENT reveals PERLA, EOMI, discs not visualized.  Oral cavity is clear. No oral mucosal lesions are identified. Neck is clear without evidence of cervical or supraclavicular adenopathy. Lungs are clear to A&P. Cardiac examination is essentially unremarkable with regular rate and rhythm  without murmur rub or thrill. Abdomen is benign with no organomegaly or masses noted. Motor sensory and DTR levels are equal and symmetric in the upper and lower extremities. Cranial nerves II through XII are grossly intact. Proprioception is intact. No peripheral adenopathy or edema is identified. No motor or sensory levels are noted. Crude visual fields are within normal range.  LABORATORY DATA: Pathology report reviewed    RADIOLOGY RESULTS: Mammograms and ultrasound reviewed compatible with above-stated findings   IMPRESSION: Stage Ia ER/PR positive invasive mammary carcinoma the right breast status post wide local excision and sentinel node biopsy ER/PR positive in 70 year old female  PLAN: This time like to go ahead with hypofractionated course of radiation therapy to her right breast  over 3 weeks would also boost her scar another 1000 cGy using electron beam.  Risks and benefits of treatment [place] skin reaction fatigue alteration of blood counts possible inclusion of superficial lung all were reviewed in detail with the patient.  I have personally set up and ordered CT simulation for later this week.  Patient also will be a candidate for antiestrogen therapy after completion of radiation.  Patient husband both comprehend my treatment plan well.  I would like to take this opportunity to thank you for allowing me to participate in the care of your patient.Noreene Filbert, MD

## 2021-02-09 ENCOUNTER — Other Ambulatory Visit: Payer: Self-pay | Admitting: *Deleted

## 2021-02-09 ENCOUNTER — Telehealth: Payer: Self-pay | Admitting: Internal Medicine

## 2021-02-09 ENCOUNTER — Ambulatory Visit
Admission: RE | Admit: 2021-02-09 | Discharge: 2021-02-09 | Disposition: A | Discharge status: Home / Self Care | Payer: PPO | Source: Ambulatory Visit | Attending: Radiation Oncology | Admitting: Radiation Oncology

## 2021-02-09 ENCOUNTER — Telehealth: Payer: Self-pay | Admitting: *Deleted

## 2021-02-09 DIAGNOSIS — E7800 Pure hypercholesterolemia, unspecified: Secondary | ICD-10-CM

## 2021-02-09 DIAGNOSIS — Z170 Estrogen receptor positive status [ER+]: Secondary | ICD-10-CM | POA: Diagnosis not present

## 2021-02-09 DIAGNOSIS — R739 Hyperglycemia, unspecified: Secondary | ICD-10-CM

## 2021-02-09 DIAGNOSIS — Z510 Encounter for antineoplastic radiation therapy: Secondary | ICD-10-CM | POA: Insufficient documentation

## 2021-02-09 DIAGNOSIS — C50411 Malignant neoplasm of upper-outer quadrant of right female breast: Secondary | ICD-10-CM | POA: Insufficient documentation

## 2021-02-09 DIAGNOSIS — R319 Hematuria, unspecified: Secondary | ICD-10-CM

## 2021-02-09 NOTE — Telephone Encounter (Signed)
Noted  

## 2021-02-09 NOTE — Telephone Encounter (Signed)
Please place future orders for lab appt.   Appt note states "fasting labs"  Only urine ordered

## 2021-02-09 NOTE — Telephone Encounter (Signed)
-----   Message from Cammie Sickle, MD sent at 02/06/2021  5:19 PM EST ----- Regarding: RE: osteoporosis I would appreciate if you can reach out to her, as you have known her longer than myself.  GB ----- Message ----- From: Einar Pheasant, MD Sent: 02/06/2021  12:57 PM EST To: Cammie Sickle, MD Subject: RE: osteoporosis                               I am ok with prolia or reclast.  I can talk with her more about this if you need me to.  Just let me know.  Thank you for seeing her and taking care of her.   Avish Torry  ----- Message ----- From: Cammie Sickle, MD Sent: 02/06/2021  12:20 PM EST To: Einar Pheasant, MD Subject: osteoporosis                                   Hi Dr.Zalma Channing:  As you might recall, this patient was started on Fosamax recently.    With her severe osteoporosis and the fact that she will be on medications that could enhance osteoporosis-do you have a strong feelings against Prolia or Reclast?  In general I prefer parenteral medications although not totally against p.o. bisphosphonates.  Appreciate your thoughts.  Thanks, GB

## 2021-02-09 NOTE — Telephone Encounter (Signed)
Received notification to discuss other treatment options with Tracy Frey.  Please add to schedule to discuss osteoporosis treatment.  Was not sure who to send to.  Thanks

## 2021-02-10 DIAGNOSIS — C50411 Malignant neoplasm of upper-outer quadrant of right female breast: Secondary | ICD-10-CM | POA: Diagnosis not present

## 2021-02-10 DIAGNOSIS — Z510 Encounter for antineoplastic radiation therapy: Secondary | ICD-10-CM | POA: Diagnosis not present

## 2021-02-10 DIAGNOSIS — Z170 Estrogen receptor positive status [ER+]: Secondary | ICD-10-CM | POA: Diagnosis not present

## 2021-02-10 NOTE — Telephone Encounter (Signed)
Order placed for f/u labs.  

## 2021-02-13 ENCOUNTER — Other Ambulatory Visit: Payer: Self-pay

## 2021-02-13 ENCOUNTER — Other Ambulatory Visit (INDEPENDENT_AMBULATORY_CARE_PROVIDER_SITE_OTHER): Payer: PPO

## 2021-02-13 DIAGNOSIS — R739 Hyperglycemia, unspecified: Secondary | ICD-10-CM

## 2021-02-13 DIAGNOSIS — E7800 Pure hypercholesterolemia, unspecified: Secondary | ICD-10-CM

## 2021-02-13 DIAGNOSIS — R319 Hematuria, unspecified: Secondary | ICD-10-CM

## 2021-02-13 LAB — HEPATIC FUNCTION PANEL
ALT: 20 U/L (ref 0–35)
AST: 12 U/L (ref 0–37)
Albumin: 4.3 g/dL (ref 3.5–5.2)
Alkaline Phosphatase: 28 U/L — ABNORMAL LOW (ref 39–117)
Bilirubin, Direct: 0.1 mg/dL (ref 0.0–0.3)
Total Bilirubin: 1 mg/dL (ref 0.2–1.2)
Total Protein: 6.7 g/dL (ref 6.0–8.3)

## 2021-02-13 LAB — URINALYSIS, ROUTINE W REFLEX MICROSCOPIC
Bilirubin Urine: NEGATIVE
Hgb urine dipstick: NEGATIVE
Ketones, ur: NEGATIVE
Leukocytes,Ua: NEGATIVE
Nitrite: NEGATIVE
Specific Gravity, Urine: 1.02 (ref 1.000–1.030)
Total Protein, Urine: NEGATIVE
Urine Glucose: NEGATIVE
Urobilinogen, UA: 0.2 (ref 0.0–1.0)
pH: 5.5 (ref 5.0–8.0)

## 2021-02-13 LAB — LIPID PANEL
Cholesterol: 188 mg/dL (ref 0–200)
HDL: 63.7 mg/dL (ref >39.00)
LDL Cholesterol: 108 mg/dL — ABNORMAL HIGH (ref 0–99)
NonHDL: 124.57
Total CHOL/HDL Ratio: 3
Triglycerides: 81 mg/dL (ref 0.0–149.0)
VLDL: 16.2 mg/dL (ref 0.0–40.0)

## 2021-02-13 LAB — BASIC METABOLIC PANEL
BUN: 16 mg/dL (ref 6–23)
CO2: 32 mEq/L (ref 19–32)
Calcium: 9.9 mg/dL (ref 8.4–10.5)
Chloride: 103 mEq/L (ref 96–112)
Creatinine, Ser: 0.77 mg/dL (ref 0.40–1.20)
GFR: 79.29 mL/min (ref >60.00)
Glucose, Bld: 102 mg/dL — ABNORMAL HIGH (ref 70–99)
Potassium: 3.8 mEq/L (ref 3.5–5.1)
Sodium: 141 mEq/L (ref 135–145)

## 2021-02-13 LAB — HEMOGLOBIN A1C: Hgb A1c MFr Bld: 5.9 % (ref 4.6–6.5)

## 2021-02-15 ENCOUNTER — Telehealth (INDEPENDENT_AMBULATORY_CARE_PROVIDER_SITE_OTHER): Payer: PPO | Admitting: Internal Medicine

## 2021-02-15 ENCOUNTER — Other Ambulatory Visit: Payer: Self-pay | Admitting: Internal Medicine

## 2021-02-15 DIAGNOSIS — R739 Hyperglycemia, unspecified: Secondary | ICD-10-CM | POA: Diagnosis not present

## 2021-02-15 DIAGNOSIS — F439 Reaction to severe stress, unspecified: Secondary | ICD-10-CM

## 2021-02-15 DIAGNOSIS — M810 Age-related osteoporosis without current pathological fracture: Secondary | ICD-10-CM

## 2021-02-15 NOTE — Progress Notes (Signed)
Virtual Visit via telephone Note  This visit type was conducted due to national recommendations for restrictions regarding the COVID-19 pandemic (e.g. social distancing).  This format is felt to be most appropriate for this patient at this time.  All issues noted in this document were discussed and addressed.  No physical exam was performed (except for noted visual exam findings with Video Visits).   I connected with [name] by telephone and verified that I am speaking with the correct person using two identifiers. Location patient: home Location provider: work Persons participating in the telephone visit: patient, provider  The limitations, risks, security and privacy concerns of performing an evaluation and management service by telephone and the availability of in person appointment have been discussed.  It has also been discussed with the patient that there may be a patient responsible charge related to this service. The patient expressed understanding and agreed to proceed.   Reason for visit: scheduled follow up  HPI: Was recently diagnosed with breast cancer.  Is s/p surgery.  Does not need chemo.  Planning for radiation therapy.  Saw oncology 02/06/21. Discussed aromatase inhibitors.  She is on oral fosamax.  Here to discuss other treatment options.  She reports she is doing relatively well.  Handling stress.  Tries to stay active.  No chest pain or sob reported.  No abdominal pain or bowel change reported.  Discussed osteoporosis and treatment options.  Discussed reclast and prolia.     ROS: See pertinent positives and negatives per HPI.  Past Medical History:  Diagnosis Date  . Hypercholesterolemia   . Seasonal allergies     Past Surgical History:  Procedure Laterality Date  . ABDOMINAL HYSTERECTOMY    . BREAST EXCISIONAL BIOPSY Right 2003   neg  . BREAST LUMPECTOMY Right    benign  . BREAST LUMPECTOMY WITH SENTINEL LYMPH NODE BIOPSY Right 01/13/2021   Procedure:  BREAST LUMPECTOMY WITH SENTINEL LYMPH NODE BX;  Surgeon: Robert Bellow, MD;  Location: [facility] ORS;  Service: General;  Laterality: Right;  . COLONOSCOPY WITH PROPOFOL N/A 06/25/2018   Procedure: COLONOSCOPY WITH PROPOFOL;  Surgeon: Manya Silvas, MD;  Location: [facility] ENDOSCOPY;  Service: Endoscopy;  Laterality: N/A;  . PARTIAL HYSTERECTOMY  1988   secondary to fibroids and heavy bleeding and endometriosis  . TONSILLECTOMY     age 63    Family History  Problem Relation Age of Onset  . Hypercholesterolemia Mother   . Hypothyroidism Sister   . Hypercholesterolemia Sister   . Breast cancer Maternal Aunt 80  . Colon cancer Neg Hx     SOCIAL HX: reviewed.    Current Outpatient Medications:  .  alendronate (FOSAMAX) 70 MG tablet, Take 1 tablet (70 mg total) by mouth once a week. Take with a full glass of water on an empty stomach. (Patient taking differently: Take 70 mg by mouth every Friday. Take with a full glass of water on an empty stomach.), Disp: 4 tablet, Rfl: 11 .  aspirin EC 81 MG tablet, Take 81 mg by mouth daily., Disp: , Rfl:  .  Calcium Citrate-Vitamin D (CALCIUM CITRATE + D PO), Take 2 tablets by mouth 2 (two) times daily., Disp: , Rfl:  .  cetirizine (ZYRTEC) 10 MG tablet, Take 10 mg by mouth daily., Disp: , Rfl:  .  clindamycin-benzoyl peroxide (BENZACLIN) gel, Apply 1 application topically 2 (two) times daily as needed (acne)., Disp: , Rfl:  .  Cranberry 1000 MG CAPS, Take 1  tablet by mouth every other day., Disp: , Rfl:  .  diphenhydrAMINE (BENADRYL) 25 MG tablet, Take 25 mg by mouth at bedtime as needed for allergies., Disp: , Rfl:  .  doxycycline (VIBRAMYCIN) 100 MG capsule, Take 100 mg by mouth daily as needed (acne)., Disp: , Rfl:  .  estradiol (ESTRACE VAGINAL) 0.1 MG/GM vaginal cream, One applicator q hs x 5 nights and then 2x/week., Disp: 42.5 g, Rfl: 2 .  Fiber POWD, Take 1 Dose by mouth daily. 1 dose = 1 tablespoon, Disp: , Rfl:  .  Glucosamine-Chondroitin  (OSTEO BI-FLEX REGULAR STRENGTH PO), Take 1 tablet by mouth daily., Disp: , Rfl:  .  Multiple Vitamins-Minerals (CENTRUM SILVER ADULT 50+) TABS, Take 1 tablet by mouth daily., Disp: , Rfl:  .  Omega-3 Fatty Acids (FISH OIL) 1000 MG CAPS, Take 1,000 mg by mouth daily., Disp: , Rfl:  .  Probiotic Product (PROBIOTIC-10 PO), Take 1 capsule by mouth daily., Disp: , Rfl:  .  rosuvastatin (CRESTOR) 10 MG tablet, TAKE 1 TABLET BY MOUTH EVERY DAY, Disp: 90 tablet, Rfl: 1 .  spironolactone (ALDACTONE) 50 MG tablet, Take 50 mg by mouth daily., Disp: , Rfl:  .  traMADol (ULTRAM) 50 MG tablet, Take 1 tablet (50 mg total) by mouth every 6 (six) hours as needed., Disp: 10 tablet, Rfl: 0 .  tretinoin (RETIN-A) 0.05 % cream, Apply topically at bedtime as needed., Disp: , Rfl:  .  TURMERIC PO, Take 1 tablet by mouth every other day., Disp: , Rfl:   EXAM:  GENERAL: alert. Sounds to be in no acute distress.  Answering questions appropriately.  PSYCH/NEURO: pleasant and cooperative, no obvious depression or anxiety, speech and thought processing grossly intact  ASSESSMENT AND PLAN:  Discussed the following assessment and plan:  Problem List Items Addressed This Visit    Hyperglycemia    Follow met b and a1c.       Osteoporosis    Discussed osteoporosis and treatment options.  She is on oral fosamax.  Discussed aromatase inhibitors and possible side effect - given her history of osteoporosis.  Discussed changing from oral fosamax to reclast or prolia.  Discussed possible side effects.  She prefers to get through her XRT first.  Will notify me when agreeable for change of treatment.  Wants to think about it.  Follow.  Continue calcium, vitamin D and weight bearing exercise.       Stress    Increased stress.  Discussed.  She is doing well s/p surgery.  Planning XRT.  Has good support.  Follow.           I discussed the assessment and treatment plan with the patient. The patient was provided an  opportunity to ask questions and all were answered. The patient agreed with the plan and demonstrated an understanding of the instructions.   The patient was advised to call back or seek an in-person evaluation if the symptoms worsen or if the condition fails to improve as anticipated.  I provided 25 minutes of non-face-to-face time during this encounter.   Einar Pheasant, MD

## 2021-02-16 ENCOUNTER — Ambulatory Visit: Admission: RE | Admit: 2021-02-16 | Payer: PPO | Source: Ambulatory Visit

## 2021-02-16 DIAGNOSIS — Z510 Encounter for antineoplastic radiation therapy: Secondary | ICD-10-CM | POA: Diagnosis not present

## 2021-02-16 DIAGNOSIS — Z170 Estrogen receptor positive status [ER+]: Secondary | ICD-10-CM | POA: Diagnosis not present

## 2021-02-16 DIAGNOSIS — C50411 Malignant neoplasm of upper-outer quadrant of right female breast: Secondary | ICD-10-CM | POA: Diagnosis not present

## 2021-02-19 ENCOUNTER — Encounter: Payer: Self-pay | Admitting: Internal Medicine

## 2021-02-19 NOTE — Assessment & Plan Note (Signed)
Discussed osteoporosis and treatment options.  She is on oral fosamax.  Discussed aromatase inhibitors and possible side effect - given her history of osteoporosis.  Discussed changing from oral fosamax to reclast or prolia.  Discussed possible side effects.  She prefers to get through her XRT first.  Will notify me when agreeable for change of treatment.  Wants to think about it.  Follow.  Continue calcium, vitamin D and weight bearing exercise.

## 2021-02-19 NOTE — Assessment & Plan Note (Signed)
Follow met b and a1c.  °

## 2021-02-19 NOTE — Assessment & Plan Note (Signed)
Increased stress.  Discussed.  She is doing well s/p surgery.  Planning XRT.  Has good support.  Follow.

## 2021-02-20 ENCOUNTER — Ambulatory Visit
Admission: RE | Admit: 2021-02-20 | Discharge: 2021-02-20 | Disposition: A | Discharge status: Home / Self Care | Payer: PPO | Source: Ambulatory Visit | Attending: Radiation Oncology | Admitting: Radiation Oncology

## 2021-02-20 DIAGNOSIS — Z510 Encounter for antineoplastic radiation therapy: Secondary | ICD-10-CM | POA: Diagnosis not present

## 2021-02-20 DIAGNOSIS — C50411 Malignant neoplasm of upper-outer quadrant of right female breast: Secondary | ICD-10-CM | POA: Diagnosis not present

## 2021-02-20 DIAGNOSIS — Z170 Estrogen receptor positive status [ER+]: Secondary | ICD-10-CM | POA: Diagnosis not present

## 2021-02-21 ENCOUNTER — Ambulatory Visit
Admission: RE | Admit: 2021-02-21 | Discharge: 2021-02-21 | Disposition: A | Discharge status: Home / Self Care | Payer: PPO | Source: Ambulatory Visit | Attending: Radiation Oncology | Admitting: Radiation Oncology

## 2021-02-21 DIAGNOSIS — Z170 Estrogen receptor positive status [ER+]: Secondary | ICD-10-CM | POA: Diagnosis not present

## 2021-02-21 DIAGNOSIS — Z510 Encounter for antineoplastic radiation therapy: Secondary | ICD-10-CM | POA: Diagnosis not present

## 2021-02-21 DIAGNOSIS — C50411 Malignant neoplasm of upper-outer quadrant of right female breast: Secondary | ICD-10-CM | POA: Diagnosis not present

## 2021-02-22 ENCOUNTER — Ambulatory Visit
Admission: RE | Admit: 2021-02-22 | Discharge: 2021-02-22 | Disposition: A | Discharge status: Home / Self Care | Payer: PPO | Source: Ambulatory Visit | Attending: Radiation Oncology | Admitting: Radiation Oncology

## 2021-02-22 DIAGNOSIS — Z170 Estrogen receptor positive status [ER+]: Secondary | ICD-10-CM | POA: Diagnosis not present

## 2021-02-22 DIAGNOSIS — Z510 Encounter for antineoplastic radiation therapy: Secondary | ICD-10-CM | POA: Diagnosis not present

## 2021-02-22 DIAGNOSIS — C50411 Malignant neoplasm of upper-outer quadrant of right female breast: Secondary | ICD-10-CM | POA: Diagnosis not present

## 2021-02-23 ENCOUNTER — Ambulatory Visit
Admission: RE | Admit: 2021-02-23 | Discharge: 2021-02-23 | Disposition: A | Discharge status: Home / Self Care | Payer: PPO | Source: Ambulatory Visit | Attending: Radiation Oncology | Admitting: Radiation Oncology

## 2021-02-23 DIAGNOSIS — Z170 Estrogen receptor positive status [ER+]: Secondary | ICD-10-CM | POA: Diagnosis not present

## 2021-02-23 DIAGNOSIS — Z510 Encounter for antineoplastic radiation therapy: Secondary | ICD-10-CM | POA: Diagnosis not present

## 2021-02-23 DIAGNOSIS — C50411 Malignant neoplasm of upper-outer quadrant of right female breast: Secondary | ICD-10-CM | POA: Diagnosis not present

## 2021-02-24 ENCOUNTER — Ambulatory Visit
Admission: RE | Admit: 2021-02-24 | Discharge: 2021-02-24 | Disposition: A | Discharge status: Home / Self Care | Payer: PPO | Source: Ambulatory Visit | Attending: Radiation Oncology | Admitting: Radiation Oncology

## 2021-02-24 DIAGNOSIS — Z170 Estrogen receptor positive status [ER+]: Secondary | ICD-10-CM | POA: Diagnosis not present

## 2021-02-24 DIAGNOSIS — Z510 Encounter for antineoplastic radiation therapy: Secondary | ICD-10-CM | POA: Diagnosis not present

## 2021-02-24 DIAGNOSIS — C50411 Malignant neoplasm of upper-outer quadrant of right female breast: Secondary | ICD-10-CM | POA: Diagnosis not present

## 2021-02-27 ENCOUNTER — Ambulatory Visit
Admission: RE | Admit: 2021-02-27 | Discharge: 2021-02-27 | Disposition: A | Discharge status: Home / Self Care | Payer: PPO | Source: Ambulatory Visit | Attending: Radiation Oncology | Admitting: Radiation Oncology

## 2021-02-27 DIAGNOSIS — Z170 Estrogen receptor positive status [ER+]: Secondary | ICD-10-CM | POA: Diagnosis not present

## 2021-02-27 DIAGNOSIS — C50411 Malignant neoplasm of upper-outer quadrant of right female breast: Secondary | ICD-10-CM | POA: Diagnosis not present

## 2021-02-27 DIAGNOSIS — Z510 Encounter for antineoplastic radiation therapy: Secondary | ICD-10-CM | POA: Diagnosis not present

## 2021-02-28 ENCOUNTER — Ambulatory Visit
Admission: RE | Admit: 2021-02-28 | Discharge: 2021-02-28 | Disposition: A | Discharge status: Home / Self Care | Payer: PPO | Source: Ambulatory Visit | Attending: Radiation Oncology | Admitting: Radiation Oncology

## 2021-02-28 DIAGNOSIS — Z170 Estrogen receptor positive status [ER+]: Secondary | ICD-10-CM | POA: Diagnosis not present

## 2021-02-28 DIAGNOSIS — C50411 Malignant neoplasm of upper-outer quadrant of right female breast: Secondary | ICD-10-CM | POA: Diagnosis not present

## 2021-02-28 DIAGNOSIS — Z510 Encounter for antineoplastic radiation therapy: Secondary | ICD-10-CM | POA: Diagnosis not present

## 2021-03-01 ENCOUNTER — Ambulatory Visit
Admission: RE | Admit: 2021-03-01 | Discharge: 2021-03-01 | Disposition: A | Discharge status: Home / Self Care | Payer: PPO | Source: Ambulatory Visit | Attending: Radiation Oncology | Admitting: Radiation Oncology

## 2021-03-01 DIAGNOSIS — Z170 Estrogen receptor positive status [ER+]: Secondary | ICD-10-CM | POA: Diagnosis not present

## 2021-03-01 DIAGNOSIS — C50411 Malignant neoplasm of upper-outer quadrant of right female breast: Secondary | ICD-10-CM | POA: Diagnosis not present

## 2021-03-01 DIAGNOSIS — Z510 Encounter for antineoplastic radiation therapy: Secondary | ICD-10-CM | POA: Diagnosis not present

## 2021-03-02 ENCOUNTER — Ambulatory Visit
Admission: RE | Admit: 2021-03-02 | Discharge: 2021-03-02 | Disposition: A | Discharge status: Home / Self Care | Payer: PPO | Source: Ambulatory Visit | Attending: Radiation Oncology | Admitting: Radiation Oncology

## 2021-03-02 DIAGNOSIS — Z170 Estrogen receptor positive status [ER+]: Secondary | ICD-10-CM | POA: Diagnosis not present

## 2021-03-02 DIAGNOSIS — C50411 Malignant neoplasm of upper-outer quadrant of right female breast: Secondary | ICD-10-CM | POA: Diagnosis not present

## 2021-03-02 DIAGNOSIS — Z510 Encounter for antineoplastic radiation therapy: Secondary | ICD-10-CM | POA: Diagnosis not present

## 2021-03-03 ENCOUNTER — Ambulatory Visit
Admission: RE | Admit: 2021-03-03 | Discharge: 2021-03-03 | Disposition: A | Discharge status: Home / Self Care | Payer: PPO | Source: Ambulatory Visit | Attending: Radiation Oncology | Admitting: Radiation Oncology

## 2021-03-03 DIAGNOSIS — Z510 Encounter for antineoplastic radiation therapy: Secondary | ICD-10-CM | POA: Diagnosis not present

## 2021-03-03 DIAGNOSIS — C50411 Malignant neoplasm of upper-outer quadrant of right female breast: Secondary | ICD-10-CM | POA: Diagnosis not present

## 2021-03-03 DIAGNOSIS — Z170 Estrogen receptor positive status [ER+]: Secondary | ICD-10-CM | POA: Insufficient documentation

## 2021-03-06 ENCOUNTER — Inpatient Hospital Stay: Payer: PPO | Attending: Radiation Oncology

## 2021-03-06 ENCOUNTER — Ambulatory Visit
Admission: RE | Admit: 2021-03-06 | Discharge: 2021-03-06 | Disposition: A | Discharge status: Home / Self Care | Payer: PPO | Source: Ambulatory Visit | Attending: Radiation Oncology | Admitting: Radiation Oncology

## 2021-03-06 ENCOUNTER — Other Ambulatory Visit: Payer: Self-pay

## 2021-03-06 DIAGNOSIS — Z8349 Family history of other endocrine, nutritional and metabolic diseases: Secondary | ICD-10-CM | POA: Diagnosis not present

## 2021-03-06 DIAGNOSIS — Z90710 Acquired absence of both cervix and uterus: Secondary | ICD-10-CM | POA: Diagnosis not present

## 2021-03-06 DIAGNOSIS — Z803 Family history of malignant neoplasm of breast: Secondary | ICD-10-CM | POA: Diagnosis not present

## 2021-03-06 DIAGNOSIS — Z79899 Other long term (current) drug therapy: Secondary | ICD-10-CM | POA: Diagnosis not present

## 2021-03-06 DIAGNOSIS — Z7982 Long term (current) use of aspirin: Secondary | ICD-10-CM | POA: Diagnosis not present

## 2021-03-06 DIAGNOSIS — C50411 Malignant neoplasm of upper-outer quadrant of right female breast: Secondary | ICD-10-CM | POA: Insufficient documentation

## 2021-03-06 DIAGNOSIS — Z510 Encounter for antineoplastic radiation therapy: Secondary | ICD-10-CM | POA: Diagnosis not present

## 2021-03-06 DIAGNOSIS — E7800 Pure hypercholesterolemia, unspecified: Secondary | ICD-10-CM | POA: Diagnosis not present

## 2021-03-06 DIAGNOSIS — Z170 Estrogen receptor positive status [ER+]: Secondary | ICD-10-CM | POA: Diagnosis not present

## 2021-03-06 LAB — CBC
HCT: 45.9 % (ref 36.0–46.0)
Hemoglobin: 15.5 g/dL — ABNORMAL HIGH (ref 12.0–15.0)
MCH: 32.2 pg (ref 26.0–34.0)
MCHC: 33.8 g/dL (ref 30.0–36.0)
MCV: 95.2 fL (ref 80.0–100.0)
Platelets: 229 10*3/uL (ref 150–400)
RBC: 4.82 MIL/uL (ref 3.87–5.11)
RDW: 11.9 % (ref 11.5–15.5)
WBC: 5.8 10*3/uL (ref 4.0–10.5)
nRBC: 0 % (ref 0.0–0.2)

## 2021-03-07 ENCOUNTER — Ambulatory Visit
Admission: RE | Admit: 2021-03-07 | Discharge: 2021-03-07 | Disposition: A | Discharge status: Home / Self Care | Payer: PPO | Source: Ambulatory Visit | Attending: Radiation Oncology | Admitting: Radiation Oncology

## 2021-03-07 DIAGNOSIS — C50411 Malignant neoplasm of upper-outer quadrant of right female breast: Secondary | ICD-10-CM | POA: Diagnosis not present

## 2021-03-07 DIAGNOSIS — Z170 Estrogen receptor positive status [ER+]: Secondary | ICD-10-CM | POA: Diagnosis not present

## 2021-03-07 DIAGNOSIS — Z510 Encounter for antineoplastic radiation therapy: Secondary | ICD-10-CM | POA: Diagnosis not present

## 2021-03-08 ENCOUNTER — Ambulatory Visit
Admission: RE | Admit: 2021-03-08 | Discharge: 2021-03-08 | Disposition: A | Discharge status: Home / Self Care | Payer: PPO | Source: Ambulatory Visit | Attending: Radiation Oncology | Admitting: Radiation Oncology

## 2021-03-08 DIAGNOSIS — Z510 Encounter for antineoplastic radiation therapy: Secondary | ICD-10-CM | POA: Diagnosis not present

## 2021-03-08 DIAGNOSIS — Z170 Estrogen receptor positive status [ER+]: Secondary | ICD-10-CM | POA: Diagnosis not present

## 2021-03-08 DIAGNOSIS — C50411 Malignant neoplasm of upper-outer quadrant of right female breast: Secondary | ICD-10-CM | POA: Diagnosis not present

## 2021-03-09 ENCOUNTER — Ambulatory Visit
Admission: RE | Admit: 2021-03-09 | Discharge: 2021-03-09 | Disposition: A | Discharge status: Home / Self Care | Payer: PPO | Source: Ambulatory Visit | Attending: Radiation Oncology | Admitting: Radiation Oncology

## 2021-03-09 DIAGNOSIS — C50411 Malignant neoplasm of upper-outer quadrant of right female breast: Secondary | ICD-10-CM | POA: Diagnosis not present

## 2021-03-09 DIAGNOSIS — Z510 Encounter for antineoplastic radiation therapy: Secondary | ICD-10-CM | POA: Diagnosis not present

## 2021-03-09 DIAGNOSIS — Z170 Estrogen receptor positive status [ER+]: Secondary | ICD-10-CM | POA: Diagnosis not present

## 2021-03-10 ENCOUNTER — Ambulatory Visit
Admission: RE | Admit: 2021-03-10 | Discharge: 2021-03-10 | Disposition: A | Discharge status: Home / Self Care | Payer: PPO | Source: Ambulatory Visit | Attending: Radiation Oncology | Admitting: Radiation Oncology

## 2021-03-10 DIAGNOSIS — C50411 Malignant neoplasm of upper-outer quadrant of right female breast: Secondary | ICD-10-CM | POA: Diagnosis not present

## 2021-03-10 DIAGNOSIS — Z510 Encounter for antineoplastic radiation therapy: Secondary | ICD-10-CM | POA: Diagnosis not present

## 2021-03-10 DIAGNOSIS — Z170 Estrogen receptor positive status [ER+]: Secondary | ICD-10-CM | POA: Diagnosis not present

## 2021-03-13 ENCOUNTER — Ambulatory Visit
Admission: RE | Admit: 2021-03-13 | Discharge: 2021-03-13 | Disposition: A | Discharge status: Home / Self Care | Payer: PPO | Source: Ambulatory Visit | Attending: Radiation Oncology | Admitting: Radiation Oncology

## 2021-03-13 DIAGNOSIS — Z510 Encounter for antineoplastic radiation therapy: Secondary | ICD-10-CM | POA: Diagnosis not present

## 2021-03-13 DIAGNOSIS — Z170 Estrogen receptor positive status [ER+]: Secondary | ICD-10-CM | POA: Diagnosis not present

## 2021-03-13 DIAGNOSIS — C50411 Malignant neoplasm of upper-outer quadrant of right female breast: Secondary | ICD-10-CM | POA: Diagnosis not present

## 2021-03-14 ENCOUNTER — Ambulatory Visit
Admission: RE | Admit: 2021-03-14 | Discharge: 2021-03-14 | Disposition: A | Discharge status: Home / Self Care | Payer: PPO | Source: Ambulatory Visit | Attending: Radiation Oncology | Admitting: Radiation Oncology

## 2021-03-14 DIAGNOSIS — Z510 Encounter for antineoplastic radiation therapy: Secondary | ICD-10-CM | POA: Diagnosis not present

## 2021-03-15 ENCOUNTER — Ambulatory Visit
Admission: RE | Admit: 2021-03-15 | Discharge: 2021-03-15 | Disposition: A | Discharge status: Home / Self Care | Payer: PPO | Source: Ambulatory Visit | Attending: Radiation Oncology | Admitting: Radiation Oncology

## 2021-03-15 DIAGNOSIS — Z510 Encounter for antineoplastic radiation therapy: Secondary | ICD-10-CM | POA: Diagnosis not present

## 2021-03-16 ENCOUNTER — Ambulatory Visit
Admission: RE | Admit: 2021-03-16 | Discharge: 2021-03-16 | Disposition: A | Discharge status: Home / Self Care | Payer: PPO | Source: Ambulatory Visit | Attending: Radiation Oncology | Admitting: Radiation Oncology

## 2021-03-16 DIAGNOSIS — Z510 Encounter for antineoplastic radiation therapy: Secondary | ICD-10-CM | POA: Diagnosis not present

## 2021-03-17 ENCOUNTER — Ambulatory Visit
Admission: RE | Admit: 2021-03-17 | Discharge: 2021-03-17 | Disposition: A | Discharge status: Home / Self Care | Payer: PPO | Source: Ambulatory Visit | Attending: Radiation Oncology | Admitting: Radiation Oncology

## 2021-03-17 DIAGNOSIS — Z510 Encounter for antineoplastic radiation therapy: Secondary | ICD-10-CM | POA: Diagnosis not present

## 2021-03-17 DIAGNOSIS — Z170 Estrogen receptor positive status [ER+]: Secondary | ICD-10-CM | POA: Diagnosis not present

## 2021-03-17 DIAGNOSIS — C50411 Malignant neoplasm of upper-outer quadrant of right female breast: Secondary | ICD-10-CM | POA: Diagnosis not present

## 2021-03-20 ENCOUNTER — Inpatient Hospital Stay (HOSPITAL_BASED_OUTPATIENT_CLINIC_OR_DEPARTMENT_OTHER): Payer: PPO | Admitting: Internal Medicine

## 2021-03-20 ENCOUNTER — Telehealth: Payer: Self-pay | Admitting: Internal Medicine

## 2021-03-20 ENCOUNTER — Ambulatory Visit
Admission: RE | Admit: 2021-03-20 | Discharge: 2021-03-20 | Disposition: A | Discharge status: Home / Self Care | Payer: PPO | Source: Ambulatory Visit | Attending: Radiation Oncology | Admitting: Radiation Oncology

## 2021-03-20 ENCOUNTER — Other Ambulatory Visit: Payer: Self-pay

## 2021-03-20 DIAGNOSIS — M810 Age-related osteoporosis without current pathological fracture: Secondary | ICD-10-CM

## 2021-03-20 DIAGNOSIS — C50411 Malignant neoplasm of upper-outer quadrant of right female breast: Secondary | ICD-10-CM | POA: Diagnosis not present

## 2021-03-20 DIAGNOSIS — E7800 Pure hypercholesterolemia, unspecified: Secondary | ICD-10-CM

## 2021-03-20 DIAGNOSIS — Z170 Estrogen receptor positive status [ER+]: Secondary | ICD-10-CM | POA: Diagnosis not present

## 2021-03-20 DIAGNOSIS — R739 Hyperglycemia, unspecified: Secondary | ICD-10-CM

## 2021-03-20 DIAGNOSIS — Z510 Encounter for antineoplastic radiation therapy: Secondary | ICD-10-CM | POA: Diagnosis not present

## 2021-03-20 MED ORDER — ANASTROZOLE 1 MG PO TABS: 1.0000 mg | ORAL_TABLET | Freq: Every day | ORAL | 4 refills | Status: DC

## 2021-03-20 NOTE — Assessment & Plan Note (Signed)
#   Right breast cancer Stage IA ER/PR positive; her 2 NEG' Oncotype- low risk.  S/p RT [4/18 today].  Recommend adjuvant endocrine therapy.  Discussed the pros and cons of tamoxifen versus anastrozole-patient comfortable starting with anastrozole.  New prescription started.  Again reviewed the potential side effects including but not limited to joint pains/hot flashes/osteoporosis.  # OSTEOPOROSIS-T score= - 4.0 on Fosomax [ JAN 7445].  Again reviewed the potential side effects of bisphosphonate-including but not limited to joint pain arthralgias; hypocalcemia; osteonecrosis of the jaw.  Patient is interested in Reclast however wants to wait scheduling at this time.  She will let us know at next visit.   # Genetics: Discussed generics counseling.   # DISPOSITION: # Follow up in 2 months- MD; No labs- Dr.B

## 2021-03-20 NOTE — Telephone Encounter (Signed)
PT called and would like a lab done before upcoming physical in Oct

## 2021-03-20 NOTE — Progress Notes (Signed)
Here for follow-up. Wants to postpone biphosphonate infusion at this time. States she is already on something oral for osteoporosis and she has discussed this with her pcp.

## 2021-03-20 NOTE — Progress Notes (Signed)
[facility] NOTE  Patient Care Team: Einar Pheasant, MD as PCP - General (Internal Medicine) Einar Pheasant, MD (Internal Medicine)  CHIEF COMPLAINTS/PURPOSE OF CONSULTATION: Breast cancer  #  Oncology History Overview Note  # RIGHT BREAST INVASIVE MAMMARY CARCINOMA- STAGE IA- pT1c sn pN0; ER- > 90%/PR 51-90%; Her-2 NEG [s/p Lumpec; SLNBx; Dr.Byrnett]; Oncotype DX score: 8, less than 1% chance of benefit from adjuvant chemotherapy. [s/p TR- April, 18th, 2022]  # April mid START Anastrazole   # OSTEOPOROSIS:BMD- T score= -4.1 [Fosomax- Jan 2022]     # SURVIVORSHIP:   # GENETICS:   DIAGNOSIS: Right breast cancer  STAGE:   I      ;  GOALS: cure  CURRENT/MOST RECENT THERAPY : RT/AI    Carcinoma of upper-outer quadrant of right breast in female, estrogen receptor positive ([place])  02/06/2021 Initial Diagnosis   Carcinoma of upper-outer quadrant of right breast in female, estrogen receptor positive ([place])   02/06/2021 Cancer Staging   Staging form: Breast, AJCC 8th Edition - Pathologic: Stage IA (pT1c, pN0, cM0, G1, ER+, PR+, HER2-) - Signed by Cammie Sickle, MD on 02/06/2021 Stage prefix: Initial diagnosis Histologic grading system: 3 grade system Menopausal status: Postmenopausal      HISTORY OF PRESENTING ILLNESS:  Tracy Frey 70 y.o.  female stage I ER/PR positive HER2 negative breast cancer currently s/p adjuvant radiation.  Patient finishing radiation today.  Patient tolerated radiation fairly well.  Denies any significant rash.  Denies any significant side effects.  Otherwise no nausea vomiting.  No headaches.  Review of Systems  Constitutional: Negative for chills, diaphoresis, fever, malaise/fatigue and weight loss.  HENT: Negative for nosebleeds and sore throat.   Eyes: Negative for double vision.  Respiratory: Negative for cough, hemoptysis, sputum production, shortness of breath and wheezing.   Cardiovascular: Negative for chest  pain, palpitations, orthopnea and leg swelling.  Gastrointestinal: Negative for abdominal pain, blood in stool, constipation, diarrhea, heartburn, melena, nausea and vomiting.  Genitourinary: Negative for dysuria, frequency and urgency.  Musculoskeletal: Negative for back pain and joint pain.  Skin: Negative.  Negative for itching and rash.  Neurological: Negative for dizziness, tingling, focal weakness, weakness and headaches.  Endo/Heme/Allergies: Does not bruise/bleed easily.  Psychiatric/Behavioral: Negative for depression. The patient is not nervous/anxious and does not have insomnia.      MEDICAL HISTORY:  Past Medical History:  Diagnosis Date  . Hypercholesterolemia   . Seasonal allergies     SURGICAL HISTORY: Past Surgical History:  Procedure Laterality Date  . ABDOMINAL HYSTERECTOMY    . BREAST EXCISIONAL BIOPSY Right 2003   neg  . BREAST LUMPECTOMY Right    benign  . BREAST LUMPECTOMY WITH SENTINEL LYMPH NODE BIOPSY Right 01/13/2021   Procedure: BREAST LUMPECTOMY WITH SENTINEL LYMPH NODE BX;  Surgeon: Robert Bellow, MD;  Location: [facility] ORS;  Service: General;  Laterality: Right;  . COLONOSCOPY WITH PROPOFOL N/A 06/25/2018   Procedure: COLONOSCOPY WITH PROPOFOL;  Surgeon: Manya Silvas, MD;  Location: [facility] ENDOSCOPY;  Service: Endoscopy;  Laterality: N/A;  . PARTIAL HYSTERECTOMY  1988   secondary to fibroids and heavy bleeding and endometriosis  . TONSILLECTOMY     age 18    SOCIAL HISTORY: Social History   Socioeconomic History  . Marital status: Married    Spouse name: Not on file  . Number of children: 2  . Years of education: Not on file  . Highest education level: Not on file  Occupational History  Employer: [employer]  Tobacco Use  . Smoking status: Never Smoker  . Smokeless tobacco: Never Used  Vaping Use  . Vaping Use: Never used  Substance and Sexual Activity  . Alcohol use: Yes    Alcohol/week: 0.0 standard drinks    Comment: ocassionally   . Drug use: No  . Sexual activity: Not on file  Other Topics Concern  . Not on file  Social History Narrative   Lives in [place] with husband; 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    Social Determinants of Health   Financial Resource Strain: Not on file  Food Insecurity: Not on file  Transportation Needs: Not on file  Physical Activity: Not on file  Stress: Not on file  Social Connections: Not on file  Intimate Partner Violence: Not on file    FAMILY HISTORY: Family History  Problem Relation Age of Onset  . Hypercholesterolemia Mother   . Hypothyroidism Sister   . Hypercholesterolemia Sister   . Breast cancer Maternal Aunt 80  . Colon cancer Neg Hx     ALLERGIES:  is allergic to chocolate and sulfa antibiotics.  MEDICATIONS:  Current Outpatient Medications  Medication Sig Dispense Refill  . alendronate (FOSAMAX) 70 MG tablet Take 1 tablet (70 mg total) by mouth once a week. Take with a full glass of water on an empty stomach. (Patient taking differently: Take 70 mg by mouth every Friday. Take with a full glass of water on an empty stomach.) 4 tablet 11  . anastrozole (ARIMIDEX) 1 MG tablet Take 1 tablet (1 mg total) by mouth daily. 30 tablet 4  . aspirin EC 81 MG tablet Take 81 mg by mouth daily.    . Calcium Citrate-Vitamin D (CALCIUM CITRATE + D PO) Take 2 tablets by mouth 2 (two) times daily.    . cetirizine (ZYRTEC) 10 MG tablet Take 10 mg by mouth daily.    . clindamycin-benzoyl peroxide (BENZACLIN) gel Apply 1 application topically 2 (two) times daily as needed (acne).    . Cranberry 1000 MG CAPS Take 1 tablet by mouth every other day.    . diphenhydrAMINE (BENADRYL) 25 MG tablet Take 25 mg by mouth at bedtime as needed for allergies.    Marland Kitchen doxycycline (VIBRAMYCIN) 100 MG capsule Take 100 mg by mouth daily as needed (acne).    . Fiber POWD Take 1 Dose by mouth daily. 1 dose = 1 tablespoon    . Glucosamine-Chondroitin (OSTEO BI-FLEX REGULAR  STRENGTH PO) Take 1 tablet by mouth daily.    . Multiple Vitamins-Minerals (CENTRUM SILVER ADULT 50+) TABS Take 1 tablet by mouth daily.    . Omega-3 Fatty Acids (FISH OIL) 1000 MG CAPS Take 1,000 mg by mouth daily.    . Probiotic Product (PROBIOTIC-10 PO) Take 1 capsule by mouth daily.    . rosuvastatin (CRESTOR) 10 MG tablet TAKE 1 TABLET BY MOUTH EVERY DAY 90 tablet 1  . spironolactone (ALDACTONE) 50 MG tablet Take 50 mg by mouth daily.    Marland Kitchen tretinoin (RETIN-A) 0.05 % cream Apply topically at bedtime as needed.    Marland Kitchen estradiol (ESTRACE VAGINAL) 0.1 MG/GM vaginal cream One applicator q hs x 5 nights and then 2x/week. (Patient not taking: Reported on 03/20/2021) 42.5 g 2  . TURMERIC PO Take 1 tablet by mouth every other day. (Patient not taking: Reported on 03/20/2021)     No current facility-administered medications for this visit.      Marland Kitchen  PHYSICAL EXAMINATION: ECOG PERFORMANCE STATUS:  0 - Asymptomatic  Vitals:   03/20/21 1035  BP: 114/69  Pulse: 84  Resp: 16  Temp: (!) 96.9 F (36.1 C)  SpO2: 100%   Filed Weights   03/20/21 1035  Weight: 112 lb 6 oz (51 kg)    Physical Exam HENT:     Head: Normocephalic and atraumatic.     Mouth/Throat:     Pharynx: No oropharyngeal exudate.  Eyes:     Pupils: Pupils are equal, round, and reactive to light.  Cardiovascular:     Rate and Rhythm: Normal rate and regular rhythm.  Pulmonary:     Effort: Pulmonary effort is normal. No respiratory distress.     Breath sounds: Normal breath sounds. No wheezing.  Abdominal:     General: Bowel sounds are normal. There is no distension.     Palpations: Abdomen is soft. There is no mass.     Tenderness: There is no abdominal tenderness. There is no guarding or rebound.  Musculoskeletal:        General: No tenderness. Normal range of motion.     Cervical back: Normal range of motion and neck supple.  Skin:    General: Skin is warm.  Neurological:     Mental Status: She is alert and oriented  to person, place, and time.  Psychiatric:        Mood and Affect: Affect normal.      LABORATORY DATA:  I have reviewed the data as listed Lab Results  Component Value Date   WBC 5.8 03/06/2021   HGB 15.5 (H) 03/06/2021   HCT 45.9 03/06/2021   MCV 95.2 03/06/2021   PLT 229 03/06/2021   Recent Labs    07/28/20 0838 02/13/21 0805  NA 140 141  K 4.0 3.8  CL 102 103  CO2 30 32  GLUCOSE 109* 102*  BUN 19 16  CREATININE 0.79 0.77  CALCIUM 10.3 9.9  PROT 6.6 6.7  ALBUMIN 4.5 4.3  AST 14 12  ALT 23 20  ALKPHOS 32* 28*  BILITOT 0.8 1.0  BILIDIR  --  0.1    RADIOGRAPHIC STUDIES: I have personally reviewed the radiological images as listed and agreed with the findings in the report. No results found.  ASSESSMENT & PLAN:   Carcinoma of upper-outer quadrant of right breast in female, estrogen receptor positive ([place]) # Right breast cancer Stage IA ER/PR positive; her 2 NEG' Oncotype- low risk.  S/p RT [4/18 today].  Recommend adjuvant endocrine therapy.  Discussed the pros and cons of tamoxifen versus anastrozole-patient comfortable starting with anastrozole.  New prescription started.  Again reviewed the potential side effects including but not limited to joint pains/hot flashes/osteoporosis.  # OSTEOPOROSIS-T score= - 4.0 on Fosomax [ JAN 1962].  Again reviewed the potential side effects of bisphosphonate-including but not limited to joint pain arthralgias; hypocalcemia; osteonecrosis of the jaw.  Patient is interested in Reclast however wants to wait scheduling at this time.  She will let us know at next visit.   # Genetics: Discussed generics counseling.   # DISPOSITION: # Follow up in 2 months- MD; No labs- Dr.B  All questions were answered. The patient/family knows to call the clinic with any problems, questions or concerns.    Cammie Sickle, MD 03/20/2021 11:25 AM

## 2021-03-22 NOTE — Addendum Note (Signed)
Addended by: Alisa Graff on: 03/22/2021 12:40 AM   Modules accepted: Orders

## 2021-03-22 NOTE — Telephone Encounter (Signed)
Orders placed for labs.  Ok to schedule lab appt prior to October appt.

## 2021-03-22 NOTE — Telephone Encounter (Signed)
Appt scheduled

## 2021-03-22 NOTE — Telephone Encounter (Signed)
Please schedule fasting labs prior to October appt with Dr Nicki Reaper

## 2021-03-28 ENCOUNTER — Telehealth: Payer: Self-pay | Admitting: *Deleted

## 2021-03-28 NOTE — Telephone Encounter (Signed)
Patient called requesting a return call to discuss her completing radiation therapy and her immunity and how long before she is safe to be around children who have been exposed to [place] at daycare.[phone]

## 2021-04-20 ENCOUNTER — Ambulatory Visit
Admission: RE | Admit: 2021-04-20 | Discharge: 2021-04-20 | Disposition: A | Discharge status: Home / Self Care | Payer: PPO | Source: Ambulatory Visit | Attending: Radiation Oncology | Admitting: Radiation Oncology

## 2021-04-20 ENCOUNTER — Encounter: Payer: Self-pay | Admitting: Radiation Oncology

## 2021-04-20 VITALS — BP 119/73 | HR 75 | Temp 98.0°F | Resp 16 | Wt 113.5 lb

## 2021-04-20 DIAGNOSIS — C50411 Malignant neoplasm of upper-outer quadrant of right female breast: Secondary | ICD-10-CM | POA: Insufficient documentation

## 2021-04-20 DIAGNOSIS — Z79811 Long term (current) use of aromatase inhibitors: Secondary | ICD-10-CM | POA: Diagnosis not present

## 2021-04-20 DIAGNOSIS — Z923 Personal history of irradiation: Secondary | ICD-10-CM | POA: Insufficient documentation

## 2021-04-20 DIAGNOSIS — Z170 Estrogen receptor positive status [ER+]: Secondary | ICD-10-CM | POA: Diagnosis not present

## 2021-04-20 NOTE — Progress Notes (Signed)
Radiation Oncology Follow up Note  Name: Tracy Frey   Date:   04/20/2021 MRN:  [id number] DOB: 08-Aug-1951    This 70 y.o. female presents to the clinic today for 1 month follow-up status post whole breast radiation to her right breast for stage Ia (T1 cN0 M0) ER/PR positive invasive mammary carcinoma.  REFERRING PROVIDER: Einar Pheasant, MD  HPI: Patient is a 70 year old female now at 1 month having completed whole breast radiation to her right breast for stage Ia ER/PR positive invasive mammary carcinoma.  Seen today in routine follow-up she is doing well.  She specifically denies breast tenderness cough or bone pain.  She states she is having some sensitivity in her supraclavicular region I have assured her this is not related to her disease or radiation treatments.  She has been started on.  Arimidex and is tolerating it well without side effect COMPLICATIONS OF TREATMENT: none  FOLLOW UP COMPLIANCE: keeps appointments   PHYSICAL EXAM:  BP 119/73   Pulse 75   Temp 98 F (36.7 C) (Tympanic)   Resp 16   Wt 113 lb 8 oz (51.5 kg)   BMI 19.48 kg/m  Lungs are clear to A&P cardiac examination essentially unremarkable with regular rate and rhythm. No dominant mass or nodularity is noted in either breast in 2 positions examined. Incision is well-healed. No axillary or supraclavicular adenopathy is appreciated. Cosmetic result is excellent.  Well-developed well-nourished patient in NAD. HEENT reveals PERLA, EOMI, discs not visualized.  Oral cavity is clear. No oral mucosal lesions are identified. Neck is clear without evidence of cervical or supraclavicular adenopathy. Lungs are clear to A&P. Cardiac examination is essentially unremarkable with regular rate and rhythm without murmur rub or thrill. Abdomen is benign with no organomegaly or masses noted. Motor sensory and DTR levels are equal and symmetric in the upper and lower extremities. Cranial nerves II through XII are grossly intact.  Proprioception is intact. No peripheral adenopathy or edema is identified. No motor or sensory levels are noted. Crude visual fields are within normal range.  RADIOLOGY RESULTS: No current films for review  PLAN: At this time patient is doing well 1 month out from whole breast radiation and pleased with her overall progress.  She continues on Arimidex without side effect.  I have asked to see her back in 4 to 5 months for follow-up.  She continues close follow-up care with medical oncology.  Patient knows to call with any concerns.  I would like to take this opportunity to thank you for allowing me to participate in the care of your patient.Noreene Filbert, MD

## 2021-05-22 ENCOUNTER — Inpatient Hospital Stay: Payer: PPO | Attending: Internal Medicine | Admitting: Internal Medicine

## 2021-05-22 DIAGNOSIS — Z79811 Long term (current) use of aromatase inhibitors: Secondary | ICD-10-CM | POA: Insufficient documentation

## 2021-05-22 DIAGNOSIS — E7800 Pure hypercholesterolemia, unspecified: Secondary | ICD-10-CM | POA: Insufficient documentation

## 2021-05-22 DIAGNOSIS — C50411 Malignant neoplasm of upper-outer quadrant of right female breast: Secondary | ICD-10-CM | POA: Diagnosis not present

## 2021-05-22 DIAGNOSIS — Z79899 Other long term (current) drug therapy: Secondary | ICD-10-CM | POA: Insufficient documentation

## 2021-05-22 DIAGNOSIS — Z170 Estrogen receptor positive status [ER+]: Secondary | ICD-10-CM

## 2021-05-22 DIAGNOSIS — Z7982 Long term (current) use of aspirin: Secondary | ICD-10-CM | POA: Diagnosis not present

## 2021-05-22 NOTE — Progress Notes (Signed)
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 [s/p TR- April, 18th, 2022]  # April mid START Anastrazole   # OSTEOPOROSIS:BMD- T score= -4.1 [Fosomax- Jan 2022]     # SURVIVORSHIP:   # GENETICS:   DIAGNOSIS: Right breast cancer  STAGE:   I      ;  GOALS: cure  CURRENT/MOST RECENT THERAPY : RT/AI    Carcinoma of upper-outer quadrant of right breast in female, estrogen receptor positive ([place])  02/06/2021 Initial Diagnosis   Carcinoma of upper-outer quadrant of right breast in female, estrogen receptor positive ([place])   02/06/2021 Cancer Staging   Staging form: Breast, AJCC 8th Edition - Pathologic: Stage IA (pT1c, pN0, cM0, G1, ER+, PR+, HER2-) - Signed by Cammie Sickle, MD on 02/06/2021  Stage prefix: Initial diagnosis  Histologic grading system: 3 grade system  Menopausal status: Postmenopausal       HISTORY OF PRESENTING ILLNESS:  Tracy Frey 70 y.o.  female stage I ER/PR positive HER2 negative breast cancer currently s/p adjuvant radiation-currently on anastrozole is here for follow-up.  Patient denies any shortness of breath or cough.  Denies any denies any chest pain.  Denies any significant hot flashes but denies any worsening joint pains.   Review of Systems  Constitutional:  Negative for chills, diaphoresis, fever, malaise/fatigue and weight loss.  HENT:  Negative for nosebleeds and sore throat.   Eyes:  Negative for double vision.  Respiratory:  Negative for cough, hemoptysis, sputum production, shortness of breath and wheezing.    Cardiovascular:  Negative for chest pain, palpitations, orthopnea and leg swelling.  Gastrointestinal:  Negative for abdominal pain, blood in stool, constipation, diarrhea, heartburn, melena, nausea and vomiting.  Genitourinary:  Negative for dysuria, frequency and urgency.  Musculoskeletal:  Negative for back pain and joint pain.  Skin: Negative.  Negative for itching and rash.  Neurological:  Negative for dizziness, tingling, focal weakness, weakness and headaches.  Endo/Heme/Allergies:  Does not bruise/bleed easily.  Psychiatric/Behavioral:  Negative for depression. The patient is not nervous/anxious and does not have insomnia.     MEDICAL HISTORY:  Past Medical History:  Diagnosis Date   Hypercholesterolemia    Seasonal allergies     SURGICAL HISTORY: Past Surgical History:  Procedure Laterality Date   ABDOMINAL HYSTERECTOMY     BREAST EXCISIONAL BIOPSY Right 2003   neg   BREAST LUMPECTOMY Right    benign   BREAST LUMPECTOMY WITH SENTINEL LYMPH NODE BIOPSY Right 01/13/2021   Procedure: BREAST LUMPECTOMY WITH SENTINEL LYMPH NODE BX;  Surgeon: Robert Bellow, MD;  Location: [facility] ORS;  Service: General;  Laterality: Right;   COLONOSCOPY WITH PROPOFOL N/A 06/25/2018   Procedure: COLONOSCOPY WITH PROPOFOL;  Surgeon: Manya Silvas, MD;  Location: [facility] ENDOSCOPY;  Service: Endoscopy;  Laterality: N/A;   PARTIAL HYSTERECTOMY  1988   secondary to fibroids and heavy bleeding and endometriosis   TONSILLECTOMY     age 62    SOCIAL HISTORY: Social History   Socioeconomic History   Marital status: Married    Spouse name: Not on file   Number of children: 2  Years of education: Not on file   Highest education level: Not on file  Occupational History    Employer: [employer]  Tobacco Use   Smoking status: Never   Smokeless tobacco: Never  Vaping Use   Vaping Use: Never used  Substance and Sexual Activity   Alcohol use: Yes    Alcohol/week: 0.0 standard drinks    Comment:  ocassionally   Drug use: No   Sexual activity: Not on file  Other Topics Concern   Not on file  Social History Narrative   Lives in [place] with husband; 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    Social Determinants of Health   Financial Resource Strain: Not on file  Food Insecurity: Not on file  Transportation Needs: Not on file  Physical Activity: Not on file  Stress: Not on file  Social Connections: Not on file  Intimate Partner Violence: Not on file    FAMILY HISTORY: Family History  Problem Relation Age of Onset   Hypercholesterolemia Mother    Hypothyroidism Sister    Hypercholesterolemia Sister    Breast cancer Maternal Aunt 80   Colon cancer Neg Hx     ALLERGIES:  is allergic to chocolate and sulfa antibiotics.  MEDICATIONS:  Current Outpatient Medications  Medication Sig Dispense Refill   alendronate (FOSAMAX) 70 MG tablet Take 1 tablet (70 mg total) by mouth once a week. Take with a full glass of water on an empty stomach. (Patient taking differently: Take 70 mg by mouth every Friday. Take with a full glass of water on an empty stomach.) 4 tablet 11   anastrozole (ARIMIDEX) 1 MG tablet Take 1 tablet (1 mg total) by mouth daily. 30 tablet 4   Calcium Citrate-Vitamin D (CALCIUM CITRATE + D PO) Take 2 tablets by mouth 2 (two) times daily.     cetirizine (ZYRTEC) 10 MG tablet Take 10 mg by mouth daily.     Cranberry 1000 MG CAPS Take 1 tablet by mouth every other day.     Fiber POWD Take 1 Dose by mouth daily. 1 dose = 1 tablespoon     Glucosamine-Chondroitin (OSTEO BI-FLEX REGULAR STRENGTH PO) Take 1 tablet by mouth daily.     Multiple Vitamins-Minerals (CENTRUM SILVER ADULT 50+) TABS Take 1 tablet by mouth daily.     Omega-3 Fatty Acids (FISH OIL) 1000 MG CAPS Take 1,000 mg by mouth daily.     Probiotic Product (PROBIOTIC-10 PO) Take 1 capsule by mouth daily.     rosuvastatin (CRESTOR) 10 MG tablet TAKE 1 TABLET BY MOUTH EVERY DAY 90 tablet 1    spironolactone (ALDACTONE) 50 MG tablet Take 50 mg by mouth daily.     aspirin EC 81 MG tablet Take 81 mg by mouth daily. (Patient not taking: Reported on 05/22/2021)     clindamycin-benzoyl peroxide (BENZACLIN) gel Apply 1 application topically 2 (two) times daily as needed (acne). (Patient not taking: Reported on 05/22/2021)     diphenhydrAMINE (BENADRYL) 25 MG tablet Take 25 mg by mouth at bedtime as needed for allergies. (Patient not taking: Reported on 05/22/2021)     doxycycline (VIBRAMYCIN) 100 MG capsule Take 100 mg by mouth daily as needed (acne). (Patient not taking: Reported on 05/22/2021)     tretinoin (RETIN-A) 0.05 % cream Apply topically at bedtime as needed. (Patient not taking: Reported on 05/22/2021)     TURMERIC PO Take 1 tablet by mouth every other day. (Patient not taking: Reported on 05/22/2021)  No current facility-administered medications for this visit.      Marland Kitchen  PHYSICAL EXAMINATION: ECOG PERFORMANCE STATUS: 0 - Asymptomatic  Vitals:   05/22/21 1047  BP: 118/69  Pulse: 77  Resp: 14  Temp: (!) 96.9 F (36.1 C)  SpO2: 100%   Filed Weights   05/22/21 1047  Weight: 114 lb (51.7 kg)    Physical Exam HENT:     Head: Normocephalic and atraumatic.     Mouth/Throat:     Pharynx: No oropharyngeal exudate.  Eyes:     Pupils: Pupils are equal, round, and reactive to light.  Cardiovascular:     Rate and Rhythm: Normal rate and regular rhythm.  Pulmonary:     Effort: Pulmonary effort is normal. No respiratory distress.     Breath sounds: Normal breath sounds. No wheezing.  Abdominal:     General: Bowel sounds are normal. There is no distension.     Palpations: Abdomen is soft. There is no mass.     Tenderness: no abdominal tenderness There is no guarding or rebound.  Musculoskeletal:        General: No tenderness. Normal range of motion.     Cervical back: Normal range of motion and neck supple.  Skin:    General: Skin is warm.  Neurological:     Mental  Status: She is alert and oriented to person, place, and time.  Psychiatric:        Mood and Affect: Affect normal.     LABORATORY DATA:  I have reviewed the data as listed Lab Results  Component Value Date   WBC 5.8 03/06/2021   HGB 15.5 (H) 03/06/2021   HCT 45.9 03/06/2021   MCV 95.2 03/06/2021   PLT 229 03/06/2021   Recent Labs    07/28/20 0838 02/13/21 0805  NA 140 141  K 4.0 3.8  CL 102 103  CO2 30 32  GLUCOSE 109* 102*  BUN 19 16  CREATININE 0.79 0.77  CALCIUM 10.3 9.9  PROT 6.6 6.7  ALBUMIN 4.5 4.3  AST 14 12  ALT 23 20  ALKPHOS 32* 28*  BILITOT 0.8 1.0  BILIDIR  --  0.1    RADIOGRAPHIC STUDIES: I have personally reviewed the radiological images as listed and agreed with the findings in the report. No results found.  ASSESSMENT & PLAN:   Carcinoma of upper-outer quadrant of right breast in female, estrogen receptor positive ([place]) # Right breast cancer Stage IA ER/PR positive; her 2 NEG' Oncotype- low risk.  S/p RT [03/20/2021].on anastrazole.   #Tolerating anastrozole fairly well without major side effects.  Continue anastrozole for total of 5 years [until spring 2027]  # OSTEOPOROSIS-T score= - 4.0 on Fosomax [ JAN 2022].  Again reviewed the potential side effects of bisphosphonate-including but not limited to joint pain arthralgias; hypocalcemia; osteonecrosis of the jaw.  Patient is interested in Reclast ; will plan in 1 week check BMP.  Recommend calcium plus vitamin D.  # Genetics: declined generics counseling; discussed low risk for any genetic predisposition for breast cancer.  # DISPOSITION: # in 1 week-lab- bmp; reclast; # Follow up in 3 months- MD; No labs- Dr.B  All questions were answered. The patient/family knows to call the clinic with any problems, questions or concerns.    Cammie Sickle, MD 05/28/2021 9:47 PM

## 2021-05-22 NOTE — Progress Notes (Signed)
Taking calcium supplements; and will like to know if she can take vitamin D as a supplement as well or will that be too much?  When being out in the sun her eczema cleared up not sure if that helps.

## 2021-05-22 NOTE — Patient Instructions (Signed)
#    STOP fosamax; as you plan is to start in reclast

## 2021-05-22 NOTE — Assessment & Plan Note (Addendum)
#   Right breast cancer Stage IA ER/PR positive; her 2 NEG' Oncotype- low risk.  S/p RT [03/20/2021].on anastrazole.   #Tolerating anastrozole fairly well without major side effects.  Continue anastrozole for total of 5 years [until spring 2027]  # OSTEOPOROSIS-T score= - 4.0 on Fosomax [ JAN 2022].  Again reviewed the potential side effects of bisphosphonate-including but not limited to joint pain arthralgias; hypocalcemia; osteonecrosis of the jaw.  Patient is interested in Reclast ; will plan in 1 week check BMP.  Recommend calcium plus vitamin D.  Recommend stopping Fosamax.   # Genetics: declined generics counseling; discussed low risk for any genetic predisposition for breast cancer.  # DISPOSITION: # in 1 week-lab- bmp; reclast; # Follow up in 3 months- MD; No labs- Dr.B  Addendum: Patient called the office later-declines declines at this time.  Would consider at next visit.

## 2021-05-24 ENCOUNTER — Telehealth: Payer: Self-pay | Admitting: *Deleted

## 2021-05-24 DIAGNOSIS — Z170 Estrogen receptor positive status [ER+]: Secondary | ICD-10-CM

## 2021-05-24 NOTE — Telephone Encounter (Signed)
Incoming call from patient. She has additional questions and concerns regarding the Reclast infusion. Discussed in length (45 mins conversation) patient's concerns and patient education regarding Reclast. Reviewed the Reclast side effects and route of Reclast and goals of care with the patient. Questions answered to her satisfaction and she   Patient reluctant to start on Reclast until after her upcoming dentist apt in August and her follow-up with Dr. Nicki Reaper (pcp). She is concerned about the need for a dental extraction in the future and she would like to discuss her risk factors with her dentist. I offered to call her dentist office and send her records to the dentist. Patient declined and stated that she wanted to talk to her dentist first before our clinic contact them.  Discussed the role in bone/muscle strengthening exercise and the importance of taking the calcium + d.  We discussed the Care program in details and the patient is interested. Care program referral was initiated per patient's request.  Patient requested that next apt with Dr. B be on 10/19 with possible reclast. Moved Dr. Reather Littler apt to the afternoon to accommodate patient so she does not have to come twice to clinic in one day. She does not want labs on this day given that her metb will be drawn on 10/4 with Dr. Nicki Reaper.  Apts made per request.

## 2021-05-28 ENCOUNTER — Encounter: Payer: Self-pay | Admitting: Internal Medicine

## 2021-05-29 ENCOUNTER — Ambulatory Visit: Payer: PPO

## 2021-05-29 ENCOUNTER — Other Ambulatory Visit: Payer: PPO

## 2021-06-08 ENCOUNTER — Other Ambulatory Visit: Payer: Self-pay | Admitting: Internal Medicine

## 2021-06-09 ENCOUNTER — Encounter: Payer: Self-pay | Admitting: Internal Medicine

## 2021-07-13 DIAGNOSIS — C50411 Malignant neoplasm of upper-outer quadrant of right female breast: Secondary | ICD-10-CM | POA: Diagnosis not present

## 2021-07-13 DIAGNOSIS — Z170 Estrogen receptor positive status [ER+]: Secondary | ICD-10-CM | POA: Diagnosis not present

## 2021-07-26 ENCOUNTER — Telehealth: Payer: Self-pay

## 2021-07-26 NOTE — Telephone Encounter (Signed)
Sorry sent to the wrong [facility].

## 2021-07-26 NOTE — Telephone Encounter (Signed)
Spoke with patient. reassured her it was ok to take the anastrozole with a normal diet. No diet changes need to be made.

## 2021-07-26 NOTE — Telephone Encounter (Signed)
Patient called with concerns about taking her anastrozole. She states when looking up the side effects it reads not to take with certain foods such as raw veggies, whole grains, and fruit. Patient takes medication daily at lunch. Pharmacy told her to take the medication at night. Patient is just concerned because that is all she eats during lunch time. She is worried the medication will not be as effective. Can someone please call her ASAP to go over the side effects of anastrozole.

## 2021-07-28 NOTE — Telephone Encounter (Signed)
Oral Chemotherapy Pharmacist Encounter   Ms. Wach returned my call, discussed her questions related to taking anastrozole.   Diet changes?: No changes needed. She was concerned about what food she could eat while on the anastrozole, she specifically mentioned reading to avoid raw fruits. The advise to avoid raw fruits is for patients having diarrhea from the anastrozole. The fructose found in fruits could worsen diarrhea. The incidence of diarrhea with anastrozole is 8-9%. Ms. Constantinides is not currently having diarrhea, she now understands that the advise online to avoid raw fruits does not apply to her.   With or with out food?: Can be taken with or without food. Ms. Sheasley wanted to know if she could take her anastrozole with her lunch, because she knows some medication absorption can be effected by food. I told her she can take her anastrozole with or without food. Food only slightly decreases the max concentration by 16%, not enough to change the administration to without food  Fatigue?: Possible, occurs in ~19% of patients. She reports driving to visit and help with her grandchildren several times a week in [place]. She thinks the is likely the cause of her fatigue but she wanted to know if it could be coming from the anastrozole.  All of her questions about anastrozole were answered and she knows to call with any other concerns or questions.   Darl Pikes, PharmD, BCPS, BCOP, CPP Hematology/Oncology Clinical Pharmacist [facility] [phone]  07/28/2021 11:54 AM

## 2021-08-14 ENCOUNTER — Other Ambulatory Visit: Payer: Self-pay | Admitting: Internal Medicine

## 2021-08-21 ENCOUNTER — Ambulatory Visit: Payer: PPO | Admitting: Internal Medicine

## 2021-08-24 ENCOUNTER — Inpatient Hospital Stay: Payer: PPO

## 2021-08-30 DIAGNOSIS — L309 Dermatitis, unspecified: Secondary | ICD-10-CM | POA: Diagnosis not present

## 2021-08-30 DIAGNOSIS — L700 Acne vulgaris: Secondary | ICD-10-CM | POA: Diagnosis not present

## 2021-08-30 DIAGNOSIS — B354 Tinea corporis: Secondary | ICD-10-CM | POA: Diagnosis not present

## 2021-08-30 DIAGNOSIS — D2372 Other benign neoplasm of skin of left lower limb, including hip: Secondary | ICD-10-CM | POA: Diagnosis not present

## 2021-08-31 ENCOUNTER — Other Ambulatory Visit: Payer: Self-pay | Admitting: Internal Medicine

## 2021-09-05 ENCOUNTER — Other Ambulatory Visit: Payer: PPO

## 2021-09-07 ENCOUNTER — Other Ambulatory Visit (INDEPENDENT_AMBULATORY_CARE_PROVIDER_SITE_OTHER): Payer: PPO

## 2021-09-07 ENCOUNTER — Other Ambulatory Visit: Payer: Self-pay

## 2021-09-07 ENCOUNTER — Encounter: Payer: PPO | Admitting: Internal Medicine

## 2021-09-07 DIAGNOSIS — E7800 Pure hypercholesterolemia, unspecified: Secondary | ICD-10-CM | POA: Diagnosis not present

## 2021-09-07 DIAGNOSIS — R739 Hyperglycemia, unspecified: Secondary | ICD-10-CM

## 2021-09-07 DIAGNOSIS — M810 Age-related osteoporosis without current pathological fracture: Secondary | ICD-10-CM

## 2021-09-07 LAB — HEMOGLOBIN A1C: Hgb A1c MFr Bld: 6 % (ref 4.6–6.5)

## 2021-09-07 LAB — LIPID PANEL
Cholesterol: 176 mg/dL (ref 0–200)
HDL: 60.3 mg/dL (ref >39.00)
LDL Cholesterol: 98 mg/dL (ref 0–99)
NonHDL: 115.32
Total CHOL/HDL Ratio: 3
Triglycerides: 88 mg/dL (ref 0.0–149.0)
VLDL: 17.6 mg/dL (ref 0.0–40.0)

## 2021-09-07 LAB — CBC WITH DIFFERENTIAL/PLATELET
Basophils Absolute: 0 10*3/uL (ref 0.0–0.1)
Basophils Relative: 0.9 % (ref 0.0–3.0)
Eosinophils Absolute: 0.2 10*3/uL (ref 0.0–0.7)
Eosinophils Relative: 3.7 % (ref 0.0–5.0)
HCT: 43.2 % (ref 36.0–46.0)
Hemoglobin: 14.4 g/dL (ref 12.0–15.0)
Lymphocytes Relative: 16.5 % (ref 12.0–46.0)
Lymphs Abs: 0.7 10*3/uL (ref 0.7–4.0)
MCHC: 33.4 g/dL (ref 30.0–36.0)
MCV: 95.7 fl (ref 78.0–100.0)
Monocytes Absolute: 0.6 10*3/uL (ref 0.1–1.0)
Monocytes Relative: 13.1 % — ABNORMAL HIGH (ref 3.0–12.0)
Neutro Abs: 2.8 10*3/uL (ref 1.4–7.7)
Neutrophils Relative %: 65.8 % (ref 43.0–77.0)
Platelets: 212 10*3/uL (ref 150.0–400.0)
RBC: 4.51 Mil/uL (ref 3.87–5.11)
RDW: 12.8 % (ref 11.5–15.5)
WBC: 4.2 10*3/uL (ref 4.0–10.5)

## 2021-09-07 LAB — BASIC METABOLIC PANEL
BUN: 15 mg/dL (ref 6–23)
CO2: 31 mEq/L (ref 19–32)
Calcium: 9.9 mg/dL (ref 8.4–10.5)
Chloride: 103 mEq/L (ref 96–112)
Creatinine, Ser: 0.77 mg/dL (ref 0.40–1.20)
GFR: 78.98 mL/min (ref >60.00)
Glucose, Bld: 100 mg/dL — ABNORMAL HIGH (ref 70–99)
Potassium: 3.7 mEq/L (ref 3.5–5.1)
Sodium: 143 mEq/L (ref 135–145)

## 2021-09-07 LAB — HEPATIC FUNCTION PANEL
ALT: 19 U/L (ref 0–35)
AST: 13 U/L (ref 0–37)
Albumin: 4.4 g/dL (ref 3.5–5.2)
Alkaline Phosphatase: 25 U/L — ABNORMAL LOW (ref 39–117)
Bilirubin, Direct: 0.2 mg/dL (ref 0.0–0.3)
Total Bilirubin: 1 mg/dL (ref 0.2–1.2)
Total Protein: 6.4 g/dL (ref 6.0–8.3)

## 2021-09-07 LAB — VITAMIN D 25 HYDROXY (VIT D DEFICIENCY, FRACTURES): VITD: 65.05 ng/mL (ref 30.00–100.00)

## 2021-09-07 LAB — TSH: TSH: 2.4 u[IU]/mL (ref 0.35–5.50)

## 2021-09-11 ENCOUNTER — Telehealth: Payer: Self-pay

## 2021-09-12 ENCOUNTER — Other Ambulatory Visit: Payer: Self-pay

## 2021-09-12 ENCOUNTER — Ambulatory Visit (INDEPENDENT_AMBULATORY_CARE_PROVIDER_SITE_OTHER): Payer: PPO | Admitting: Internal Medicine

## 2021-09-12 ENCOUNTER — Telehealth: Payer: Self-pay | Admitting: Internal Medicine

## 2021-09-12 VITALS — BP 122/72 | HR 88 | Temp 98.0°F | Resp 16 | Ht 64.0 in | Wt 114.8 lb

## 2021-09-12 DIAGNOSIS — E7800 Pure hypercholesterolemia, unspecified: Secondary | ICD-10-CM

## 2021-09-12 DIAGNOSIS — Z170 Estrogen receptor positive status [ER+]: Secondary | ICD-10-CM

## 2021-09-12 DIAGNOSIS — Z23 Encounter for immunization: Secondary | ICD-10-CM | POA: Diagnosis not present

## 2021-09-12 DIAGNOSIS — M810 Age-related osteoporosis without current pathological fracture: Secondary | ICD-10-CM

## 2021-09-12 DIAGNOSIS — R739 Hyperglycemia, unspecified: Secondary | ICD-10-CM | POA: Diagnosis not present

## 2021-09-12 DIAGNOSIS — Z0000 Encounter for general adult medical examination without abnormal findings: Secondary | ICD-10-CM

## 2021-09-12 DIAGNOSIS — F439 Reaction to severe stress, unspecified: Secondary | ICD-10-CM | POA: Diagnosis not present

## 2021-09-12 DIAGNOSIS — C50411 Malignant neoplasm of upper-outer quadrant of right female breast: Secondary | ICD-10-CM | POA: Diagnosis not present

## 2021-09-12 DIAGNOSIS — Z9109 Other allergy status, other than to drugs and biological substances: Secondary | ICD-10-CM | POA: Diagnosis not present

## 2021-09-12 NOTE — Telephone Encounter (Signed)
Patient scheduled for labs 03/2022 as requested by check out note.   Needing orders placed.

## 2021-09-12 NOTE — Progress Notes (Signed)
Patient ID: Tracy Frey, female   DOB: 1951/10/26, 70 y.o.   MRN: [id number]   Subjective:    Patient ID: Tracy Frey, female    DOB: October 23, 1951, 70 y.o.   MRN: [id number]  This visit occurred during the SARS-CoV-2 public health emergency.  Safety protocols were in place, including screening questions prior to the visit, additional usage of staff PPE, and extensive cleaning of exam room while observing appropriate contact time as indicated for disinfecting solutions.   Patient here for her physical exam.   Chief Complaint  Patient presents with   Annual Exam   .   HPI Recently diagnosed with breast cancer.  S/p right lumpectomy.  Followed by Dr Bary Castilla.  On arimidex.  Tolerating.  Had covid 08/2021.  No residual problems.  Stays active.  No chest pain or sob reported.  No abdominal pain or bowel change.  Walking and walking with hand weights.  Handling stress.     Past Medical History:  Diagnosis Date   Hypercholesterolemia    Seasonal allergies    Past Surgical History:  Procedure Laterality Date   ABDOMINAL HYSTERECTOMY     BREAST EXCISIONAL BIOPSY Right 2003   neg   BREAST LUMPECTOMY Right    benign   BREAST LUMPECTOMY WITH SENTINEL LYMPH NODE BIOPSY Right 01/13/2021   Procedure: BREAST LUMPECTOMY WITH SENTINEL LYMPH NODE BX;  Surgeon: Robert Bellow, MD;  Location: [facility] ORS;  Service: General;  Laterality: Right;   COLONOSCOPY WITH PROPOFOL N/A 06/25/2018   Procedure: COLONOSCOPY WITH PROPOFOL;  Surgeon: Manya Silvas, MD;  Location: [facility] ENDOSCOPY;  Service: Endoscopy;  Laterality: N/A;   PARTIAL HYSTERECTOMY  1988   secondary to fibroids and heavy bleeding and endometriosis   TONSILLECTOMY     age 39   Family History  Problem Relation Age of Onset   Hypercholesterolemia Mother    Hypothyroidism Sister    Hypercholesterolemia Sister    Breast cancer Maternal Aunt 80   Colon cancer Neg Hx    Social History   Socioeconomic History   Marital status:  Married    Spouse name: Not on file   Number of children: 2   Years of education: Not on file   Highest education level: Not on file  Occupational History    Employer: [employer]  Tobacco Use   Smoking status: Never   Smokeless tobacco: Never  Vaping Use   Vaping Use: Never used  Substance and Sexual Activity   Alcohol use: Yes    Alcohol/week: 0.0 standard drinks    Comment: ocassionally   Drug use: No   Sexual activity: Not on file  Other Topics Concern   Not on file  Social History Narrative   Lives in [place] with husband; 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    Social Determinants of Health   Financial Resource Strain: Not on file  Food Insecurity: Not on file  Transportation Needs: Not on file  Physical Activity: Not on file  Stress: Not on file  Social Connections: Not on file     Review of Systems  Constitutional:  Negative for appetite change and unexpected weight change.  HENT:  Negative for congestion, sinus pressure and sore throat.   Eyes:  Negative for pain and visual disturbance.  Respiratory:  Negative for cough, chest tightness and shortness of breath.   Cardiovascular:  Negative for chest pain, palpitations and leg swelling.  Gastrointestinal:  Negative for abdominal pain, diarrhea, nausea and vomiting.  Genitourinary:  Negative for difficulty urinating and dysuria.  Musculoskeletal:  Negative for joint swelling and myalgias.  Skin:  Negative for color change and rash.  Neurological:  Negative for dizziness, light-headedness and headaches.  Hematological:  Negative for adenopathy. Does not bruise/bleed easily.  Psychiatric/Behavioral:  Negative for agitation and dysphoric mood.       Objective:     BP 122/72   Pulse 88   Temp 98 F (36.7 C)   Resp 16   Ht _0  (1.626 m)   Wt 114 lb 12.8 oz (52.1 kg)   SpO2 99%   BMI 19.71 kg/m  Wt Readings from Last 3 Encounters:  09/12/21 114 lb 12.8 oz (52.1 kg)  05/22/21 114 lb  (51.7 kg)  04/20/21 113 lb 8 oz (51.5 kg)    Physical Exam Vitals reviewed.  Constitutional:      General: She is not in acute distress.    Appearance: Normal appearance. She is well-developed.  HENT:     Head: Normocephalic and atraumatic.     Right Ear: External ear normal.     Left Ear: External ear normal.  Eyes:     General: No scleral icterus.       Right eye: No discharge.        Left eye: No discharge.     Conjunctiva/sclera: Conjunctivae normal.  Neck:     Thyroid: No thyromegaly.  Cardiovascular:     Rate and Rhythm: Normal rate and regular rhythm.  Pulmonary:     Effort: No tachypnea, accessory muscle usage or respiratory distress.     Breath sounds: Normal breath sounds. No decreased breath sounds or wheezing.     Comments: S/p right breast lumpectomy.  Chest:  Breasts:    Right: No inverted nipple, mass, nipple discharge or tenderness (no axillary adenopathy).     Left: No inverted nipple, mass, nipple discharge or tenderness (no axilarry adenopathy).  Abdominal:     General: Bowel sounds are normal.     Palpations: Abdomen is soft.     Tenderness: There is no abdominal tenderness.  Musculoskeletal:        General: No swelling or tenderness.     Cervical back: Neck supple. No tenderness.  Lymphadenopathy:     Cervical: No cervical adenopathy.  Skin:    Findings: No erythema or rash.  Neurological:     Mental Status: She is alert and oriented to person, place, and time.  Psychiatric:        Mood and Affect: Mood normal.        Behavior: Behavior normal.     Outpatient Encounter Medications as of 09/12/2021  Medication Sig   alendronate (FOSAMAX) 70 MG tablet TAKE 1 TABLET (70 MG TOTAL) BY MOUTH ONCE A WEEK. TAKE WITH A FULL GLASS OF WATER ON AN EMPTY STOMACH.   anastrozole (ARIMIDEX) 1 MG tablet TAKE 1 TABLET BY MOUTH EVERY DAY   Calcium Citrate-Vitamin D (CALCIUM CITRATE + D PO) Take 2 tablets by mouth 2 (two) times daily.   cetirizine (ZYRTEC) 10 MG  tablet Take 10 mg by mouth daily.   clindamycin-benzoyl peroxide (BENZACLIN) gel Apply 1 application topically 2 (two) times daily as needed (acne).   diphenhydrAMINE (BENADRYL) 25 MG tablet Take 25 mg by mouth at bedtime as needed for allergies.   doxycycline (VIBRAMYCIN) 100 MG capsule Take 100 mg by mouth daily as needed (acne).   Fiber POWD Take 1 Dose by mouth daily. 1 dose = 1 tablespoon  Glucosamine-Chondroitin (OSTEO BI-FLEX REGULAR STRENGTH PO) Take 1 tablet by mouth daily.   Multiple Vitamins-Minerals (CENTRUM SILVER ADULT 50+) TABS Take 1 tablet by mouth daily.   Omega-3 Fatty Acids (FISH OIL) 1000 MG CAPS Take 1,000 mg by mouth daily.   Probiotic Product (PROBIOTIC-10 PO) Take 1 capsule by mouth daily.   rosuvastatin (CRESTOR) 10 MG tablet TAKE 1 TABLET BY MOUTH EVERY DAY   tretinoin (RETIN-A) 0.05 % cream Apply topically at bedtime as needed.   [DISCONTINUED] aspirin EC 81 MG tablet Take 81 mg by mouth daily. (Patient not taking: No sig reported)   [DISCONTINUED] Cranberry 1000 MG CAPS Take 1 tablet by mouth every other day.   [DISCONTINUED] spironolactone (ALDACTONE) 50 MG tablet Take 50 mg by mouth daily.   [DISCONTINUED] TURMERIC PO Take 1 tablet by mouth every other day. (Patient not taking: Reported on 05/22/2021)   No facility-administered encounter medications on file as of 09/12/2021.     Lab Results  Component Value Date   WBC 4.2 09/07/2021   HGB 14.4 09/07/2021   HCT 43.2 09/07/2021   PLT 212.0 09/07/2021   GLUCOSE 100 (H) 09/07/2021   CHOL 176 09/07/2021   TRIG 88.0 09/07/2021   HDL 60.30 09/07/2021   LDLDIRECT 168.1 04/17/2013   LDLCALC 98 09/07/2021   ALT 19 09/07/2021   AST 13 09/07/2021   NA 143 09/07/2021   K 3.7 09/07/2021   CL 103 09/07/2021   CREATININE 0.77 09/07/2021   BUN 15 09/07/2021   CO2 31 09/07/2021   TSH 2.40 09/07/2021   HGBA1C 6.0 09/07/2021       Assessment & Plan:   Problem List Items Addressed This Visit     Breast cancer  ([place])    On arimidex and tolerating.  Followed by Dr Rogue Bussing and Dr Bary Castilla.  S/p right lumpectomy.       Environmental allergies    Appears to be controlled.       Health care maintenance    Physical today 09/12/21.  Colonoscopy 06/2018.  Recommended f/u in 10 years.  Recent diagnosis of breast cancer - being followed by Dr Bary Castilla.       Hypercholesterolemia    Tolerating crestor.  Cholesterol improved.  Follow lipid panel and liver function tests.        Hyperglycemia    Follow met b and a1c.       Osteoporosis    Discussed reclast infusion.  Continue calcium, vitamin D and weight bearing exercise.        Stress    Overall appears to be handling things relatively well.  Follow.  Does not feel needs anything more at this time.        Other Visit Diagnoses     Routine general medical examination at a health care facility    -  Primary   Need for immunization against influenza       Relevant Orders   Flu Vaccine QUAD High Dose(Fluad) (Completed)        Einar Pheasant, MD

## 2021-09-13 NOTE — Addendum Note (Signed)
Addended by: Lars Masson on: 09/13/2021 02:09 PM   Modules accepted: Orders

## 2021-09-13 NOTE — Telephone Encounter (Signed)
Future labs ordered.  

## 2021-09-17 ENCOUNTER — Encounter: Payer: Self-pay | Admitting: Internal Medicine

## 2021-09-17 DIAGNOSIS — C50919 Malignant neoplasm of unspecified site of unspecified female breast: Secondary | ICD-10-CM | POA: Insufficient documentation

## 2021-09-17 NOTE — Assessment & Plan Note (Signed)
Follow met b and a1c.  

## 2021-09-17 NOTE — Assessment & Plan Note (Signed)
Physical today 09/12/21.  Colonoscopy 06/2018.  Recommended f/u in 10 years.  Recent diagnosis of breast cancer - being followed by Dr Bary Castilla.

## 2021-09-17 NOTE — Assessment & Plan Note (Signed)
Discussed reclast infusion.  Continue calcium, vitamin D and weight bearing exercise.

## 2021-09-17 NOTE — Assessment & Plan Note (Signed)
Overall appears to be handling things relatively well.  Follow.  Does not feel needs anything more at this time.

## 2021-09-17 NOTE — Assessment & Plan Note (Signed)
Tolerating crestor.  Cholesterol improved.  Follow lipid panel and liver function tests.

## 2021-09-17 NOTE — Assessment & Plan Note (Signed)
On arimidex and tolerating.  Followed by Dr Rogue Bussing and Dr Bary Castilla.  S/p right lumpectomy.

## 2021-09-17 NOTE — Assessment & Plan Note (Signed)
Appears to be controlled.

## 2021-09-20 ENCOUNTER — Inpatient Hospital Stay: Payer: PPO | Attending: Internal Medicine | Admitting: Internal Medicine

## 2021-09-20 ENCOUNTER — Inpatient Hospital Stay: Payer: PPO

## 2021-09-20 ENCOUNTER — Encounter: Payer: Self-pay | Admitting: Radiation Oncology

## 2021-09-20 ENCOUNTER — Other Ambulatory Visit: Payer: Self-pay

## 2021-09-20 ENCOUNTER — Ambulatory Visit
Admission: RE | Admit: 2021-09-20 | Discharge: 2021-09-20 | Disposition: A | Discharge status: Home / Self Care | Payer: PPO | Source: Ambulatory Visit | Attending: Radiation Oncology | Admitting: Radiation Oncology

## 2021-09-20 DIAGNOSIS — Z170 Estrogen receptor positive status [ER+]: Secondary | ICD-10-CM | POA: Diagnosis not present

## 2021-09-20 DIAGNOSIS — M810 Age-related osteoporosis without current pathological fracture: Secondary | ICD-10-CM

## 2021-09-20 DIAGNOSIS — Z79811 Long term (current) use of aromatase inhibitors: Secondary | ICD-10-CM | POA: Insufficient documentation

## 2021-09-20 DIAGNOSIS — C50411 Malignant neoplasm of upper-outer quadrant of right female breast: Secondary | ICD-10-CM | POA: Diagnosis not present

## 2021-09-20 DIAGNOSIS — Z923 Personal history of irradiation: Secondary | ICD-10-CM | POA: Insufficient documentation

## 2021-09-20 DIAGNOSIS — Z08 Encounter for follow-up examination after completed treatment for malignant neoplasm: Secondary | ICD-10-CM | POA: Diagnosis not present

## 2021-09-20 DIAGNOSIS — Z79899 Other long term (current) drug therapy: Secondary | ICD-10-CM | POA: Insufficient documentation

## 2021-09-20 MED ORDER — ZOLEDRONIC ACID 5 MG/100ML IV SOLN
5.0000 mg | Freq: Once | INTRAVENOUS | Status: AC
  Administered 2021-09-20: 5 mg via INTRAVENOUS
  Filled 2021-09-20: qty 100

## 2021-09-20 MED ORDER — ANASTROZOLE 1 MG PO TABS: 1.0000 mg | ORAL_TABLET | Freq: Every day | ORAL | 3 refills | Status: DC

## 2021-09-20 MED ORDER — SODIUM CHLORIDE 0.9 % IV SOLN
Freq: Once | INTRAVENOUS | Status: AC
  Filled 2021-09-20: qty 250

## 2021-09-20 NOTE — Progress Notes (Signed)
Radiation Oncology Follow up Note  Name: Tracy Frey   Date:   09/20/2021 MRN:  [id number] DOB: 1951-05-02    This 69 y.o. 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  REFERRING PROVIDER: Einar Pheasant, MD  HPI: Patient is a 70 year old female now out 6 months having completed whole breast radiation to her right breast for ER/PR positive invasive mammary carcinoma.  Seen today in routine follow-up she is doing well.  She specifically denies breast tenderness cough or bone pain..  She has not yet had repeat breast imaging.  She is currently on Arimidex tolerating that well without side effect.  COMPLICATIONS OF TREATMENT: none  FOLLOW UP COMPLIANCE: keeps appointments   PHYSICAL EXAM:  BP (!) (P) 142/75 (BP Location: Left Arm, Patient Position: Sitting)   Pulse (P) 84   Resp (P) 16   Wt (P) 114 lb 4.8 oz (51.8 kg)   BMI (P) 19.62 kg/m  Lungs are clear to A&P cardiac examination essentially unremarkable with regular rate and rhythm. No dominant mass or nodularity is noted in either breast in 2 positions examined. Incision is well-healed. No axillary or supraclavicular adenopathy is appreciated. Cosmetic result is excellent.  Well-developed well-nourished patient in NAD. HEENT reveals PERLA, EOMI, discs not visualized.  Oral cavity is clear. No oral mucosal lesions are identified. Neck is clear without evidence of cervical or supraclavicular adenopathy. Lungs are clear to A&P. Cardiac examination is essentially unremarkable with regular rate and rhythm without murmur rub or thrill. Abdomen is benign with no organomegaly or masses noted. Motor sensory and DTR levels are equal and symmetric in the upper and lower extremities. Cranial nerves II through XII are grossly intact. Proprioception is intact. No peripheral adenopathy or edema is identified. No  motor or sensory levels are noted. Crude visual fields are within normal range.  RADIOLOGY RESULTS: No current films to review  PLAN: Present time she is now at 6 months with very low side effect profile.  And pleased with her overall progress.  I will see her back in 6 months for follow-up.  She is already scheduled for follow-up mammograms.  Patient continues on Arimidex without side effect.  Patient knows to call with any concerns.  I would like to take this opportunity to thank you for allowing me to participate in the care of your patient.Noreene Filbert, MD

## 2021-09-20 NOTE — Assessment & Plan Note (Addendum)
#   Right breast cancer Stage IA ER/PR positive; her 2 NEG' Oncotype- low risk.  S/p RT [03/20/2021].on anastrazole; STABLE.   #Tolerating anastrozole fairly well without major side effects.  Continue anastrozole for total of 5 years [until spring 2027]  # OSTEOPOROSIS-T score= - 4.0 on Fosomax [ JAN 2022].  Again reviewed the potential side effects of bisphosphonate-including but not limited to joint pain arthralgias; hypocalcemia; osteonecrosis of the jaw.  Patient is interested in [place] today.  Continue calcium plus vitamin D.  Recommend stopping Fosamax.  Recent calcium from October 2022 9.5 normal.  # DISPOSITION: # today reclast; # Follow up in 6 months- MD- Dr.B

## 2021-09-20 NOTE — Progress Notes (Signed)
Pt tolerated reclast infusion well with no problems or complaints.  Pt left infusion suite stable and ambulatory.

## 2021-09-20 NOTE — Patient Instructions (Signed)
Zoledronic Acid Injection (Paget's Disease, Osteoporosis) What is this medication? ZOLEDRONIC ACID (ZOE le dron ik AS id) slows calcium loss from bones. It treats Paget's disease and osteoporosis. It may be used in other people at risk for bone loss. This medicine may be used for other purposes; ask your health care provider or pharmacist if you have questions. COMMON BRAND NAME(S): Reclast, Zometa What should I tell my care team before I take this medication? They need to know if you have any of these conditions: bleeding disorder cancer dental disease kidney disease low levels of calcium in the blood low red blood cell counts lung or breathing disease (asthma) receiving steroids like dexamethasone or prednisone an unusual or allergic reaction to zoledronic acid, other medicines, foods, dyes, or preservatives pregnant or trying to get pregnant breast-feeding How should I use this medication? This drug is injected into a vein. It is given by a health care provider in a hospital or clinic setting. A special MedGuide will be given to you before each treatment. Be sure to read this information carefully each time. Talk to your health care provider about the use of this drug in children. Special care may be needed. Overdosage: If you think you have taken too much of this medicine contact a poison control center or emergency room at once. NOTE: This medicine is only for you. Do not share this medicine with others. What if I miss a dose? Keep appointments for follow-up doses. It is important not to miss your dose. Call your health care provider if you are unable to keep an appointment. What may interact with this medication? certain antibiotics given by injection NSAIDs, medicines for pain and inflammation, like ibuprofen or naproxen some diuretics like bumetanide, furosemide teriparatide This list may not describe all possible interactions. Give your health care provider a list of all the  medicines, herbs, non-prescription drugs, or dietary supplements you use. Also tell them if you smoke, drink alcohol, or use illegal drugs. Some items may interact with your medicine. What should I watch for while using this medication? Visit your health care provider for regular checks on your progress. It may be some time before you see the benefit from this drug. Some people who take this drug have severe bone, joint, or muscle pain. This drug may also increase your risk for jaw problems or a broken thigh bone. Tell your health care provider right away if you have severe pain in your jaw, bones, joints, or muscles. Tell you health care provider if you have any pain that does not go away or that gets worse. You should make sure you get enough calcium and vitamin D while you are taking this drug. Discuss the foods you eat and the vitamins you take with your health care provider. You may need blood work done while you are taking this drug. Tell your dentist and dental surgeon that you are taking this drug. You should not have major dental surgery while on this drug. See your dentist to have a dental exam and fix any dental problems before starting this drug. Take good care of your teeth while on this drug. Make sure you see your dentist for regular follow-up appointments. What side effects may I notice from receiving this medication? Side effects that you should report to your doctor or health care provider as soon as possible: allergic reactions (skin rash, itching or hives; swelling of the face, lips, or tongue) bone pain infection (fever, chills, cough, sore throat, pain  or trouble passing urine) jaw pain, especially after dental work joint pain kidney injury (trouble passing urine or change in the amount of urine) low calcium levels (fast heartbeat; muscle cramps or pain; pain, tingling, or numbness in the hands or feet; seizures) low red blood cell counts (trouble breathing; feeling faint;  lightheaded, falls; unusually weak or tired) muscle pain palpitations redness, blistering, peeling, or loosening of the skin, including inside the mouth Side effects that usually do not require medical attention (report to your doctor or health care provider if they continue or are bothersome): diarrhea eye irritation, itching, or pain fever general ill feeling or flu-like symptoms headache increase in blood pressure nausea pain, redness, or irritation at site where injected stomach pain upset stomach This list may not describe all possible side effects. Call your doctor for medical advice about side effects. You may report side effects to FDA at 1-800-FDA-1088. Where should I keep my medication? This drug is given in a hospital or clinic. It will not be stored at home. NOTE: This sheet is a summary. It may not cover all possible information. If you have questions about this medicine, talk to your doctor, pharmacist, or health care provider.  2022 Elsevier/Gold Standard (2019-09-07 11:46:18)

## 2021-09-20 NOTE — Progress Notes (Signed)
Survivorship Care Plan visit completed.  Treatment summary reviewed and given to patient.  ASCO answers booklet reviewed and given to patient.  CARE program and Cancer Transitions discussed with patient along with other resources cancer center offers to patients and caregivers.  Patient verbalized understanding.    

## 2021-09-20 NOTE — Progress Notes (Signed)
[facility] NOTE  Patient Care Team: Einar Pheasant, MD as PCP - General (Internal Medicine) Einar Pheasant, MD (Internal Medicine) Cammie Sickle, MD as Consulting Physician (Internal Medicine) Bary Castilla Forest Gleason, MD as Consulting Physician (General Surgery) Noreene Filbert, MD as Consulting Physician (Radiation Oncology)  CHIEF COMPLAINTS/PURPOSE OF CONSULTATION: Breast cancer  #  Oncology History Overview Note  # RIGHT BREAST INVASIVE MAMMARY CARCINOMA- STAGE IA- pT1c sn pN0; ER- > 90%/PR 51-90%; Her-2 NEG [s/p Lumpec; SLNBx; Dr.Byrnett]; Oncotype DX score: 8, less than 1% chance of benefit from adjuvant chemotherapy. [s/p TR- April, 18th, 2022]  # April mid -2022- START Anastrazole   # OSTEOPOROSIS:BMD- T score= -4.1 [Fosomax- Jan 2022]; 09/20/2021- Reclast    # SURVIVORSHIP:   # GENETICS: declined  DIAGNOSIS: Right breast cancer  STAGE:   I      ;  GOALS: cure  CURRENT/MOST RECENT THERAPY : RT/AI    Carcinoma of upper-outer quadrant of right breast in female, estrogen receptor positive ([name])  02/06/2021 Initial Diagnosis   Carcinoma of upper-outer quadrant of right breast in female, estrogen receptor positive ([name])   02/06/2021 Cancer Staging   Staging form: Breast, AJCC 8th Edition - Pathologic: Stage IA (pT1c, pN0, cM0, G1, ER+, PR+, HER2-) - Signed by Cammie Sickle, MD on 02/06/2021 Stage prefix: Initial diagnosis Histologic grading system: 3 grade system Menopausal status: Postmenopausal      HISTORY OF PRESENTING ILLNESS:  Tracy Frey 70 y.o.  female stage I ER/PR positive HER2 negative breast cancer currently s/p adjuvant radiation-currently on anastrozole is here for follow-up.  Patient had evaluation with radiation oncology this morning.  Denies any shortness of breath or cough no chest pain.  No hot flashes.  No worsening joint pains.  Review of Systems  Constitutional:  Negative for chills, diaphoresis, fever,  malaise/fatigue and weight loss.  HENT:  Negative for nosebleeds and sore throat.   Eyes:  Negative for double vision.  Respiratory:  Negative for cough, hemoptysis, sputum production, shortness of breath and wheezing.   Cardiovascular:  Negative for chest pain, palpitations, orthopnea and leg swelling.  Gastrointestinal:  Negative for abdominal pain, blood in stool, constipation, diarrhea, heartburn, melena, nausea and vomiting.  Genitourinary:  Negative for dysuria, frequency and urgency.  Musculoskeletal:  Negative for back pain and joint pain.  Skin: Negative.  Negative for itching and rash.  Neurological:  Negative for dizziness, tingling, focal weakness, weakness and headaches.  Endo/Heme/Allergies:  Does not bruise/bleed easily.  Psychiatric/Behavioral:  Negative for depression. The patient is not nervous/anxious and does not have insomnia.     MEDICAL HISTORY:  Past Medical History:  Diagnosis Date   Hypercholesterolemia    Seasonal allergies     SURGICAL HISTORY: Past Surgical History:  Procedure Laterality Date   ABDOMINAL HYSTERECTOMY     BREAST EXCISIONAL BIOPSY Right 2003   neg   BREAST LUMPECTOMY Right    benign   BREAST LUMPECTOMY WITH SENTINEL LYMPH NODE BIOPSY Right 01/13/2021   Procedure: BREAST LUMPECTOMY WITH SENTINEL LYMPH NODE BX;  Surgeon: Robert Bellow, MD;  Location: [facility] ORS;  Service: General;  Laterality: Right;   COLONOSCOPY WITH PROPOFOL N/A 06/25/2018   Procedure: COLONOSCOPY WITH PROPOFOL;  Surgeon: Manya Silvas, MD;  Location: [facility] ENDOSCOPY;  Service: Endoscopy;  Laterality: N/A;   PARTIAL HYSTERECTOMY  1988   secondary to fibroids and heavy bleeding and endometriosis   TONSILLECTOMY     age 59    SOCIAL HISTORY: Social  History   Socioeconomic History   Marital status: Married    Spouse name: Not on file   Number of children: 2   Years of education: Not on file   Highest education level: Not on file  Occupational History     Employer: [employer]  Tobacco Use   Smoking status: Never   Smokeless tobacco: Never  Vaping Use   Vaping Use: Never used  Substance and Sexual Activity   Alcohol use: Yes    Alcohol/week: 0.0 standard drinks    Comment: ocassionally   Drug use: No   Sexual activity: Not on file  Other Topics Concern   Not on file  Social History Narrative   Lives in [place] with husband; 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    Social Determinants of Health   Financial Resource Strain: Not on file  Food Insecurity: Not on file  Transportation Needs: Not on file  Physical Activity: Not on file  Stress: Not on file  Social Connections: Not on file  Intimate Partner Violence: Not on file    FAMILY HISTORY: Family History  Problem Relation Age of Onset   Hypercholesterolemia Mother    Hypothyroidism Sister    Hypercholesterolemia Sister    Breast cancer Maternal Aunt 80   Colon cancer Neg Hx     ALLERGIES:  is allergic to chocolate and sulfa antibiotics.  MEDICATIONS:  Current Outpatient Medications  Medication Sig Dispense Refill   alendronate (FOSAMAX) 70 MG tablet TAKE 1 TABLET (70 MG TOTAL) BY MOUTH ONCE A WEEK. TAKE WITH A FULL GLASS OF WATER ON AN EMPTY STOMACH. 12 tablet 3   anastrozole (ARIMIDEX) 1 MG tablet Take 1 tablet (1 mg total) by mouth daily. 90 tablet 3   Calcium Citrate-Vitamin D (CALCIUM CITRATE + D PO) Take 2 tablets by mouth 2 (two) times daily.     cetirizine (ZYRTEC) 10 MG tablet Take 10 mg by mouth daily.     clindamycin-benzoyl peroxide (BENZACLIN) gel Apply 1 application topically 2 (two) times daily as needed (acne).     diphenhydrAMINE (BENADRYL) 25 MG tablet Take 25 mg by mouth at bedtime as needed for allergies.     doxycycline (VIBRAMYCIN) 100 MG capsule Take 100 mg by mouth daily as needed (acne).     Fiber POWD Take 1 Dose by mouth daily. 1 dose = 1 tablespoon     Glucosamine-Chondroitin (OSTEO BI-FLEX REGULAR STRENGTH PO) Take 1 tablet  by mouth daily.     Multiple Vitamins-Minerals (CENTRUM SILVER ADULT 50+) TABS Take 1 tablet by mouth daily.     Omega-3 Fatty Acids (FISH OIL) 1000 MG CAPS Take 1,000 mg by mouth daily.     Probiotic Product (PROBIOTIC-10 PO) Take 1 capsule by mouth daily.     rosuvastatin (CRESTOR) 10 MG tablet TAKE 1 TABLET BY MOUTH EVERY DAY 90 tablet 1   tretinoin (RETIN-A) 0.05 % cream Apply topically at bedtime as needed.     No current facility-administered medications for this visit.   Facility-Administered Medications Ordered in Other Visits  Medication Dose Route Frequency Provider Last Rate Last Admin   0.9 %  sodium chloride infusion   Intravenous Once Charlaine Dalton R, MD       zoledronic acid (RECLAST) injection 5 mg  5 mg Intravenous Once Cammie Sickle, MD          .  PHYSICAL EXAMINATION: ECOG PERFORMANCE STATUS: 0 - Asymptomatic  Vitals:   09/20/21 1330  BP: 137/72  Pulse: 89  Resp: 18  Temp: 97.6 F (36.4 C)   Filed Weights   09/20/21 1330  Weight: 114 lb (51.7 kg)    Physical Exam HENT:     Head: Normocephalic and atraumatic.     Mouth/Throat:     Pharynx: No oropharyngeal exudate.  Eyes:     Pupils: Pupils are equal, round, and reactive to light.  Cardiovascular:     Rate and Rhythm: Normal rate and regular rhythm.  Pulmonary:     Effort: Pulmonary effort is normal. No respiratory distress.     Breath sounds: Normal breath sounds. No wheezing.  Abdominal:     General: Bowel sounds are normal. There is no distension.     Palpations: Abdomen is soft. There is no mass.     Tenderness: There is no abdominal tenderness. There is no guarding or rebound.  Musculoskeletal:        General: No tenderness. Normal range of motion.     Cervical back: Normal range of motion and neck supple.  Skin:    General: Skin is warm.  Neurological:     Mental Status: She is alert and oriented to person, place, and time.  Psychiatric:        Mood and Affect: Affect  normal.     LABORATORY DATA:  I have reviewed the data as listed Lab Results  Component Value Date   WBC 4.2 09/07/2021   HGB 14.4 09/07/2021   HCT 43.2 09/07/2021   MCV 95.7 09/07/2021   PLT 212.0 09/07/2021   Recent Labs    02/13/21 0805 09/07/21 0804  NA 141 143  K 3.8 3.7  CL 103 103  CO2 32 31  GLUCOSE 102* 100*  BUN 16 15  CREATININE 0.77 0.77  CALCIUM 9.9 9.9  PROT 6.7 6.4  ALBUMIN 4.3 4.4  AST 12 13  ALT 20 19  ALKPHOS 28* 25*  BILITOT 1.0 1.0  BILIDIR 0.1 0.2    RADIOGRAPHIC STUDIES: I have personally reviewed the radiological images as listed and agreed with the findings in the report. No results found.  ASSESSMENT & PLAN:   Carcinoma of upper-outer quadrant of right breast in female, estrogen receptor positive ([place]) # Right breast cancer Stage IA ER/PR positive; her 2 NEG' Oncotype- low risk.  S/p RT [03/20/2021].on anastrazole; STABLE.   #Tolerating anastrozole fairly well without major side effects.  Continue anastrozole for total of 5 years [until spring 2027]  # OSTEOPOROSIS-T score= - 4.0 on Fosomax [ JAN 2022].  Again reviewed the potential side effects of bisphosphonate-including but not limited to joint pain arthralgias; hypocalcemia; osteonecrosis of the jaw.  Patient is interested in [place] today.  Continue calcium plus vitamin D.  Recommend stopping Fosamax.  Recent calcium from October 2022 9.5 normal.  # DISPOSITION: # today reclast; # Follow up in 6 months- MD- Dr.B  All questions were answered. The patient/family knows to call the clinic with any problems, questions or concerns.    Cammie Sickle, MD 09/20/2021 2:03 PM

## 2021-09-22 ENCOUNTER — Telehealth: Payer: Self-pay | Admitting: *Deleted

## 2021-09-22 NOTE — Telephone Encounter (Signed)
Patient c/o generalized body aches yesterday after her reclast.  Patient woke up with Jaw pain when she yawns. This is not severe. Pt wanted to know if this is a known side effect.   I explained to patient that joint pain is a common side effect. She can take extra strength tylenol every 6- 8 hours for joint pain. This should help with the joint pain. I asked patient to continue to monitor the symptoms and let us know if the joint pain does not improve with the use of Tylenol.

## 2021-10-05 ENCOUNTER — Encounter: Payer: Self-pay | Admitting: Internal Medicine

## 2021-10-05 NOTE — Telephone Encounter (Signed)
See previous note on 10/10

## 2021-11-07 ENCOUNTER — Other Ambulatory Visit: Payer: Self-pay | Admitting: General Surgery

## 2021-11-07 DIAGNOSIS — C50411 Malignant neoplasm of upper-outer quadrant of right female breast: Secondary | ICD-10-CM

## 2021-11-14 ENCOUNTER — Other Ambulatory Visit: Payer: Self-pay | Admitting: Internal Medicine

## 2021-11-16 DIAGNOSIS — H43813 Vitreous degeneration, bilateral: Secondary | ICD-10-CM | POA: Diagnosis not present

## 2021-11-16 DIAGNOSIS — H5213 Myopia, bilateral: Secondary | ICD-10-CM | POA: Diagnosis not present

## 2021-11-16 DIAGNOSIS — H52223 Regular astigmatism, bilateral: Secondary | ICD-10-CM | POA: Diagnosis not present

## 2021-11-16 DIAGNOSIS — H524 Presbyopia: Secondary | ICD-10-CM | POA: Diagnosis not present

## 2021-11-16 DIAGNOSIS — H40022 Open angle with borderline findings, high risk, left eye: Secondary | ICD-10-CM | POA: Diagnosis not present

## 2021-11-16 DIAGNOSIS — H2513 Age-related nuclear cataract, bilateral: Secondary | ICD-10-CM | POA: Diagnosis not present

## 2021-11-18 NOTE — Addendum Note (Signed)
Addended by: Robert Bellow on: 11/18/2021 03:33 PM   Modules accepted: Orders

## 2021-12-06 ENCOUNTER — Encounter: Payer: Self-pay | Admitting: Internal Medicine

## 2021-12-06 NOTE — Telephone Encounter (Signed)
Error

## 2021-12-12 ENCOUNTER — Encounter: Payer: Self-pay | Admitting: Internal Medicine

## 2021-12-13 ENCOUNTER — Encounter: Payer: Self-pay | Admitting: Internal Medicine

## 2021-12-14 ENCOUNTER — Other Ambulatory Visit: Payer: Self-pay

## 2021-12-14 ENCOUNTER — Ambulatory Visit
Admission: RE | Admit: 2021-12-14 | Discharge: 2021-12-14 | Disposition: A | Discharge status: Home / Self Care | Payer: PPO | Source: Ambulatory Visit | Attending: General Surgery | Admitting: General Surgery

## 2021-12-14 DIAGNOSIS — C50411 Malignant neoplasm of upper-outer quadrant of right female breast: Secondary | ICD-10-CM | POA: Diagnosis not present

## 2021-12-14 DIAGNOSIS — R922 Inconclusive mammogram: Secondary | ICD-10-CM | POA: Diagnosis not present

## 2021-12-14 HISTORY — DX: Personal history of irradiation: Z92.3

## 2021-12-26 DIAGNOSIS — Z170 Estrogen receptor positive status [ER+]: Secondary | ICD-10-CM | POA: Diagnosis not present

## 2021-12-26 DIAGNOSIS — C50411 Malignant neoplasm of upper-outer quadrant of right female breast: Secondary | ICD-10-CM | POA: Diagnosis not present

## 2022-01-23 ENCOUNTER — Telehealth: Payer: Self-pay | Admitting: Internal Medicine

## 2022-01-23 DIAGNOSIS — T50905A Adverse effect of unspecified drugs, medicaments and biological substances, initial encounter: Secondary | ICD-10-CM | POA: Diagnosis not present

## 2022-01-23 DIAGNOSIS — R030 Elevated blood-pressure reading, without diagnosis of hypertension: Secondary | ICD-10-CM | POA: Diagnosis not present

## 2022-01-23 NOTE — Telephone Encounter (Signed)
Patient returned office phone call. 

## 2022-01-23 NOTE — Telephone Encounter (Signed)
Patient called and stated that her bp top number is 194, she did not remember lower number. She has the chills, no fever, no headache, a little light headed.

## 2022-01-23 NOTE — Telephone Encounter (Signed)
[facility] nurse was not working during time of call.

## 2022-01-23 NOTE — Telephone Encounter (Signed)
Patient called in stating that she has had cold symptoms for a couple of weeks. Taking sudafed. Last night- noticed she wasn't feeling well. Hands and feet felt cold and tingling. Couldn't get warm. This morning her BP was 194 over something (did not know the bottom number) and she is feeling light headed intermittently, having chills, and feeling cold. Advised that given her symptoms she needs to be evaluated. She was eating some lunch when I talked to her then was going to go to urgent care. Advised I would send message over to you as well.

## 2022-01-23 NOTE — Telephone Encounter (Signed)
Agree with significant elevated blood pressure, light headedness, etc - needs to be evaluated.

## 2022-01-23 NOTE — Telephone Encounter (Signed)
LM for patient

## 2022-03-08 ENCOUNTER — Other Ambulatory Visit (INDEPENDENT_AMBULATORY_CARE_PROVIDER_SITE_OTHER): Payer: PPO

## 2022-03-08 DIAGNOSIS — E7800 Pure hypercholesterolemia, unspecified: Secondary | ICD-10-CM

## 2022-03-08 DIAGNOSIS — R739 Hyperglycemia, unspecified: Secondary | ICD-10-CM

## 2022-03-08 LAB — BASIC METABOLIC PANEL
BUN: 21 mg/dL (ref 6–23)
CO2: 31 mEq/L (ref 19–32)
Calcium: 10 mg/dL (ref 8.4–10.5)
Chloride: 103 mEq/L (ref 96–112)
Creatinine, Ser: 0.79 mg/dL (ref 0.40–1.20)
GFR: 76.32 mL/min (ref >60.00)
Glucose, Bld: 107 mg/dL — ABNORMAL HIGH (ref 70–99)
Potassium: 3.9 mEq/L (ref 3.5–5.1)
Sodium: 141 mEq/L (ref 135–145)

## 2022-03-08 LAB — HEPATIC FUNCTION PANEL
ALT: 25 U/L (ref 0–35)
AST: 17 U/L (ref 0–37)
Albumin: 4.6 g/dL (ref 3.5–5.2)
Alkaline Phosphatase: 22 U/L — ABNORMAL LOW (ref 39–117)
Bilirubin, Direct: 0.2 mg/dL (ref 0.0–0.3)
Total Bilirubin: 1 mg/dL (ref 0.2–1.2)
Total Protein: 6.8 g/dL (ref 6.0–8.3)

## 2022-03-08 LAB — HEMOGLOBIN A1C: Hgb A1c MFr Bld: 6 % (ref 4.6–6.5)

## 2022-03-08 LAB — LIPID PANEL
Cholesterol: 190 mg/dL (ref 0–200)
HDL: 66 mg/dL (ref >39.00)
LDL Cholesterol: 108 mg/dL — ABNORMAL HIGH (ref 0–99)
NonHDL: 124.2
Total CHOL/HDL Ratio: 3
Triglycerides: 83 mg/dL (ref 0.0–149.0)
VLDL: 16.6 mg/dL (ref 0.0–40.0)

## 2022-03-13 ENCOUNTER — Encounter: Payer: Self-pay | Admitting: Internal Medicine

## 2022-03-13 ENCOUNTER — Ambulatory Visit (INDEPENDENT_AMBULATORY_CARE_PROVIDER_SITE_OTHER): Payer: PPO | Admitting: Internal Medicine

## 2022-03-13 VITALS — BP 152/70 | HR 81 | Temp 98.1°F | Resp 21 | Ht 64.0 in | Wt 113.7 lb

## 2022-03-13 DIAGNOSIS — Z0000 Encounter for general adult medical examination without abnormal findings: Secondary | ICD-10-CM

## 2022-03-13 DIAGNOSIS — F439 Reaction to severe stress, unspecified: Secondary | ICD-10-CM

## 2022-03-13 DIAGNOSIS — R739 Hyperglycemia, unspecified: Secondary | ICD-10-CM | POA: Diagnosis not present

## 2022-03-13 DIAGNOSIS — Z1329 Encounter for screening for other suspected endocrine disorder: Secondary | ICD-10-CM

## 2022-03-13 DIAGNOSIS — E7800 Pure hypercholesterolemia, unspecified: Secondary | ICD-10-CM | POA: Diagnosis not present

## 2022-03-13 DIAGNOSIS — C50411 Malignant neoplasm of upper-outer quadrant of right female breast: Secondary | ICD-10-CM

## 2022-03-13 DIAGNOSIS — Z170 Estrogen receptor positive status [ER+]: Secondary | ICD-10-CM | POA: Diagnosis not present

## 2022-03-13 DIAGNOSIS — R030 Elevated blood-pressure reading, without diagnosis of hypertension: Secondary | ICD-10-CM

## 2022-03-13 DIAGNOSIS — M810 Age-related osteoporosis without current pathological fracture: Secondary | ICD-10-CM | POA: Diagnosis not present

## 2022-03-13 NOTE — Progress Notes (Signed)
Patient ID: Tracy Frey, female   DOB: [birth year], 71 y.o.   MRN: [id number] ? ? ?Subjective:  ? ? Patient ID: Tracy Frey, female    DOB: 1951-11-18, 71 y.o.   MRN: [id number] ? ?This visit occurred during the SARS-CoV-2 public health emergency.  Safety protocols were in place, including screening questions prior to the visit, additional usage of staff PPE, and extensive cleaning of exam room while observing appropriate contact time as indicated for disinfecting solutions.  ? ?Patient here for a scheduled follow up.  ? ?Chief Complaint  ?Patient presents with  ? Follow-up  ?  78mof/u for HTN  ? .  ? ?HPI ?Followed by Dr BBary Castillafor right breast cancer s/p lumpectomy 01/13/21.  On arimidex.  Mammogram and ultrasound 12/2021.  Stable.  Recommended f/u in one year.  Saw Dr BRogue Bussing10/2022 - recommended continuing arimidex for 5 years total.  Reclast.  Recommended f/u in 6 months.  Has v/u with Dr CDonella Stade4/18/23.  F/u with oncology 4/19.  No chest pain.  Tries to stay active.  No sob reported.  No abdominal pain or bowel change reported.  Discussed recent labs and goal LDL.  Discussed diet and exercise.   ? ? ?Past Medical History:  ?Diagnosis Date  ? Hypercholesterolemia   ? Personal history of radiation therapy   ? Seasonal allergies   ? ?Past Surgical History:  ?Procedure Laterality Date  ? ABDOMINAL HYSTERECTOMY    ? BREAST EXCISIONAL BIOPSY Right 2003  ? neg  ? BREAST LUMPECTOMY Right   ? benign  ? BREAST LUMPECTOMY WITH SENTINEL LYMPH NODE BIOPSY Right 01/13/2021  ? Procedure: BREAST LUMPECTOMY WITH SENTINEL LYMPH NODE BX;  Surgeon: BRobert Bellow MD;  Location: [facility] ORS;  Service: General;  Laterality: Right;  ? COLONOSCOPY WITH PROPOFOL N/A 06/25/2018  ? Procedure: COLONOSCOPY WITH PROPOFOL;  Surgeon: EManya Silvas MD;  [facility];  Service: Endoscopy;  Laterality: N/A;  ? PARTIAL HYSTERECTOMY  1988  ? secondary to fibroids and heavy bleeding and endometriosis  ? TONSILLECTOMY    ?  age 71 ? ?Family History  ?Problem Relation Age of Onset  ? Hypercholesterolemia Mother   ? Hypothyroidism Sister   ? Hypercholesterolemia Sister   ? Breast cancer Maternal Aunt 80  ? Colon cancer Neg Hx   ? ?Social History  ? ?Socioeconomic History  ? Marital status: Married  ?  Spouse name: Not on file  ? Number of children: 2  ? Years of education: Not on file  ? Highest education level: Not on file  ?Occupational History  ?  Employer: [employer]  ?Tobacco Use  ? Smoking status: Never  ? Smokeless tobacco: Never  ?Vaping Use  ? Vaping Use: Never used  ?Substance and Sexual Activity  ? Alcohol use: Yes  ?  Alcohol/week: 0.0 standard drinks  ?  Comment: ocassionally  ? Drug use: No  ? Sexual activity: Not on file  ?Other Topics Concern  ? Not on file  ?Social History Narrative  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   ? ?Social Determinants of Health  ? ?Financial Resource Strain: Not on file  ?Food Insecurity: Not on file  ?Transportation Needs: Not on file  ?Physical Activity: Not on file  ?Stress: Not on file  ?Social Connections: Not on file  ? ? ? ?Review of Systems  ?Constitutional:  Negative for appetite change and unexpected weight change.  ?HENT:  Negative for congestion and sinus pressure.   ?Respiratory:  Negative for cough, chest tightness and shortness of breath.   ?Cardiovascular:  Negative for chest pain, palpitations and leg swelling.  ?Gastrointestinal:  Negative for abdominal pain, diarrhea, nausea and vomiting.  ?Genitourinary:  Negative for difficulty urinating and dysuria.  ?Musculoskeletal:  Negative for joint swelling and myalgias.  ?Skin:  Negative for color change and rash.  ?Neurological:  Negative for dizziness, light-headedness and headaches.  ?Psychiatric/Behavioral:  Negative for agitation and dysphoric mood.   ? ?   ?Objective:  ?  ? ?BP (!) 152/70 (BP Location: Left Arm, Patient Position: Sitting, Cuff Size: Small)   Pulse 81   Temp  98.1 ?F (36.7 ?C) (Temporal)   Resp (!) 21   Ht '5\' 4"'$  (1.626 m)   Wt 113 lb 11.2 oz (51.6 kg)   SpO2 99%   BMI 19.52 kg/m?  ?Wt Readings from Last 3 Encounters:  ?03/13/22 113 lb 11.2 oz (51.6 kg)  ?09/20/21 (P) 114 lb 4.8 oz (51.8 kg)  ?09/20/21 114 lb (51.7 kg)  ? ? ?Physical Exam ?Vitals reviewed.  ?Constitutional:   ?   General: She is not in acute distress. ?   Appearance: Normal appearance.  ?HENT:  ?   Head: Normocephalic and atraumatic.  ?   Right Ear: External ear normal.  ?   Left Ear: External ear normal.  ?Eyes:  ?   General: No scleral icterus.    ?   Right eye: No discharge.     ?   Left eye: No discharge.  ?   Conjunctiva/sclera: Conjunctivae normal.  ?Neck:  ?   Thyroid: No thyromegaly.  ?Cardiovascular:  ?   Rate and Rhythm: Normal rate and regular rhythm.  ?Pulmonary:  ?   Effort: No respiratory distress.  ?   Breath sounds: Normal breath sounds. No wheezing.  ?Abdominal:  ?   General: Bowel sounds are normal.  ?   Palpations: Abdomen is soft.  ?   Tenderness: There is no abdominal tenderness.  ?Musculoskeletal:     ?   General: No swelling or tenderness.  ?   Cervical back: Neck supple. No tenderness.  ?Lymphadenopathy:  ?   Cervical: No cervical adenopathy.  ?Skin: ?   Findings: No erythema or rash.  ?Neurological:  ?   Mental Status: She is alert.  ?Psychiatric:     ?   Mood and Affect: Mood normal.     ?   Behavior: Behavior normal.  ? ? ? ?Outpatient Encounter Medications as of 03/13/2022  ?Medication Sig  ? anastrozole (ARIMIDEX) 1 MG tablet Take 1 tablet (1 mg total) by mouth daily.  ? Calcium Citrate-Vitamin D (CALCIUM CITRATE + D PO) Take 2 tablets by mouth 2 (two) times daily.  ? cetirizine (ZYRTEC) 10 MG tablet Take 10 mg by mouth daily.  ? clindamycin-benzoyl peroxide (BENZACLIN) gel Apply 1 application topically 2 (two) times daily as needed (acne).  ? diphenhydrAMINE (BENADRYL) 25 MG tablet Take 25 mg by mouth at bedtime as needed for allergies.  ? doxycycline (VIBRAMYCIN) 100 MG  capsule Take 100 mg by mouth daily as needed (acne).  ? Fiber POWD Take 1 Dose by mouth daily. 1 dose = 1 tablespoon  ? Glucosamine-Chondroitin (OSTEO BI-FLEX REGULAR STRENGTH PO) Take 1 tablet by mouth daily.  ? Multiple Vitamins-Minerals (CENTRUM SILVER ADULT 50+) TABS Take 1 tablet by mouth daily.  ? Omega-3 Fatty Acids (FISH OIL) 1000 MG CAPS Take 1,000 mg by  mouth daily.  ? Probiotic Product (PROBIOTIC-10 PO) Take 1 capsule by mouth daily.  ? rosuvastatin (CRESTOR) 20 MG tablet Take 1 tablet (20 mg total) by mouth daily.  ? tretinoin (RETIN-A) 0.05 % cream Apply topically at bedtime as needed.  ? [DISCONTINUED] rosuvastatin (CRESTOR) 10 MG tablet TAKE 1 TABLET BY MOUTH EVERY DAY  ? [DISCONTINUED] alendronate (FOSAMAX) 70 MG tablet TAKE 1 TABLET (70 MG TOTAL) BY MOUTH ONCE A WEEK. TAKE WITH A FULL GLASS OF WATER ON AN EMPTY STOMACH. (Patient not taking: Reported on 03/13/2022)  ? ?No facility-administered encounter medications on file as of 03/13/2022.  ?  ? ?Lab Results  ?Component Value Date  ? WBC 4.2 09/07/2021  ? HGB 14.4 09/07/2021  ? HCT 43.2 09/07/2021  ? PLT 212.0 09/07/2021  ? GLUCOSE 107 (H) 03/08/2022  ? CHOL 190 03/08/2022  ? TRIG 83.0 03/08/2022  ? HDL 66.00 03/08/2022  ? LDLDIRECT 168.1 04/17/2013  ? LDLCALC 108 (H) 03/08/2022  ? ALT 25 03/08/2022  ? AST 17 03/08/2022  ? NA 141 03/08/2022  ? K 3.9 03/08/2022  ? CL 103 03/08/2022  ? CREATININE 0.79 03/08/2022  ? BUN 21 03/08/2022  ? CO2 31 03/08/2022  ? TSH 2.40 09/07/2021  ? HGBA1C 6.0 03/08/2022  ? ? ?MM DIAG BREAST TOMO BILATERAL ? ?Result Date: 12/14/2021 ?CLINICAL DATA:  Status post RIGHT lumpectomy in February 2022 EXAM: DIGITAL DIAGNOSTIC BILATERAL MAMMOGRAM WITH TOMOSYNTHESIS AND CAD TECHNIQUE: Bilateral digital diagnostic mammography and breast tomosynthesis was performed. The images were evaluated with computer-aided detection. COMPARISON:  Previous exam(s). ACR Breast Density Category c: The breast tissue is heterogeneously dense, which may  obscure small masses. FINDINGS: There is density and architectural distortion within the RIGHT breast, consistent with postsurgical changes. These are new in comparison to prior, consistent with interval lu

## 2022-03-18 ENCOUNTER — Encounter: Payer: Self-pay | Admitting: Internal Medicine

## 2022-03-18 MED ORDER — ROSUVASTATIN CALCIUM 20 MG PO TABS: 20.0000 mg | ORAL_TABLET | Freq: Every day | ORAL | 1 refills | Status: DC

## 2022-03-18 NOTE — Assessment & Plan Note (Signed)
Tolerating crestor.  Increase crestor to '20mg'$  q day. Follow lipid panel and liver function tests.   ?

## 2022-03-18 NOTE — Assessment & Plan Note (Signed)
Overall appears to be handling things relatively well.  Follow.  Does not feel needs anything more at this time.   ?

## 2022-03-18 NOTE — Assessment & Plan Note (Signed)
Have her spot check her pressure.  Send in readings.  If persistent elevation, will require medication ?

## 2022-03-18 NOTE — Assessment & Plan Note (Signed)
Follow met b and a1c.  ?

## 2022-03-18 NOTE — Assessment & Plan Note (Signed)
Receiving reclast.  Continue calcium, vitamin D and weight bearing exercise.  

## 2022-03-18 NOTE — Assessment & Plan Note (Signed)
On arimidex and tolerating.  Receiving reclast.  Mammogram as outlined.  Has f/u planned next week.   ?

## 2022-03-21 ENCOUNTER — Ambulatory Visit
Admission: RE | Admit: 2022-03-21 | Discharge: 2022-03-21 | Disposition: A | Discharge status: Home / Self Care | Payer: PPO | Source: Ambulatory Visit | Attending: Radiation Oncology | Admitting: Radiation Oncology

## 2022-03-21 ENCOUNTER — Encounter: Payer: Self-pay | Admitting: Internal Medicine

## 2022-03-21 ENCOUNTER — Inpatient Hospital Stay: Payer: PPO | Attending: Internal Medicine | Admitting: Internal Medicine

## 2022-03-21 ENCOUNTER — Encounter: Payer: Self-pay | Admitting: Radiation Oncology

## 2022-03-21 VITALS — BP 141/57 | HR 81 | Temp 97.2°F | Resp 16 | Ht 64.0 in | Wt 112.2 lb

## 2022-03-21 DIAGNOSIS — M810 Age-related osteoporosis without current pathological fracture: Secondary | ICD-10-CM | POA: Diagnosis not present

## 2022-03-21 DIAGNOSIS — C50411 Malignant neoplasm of upper-outer quadrant of right female breast: Secondary | ICD-10-CM | POA: Insufficient documentation

## 2022-03-21 DIAGNOSIS — Z90710 Acquired absence of both cervix and uterus: Secondary | ICD-10-CM | POA: Insufficient documentation

## 2022-03-21 DIAGNOSIS — Z170 Estrogen receptor positive status [ER+]: Secondary | ICD-10-CM | POA: Insufficient documentation

## 2022-03-21 DIAGNOSIS — Z923 Personal history of irradiation: Secondary | ICD-10-CM | POA: Diagnosis not present

## 2022-03-21 DIAGNOSIS — Z803 Family history of malignant neoplasm of breast: Secondary | ICD-10-CM | POA: Insufficient documentation

## 2022-03-21 DIAGNOSIS — Z79811 Long term (current) use of aromatase inhibitors: Secondary | ICD-10-CM | POA: Insufficient documentation

## 2022-03-21 DIAGNOSIS — Z08 Encounter for follow-up examination after completed treatment for malignant neoplasm: Secondary | ICD-10-CM | POA: Diagnosis not present

## 2022-03-21 NOTE — Progress Notes (Signed)
[facility] ?CONSULT NOTE ? ?Patient Care Team: ?Einar Pheasant, MD as PCP - General (Internal Medicine) ?Einar Pheasant, MD (Internal Medicine) ?Cammie Sickle, MD as Consulting Physician (Internal Medicine) ?Robert Bellow, MD as Consulting Physician (General Surgery) ?Noreene Filbert, MD as Consulting Physician (Radiation Oncology) ? ?CHIEF COMPLAINTS/PURPOSE OF CONSULTATION: Breast cancer ? ?#  ?Oncology History Overview Note  ?# RIGHT BREAST INVASIVE MAMMARY CARCINOMA- STAGE IA- pT1c sn pN0; ER- > 90%/PR 51-90%; Her-2 NEG [s/p Lumpec; SLNBx; Dr.Byrnett]; Oncotype DX score: 8, less than 1% chance of benefit from adjuvant chemotherapy. [s/p TR- April, 18th, 2022] ? ?# April mid -2022- START Anastrazole  ? ?# OSTEOPOROSIS:BMD- T score= -4.1 [Fosomax- Jan 2022]; 09/20/2021- Reclast  ? ? ?# SURVIVORSHIP:  ? ?# GENETICS: declined ? ?DIAGNOSIS: Right breast cancer ? ?STAGE:   I      ;  GOALS: cure ? ?CURRENT/MOST RECENT THERAPY : RT/AI ? ?  ?Carcinoma of upper-outer quadrant of right breast in female, estrogen receptor positive (Tracy Frey)  ?02/06/2021 Initial Diagnosis  ? Carcinoma of upper-outer quadrant of right breast in female, estrogen receptor positive (Tracy Frey) ?  ?02/06/2021 Cancer Staging  ? Staging form: Breast, AJCC 8th Edition ?- Pathologic: Stage IA (pT1c, pN0, cM0, G1, ER+, PR+, HER2-) - Signed by Cammie Sickle, MD on 02/06/2021 ?Stage prefix: Initial diagnosis ?Histologic grading system: 3 grade system ?Menopausal status: Postmenopausal ? ?  ? ? ? ?HISTORY OF PRESENTING ILLNESS: Alone.  Ambulating independently. ?Tracy Frey 71 y.o.  female stage I ER/PR positive HER2 negative breast cancer currently s/p adjuvant radiation-currently on anastrozole is here for follow-up. ? ?Patient denies any worsening joint pains.  Denies any worsening hot flashes.  No shortness of breath or cough. ? ? ?Review of Systems  ?Constitutional:  Negative for chills, diaphoresis, fever, malaise/fatigue  and weight loss.  ?HENT:  Negative for nosebleeds and sore throat.   ?Eyes:  Negative for double vision.  ?Respiratory:  Negative for cough, hemoptysis, sputum production, shortness of breath and wheezing.   ?Cardiovascular:  Negative for chest pain, palpitations, orthopnea and leg swelling.  ?Gastrointestinal:  Negative for abdominal pain, blood in stool, constipation, diarrhea, heartburn, melena, nausea and vomiting.  ?Genitourinary:  Negative for dysuria, frequency and urgency.  ?Musculoskeletal:  Negative for back pain and joint pain.  ?Skin: Negative.  Negative for itching and rash.  ?Neurological:  Negative for dizziness, tingling, focal weakness, weakness and headaches.  ?Endo/Heme/Allergies:  Does not bruise/bleed easily.  ?Psychiatric/Behavioral:  Negative for depression. The patient is not nervous/anxious and does not have insomnia.    ? ?MEDICAL HISTORY:  ?Past Medical History:  ?Diagnosis Date  ? Hypercholesterolemia   ? Personal history of radiation therapy   ? Seasonal allergies   ? ? ?SURGICAL HISTORY: ?Past Surgical History:  ?Procedure Laterality Date  ? ABDOMINAL HYSTERECTOMY    ? BREAST EXCISIONAL BIOPSY Right 2003  ? neg  ? BREAST LUMPECTOMY Right   ? benign  ? BREAST LUMPECTOMY WITH SENTINEL LYMPH NODE BIOPSY Right 01/13/2021  ? Procedure: BREAST LUMPECTOMY WITH SENTINEL LYMPH NODE BX;  Surgeon: Robert Bellow, MD;  Location: [facility] ORS;  Service: General;  Laterality: Right;  ? COLONOSCOPY WITH PROPOFOL N/A 06/25/2018  ? Procedure: COLONOSCOPY WITH PROPOFOL;  Surgeon: Manya Silvas, MD;  Location: [facility];  Service: Endoscopy;  Laterality: N/A;  ? PARTIAL HYSTERECTOMY  1988  ? secondary to fibroids and heavy bleeding and endometriosis  ? TONSILLECTOMY    ? age 89  ? ? ?  SOCIAL HISTORY: ?Social History  ? ?Socioeconomic History  ? Marital status: Married  ?  Spouse name: Not on file  ? Number of children: 2  ? Years of education: Not on file  ? Highest education level: Not on file   ?Occupational History  ?  Employer: [employer]  ?Tobacco Use  ? Smoking status: Never  ? Smokeless tobacco: Never  ?Vaping Use  ? Vaping Use: Never used  ?Substance and Sexual Activity  ? Alcohol use: Yes  ?  Alcohol/week: 0.0 standard drinks  ?  Comment: ocassionally  ? Drug use: No  ? Sexual activity: Not on file  ?Other Topics Concern  ? Not on file  ?Social History Narrative  ? Lives in [place] with husband; 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   ? ?Social Determinants of Health  ? ?Financial Resource Strain: Not on file  ?Food Insecurity: Not on file  ?Transportation Needs: Not on file  ?Physical Activity: Not on file  ?Stress: Not on file  ?Social Connections: Not on file  ?Intimate Partner Violence: Not on file  ? ? ?FAMILY HISTORY: ?Family History  ?Problem Relation Age of Onset  ? Hypercholesterolemia Mother   ? Hypothyroidism Sister   ? Hypercholesterolemia Sister   ? Breast cancer Maternal Aunt 80  ? Colon cancer Neg Hx   ? ? ?ALLERGIES:  is allergic to chocolate and sulfa antibiotics. ? ?MEDICATIONS:  ?Current Outpatient Medications  ?Medication Sig Dispense Refill  ? anastrozole (ARIMIDEX) 1 MG tablet Take 1 tablet (1 mg total) by mouth daily. 90 tablet 3  ? Calcium Citrate-Vitamin D (CALCIUM CITRATE + D PO) Take 2 tablets by mouth 2 (two) times daily.    ? cetirizine (ZYRTEC) 10 MG tablet Take 10 mg by mouth daily.    ? clindamycin-benzoyl peroxide (BENZACLIN) gel Apply 1 application topically 2 (two) times daily as needed (acne).    ? diphenhydrAMINE (BENADRYL) 25 MG tablet Take 25 mg by mouth at bedtime as needed for allergies.    ? doxycycline (VIBRAMYCIN) 100 MG capsule Take 100 mg by mouth daily as needed (acne).    ? Fiber POWD Take 1 Dose by mouth daily. 1 dose = 1 tablespoon    ? Glucosamine-Chondroitin (OSTEO BI-FLEX REGULAR STRENGTH PO) Take 1 tablet by mouth daily.    ? Multiple Vitamins-Minerals (CENTRUM SILVER ADULT 50+) TABS Take 1 tablet by mouth daily.    ? Omega-3  Fatty Acids (FISH OIL) 1000 MG CAPS Take 1,000 mg by mouth daily.    ? Probiotic Product (PROBIOTIC-10 PO) Take 1 capsule by mouth daily.    ? rosuvastatin (CRESTOR) 20 MG tablet Take 1 tablet (20 mg total) by mouth daily. 90 tablet 1  ? tretinoin (RETIN-A) 0.05 % cream Apply topically at bedtime as needed.    ? ?No current facility-administered medications for this visit.  ? ? ?  ?. ? ?PHYSICAL EXAMINATION: ?ECOG PERFORMANCE STATUS: 0 - Asymptomatic ? ?Vitals:  ? 03/21/22 1354  ?BP: 122/82  ?Pulse: 74  ?Temp: (!) 96.8 ?F (36 ?C)  ?SpO2: 100%  ? ? ?Filed Weights  ? 03/21/22 1354  ?Weight: 112 lb (50.8 kg)  ? ? ? ?Physical Exam ?HENT:  ?   Head: Normocephalic and atraumatic.  ?   Mouth/Throat:  ?   Pharynx: No oropharyngeal exudate.  ?Eyes:  ?   Pupils: Pupils are equal, round, and reactive to light.  ?Cardiovascular:  ?   Rate and Rhythm: Normal rate and regular rhythm.  ?  Pulmonary:  ?   Effort: Pulmonary effort is normal. No respiratory distress.  ?   Breath sounds: Normal breath sounds. No wheezing.  ?Abdominal:  ?   General: Bowel sounds are normal. There is no distension.  ?   Palpations: Abdomen is soft. There is no mass.  ?   Tenderness: There is no abdominal tenderness. There is no guarding or rebound.  ?Musculoskeletal:     ?   General: No tenderness. Normal range of motion.  ?   Cervical back: Normal range of motion and neck supple.  ?Skin: ?   General: Skin is warm.  ?Neurological:  ?   Mental Status: She is alert and oriented to person, place, and time.  ?Psychiatric:     ?   Mood and Affect: Affect normal.  ? ? ? ?LABORATORY DATA:  ?I have reviewed the data as listed ?Lab Results  ?Component Value Date  ? WBC 4.2 09/07/2021  ? HGB 14.4 09/07/2021  ? HCT 43.2 09/07/2021  ? MCV 95.7 09/07/2021  ? PLT 212.0 09/07/2021  ? ?Recent Labs  ?  09/07/21 ?7482 03/08/22 ?0805  ?NA 143 141  ?K 3.7 3.9  ?CL 103 103  ?CO2 31 31  ?GLUCOSE 100* 107*  ?BUN 15 21  ?CREATININE 0.77 0.79  ?CALCIUM 9.9 10.0  ?PROT 6.4 6.8   ?ALBUMIN 4.4 4.6  ?AST 13 17  ?ALT 19 25  ?ALKPHOS 25* 22*  ?BILITOT 1.0 1.0  ?BILIDIR 0.2 0.2  ? ? ?RADIOGRAPHIC STUDIES: ?I have personally reviewed the radiological images as listed and agreed with the findin

## 2022-03-21 NOTE — Progress Notes (Signed)
Radiation Oncology ?Follow up Note ? ?Name: Tracy Frey   ?Date:   03/21/2022 ?MRN:  [id number] ?DOB: 1951/07/11  ? ? ?This 71 y.o. female presents to the clinic today for 1 year follow-up status post whole breast radiation to her right breast for stage Ia ER/PR positive invasive mammary carcinoma. ? ?REFERRING PROVIDER: Einar Pheasant, MD ? ?HPI: Patient is a 71 year old female now at 1 year having completed whole breast radiation to her right breast for stage Ia ER/PR positive invasive mammary carcinoma.  Seen today in routine follow-up she is doing well she specifically denies breast tenderness cough or bone pain..  She mammograms back in January which I have reviewed were BI-RADS 2 benign.  She is currently on Arimidex tolerating well without side effect. ? ?COMPLICATIONS OF TREATMENT: none ? ?FOLLOW UP COMPLIANCE: keeps appointments  ? ?PHYSICAL EXAM:  ?BP (!) 141/57 (BP Location: Left Arm, Patient Position: Sitting)   Pulse 81   Temp (!) 97.2 ?F (36.2 ?C) (Tympanic)   Resp 16   Ht '5\' 4"'$  (1.626 m)   Wt 112 lb 3.2 oz (50.9 kg)   BMI 19.26 kg/m?  ?Lungs are clear to A&P cardiac examination essentially unremarkable with regular rate and rhythm. No dominant mass or nodularity is noted in either breast in 2 positions examined. Incision is well-healed. No axillary or supraclavicular adenopathy is appreciated. Cosmetic result is excellent.  Well-developed well-nourished patient in NAD. HEENT reveals PERLA, EOMI, discs not visualized.  Oral cavity is clear. No oral mucosal lesions are identified. Neck is clear without evidence of cervical or supraclavicular adenopathy. Lungs are clear to A&P. Cardiac examination is essentially unremarkable with regular rate and rhythm without murmur rub or thrill. Abdomen is benign with no organomegaly or masses noted. Motor sensory and DTR levels are equal and symmetric in the upper and lower extremities. Cranial nerves II through XII are grossly intact. Proprioception is  intact. No peripheral adenopathy or edema is identified. No motor or sensory levels are noted. Crude visual fields are within normal range. ? ?RADIOLOGY RESULTS: Mammograms reviewed compatible with above-stated findings ? ?PLAN: Present time patient is doing well with no evidence of disease 1 year out and pleased with her overall progress.  She continues on Arimidex without side effect.  I have asked to see her back in 1 year for follow-up.  Patient knows to call with any concerns. ? ?I would like to take this opportunity to thank you for allowing me to participate in the care of your patient.. ?  ? Noreene Filbert, MD ? ?

## 2022-03-21 NOTE — Assessment & Plan Note (Addendum)
#   Right breast cancer Stage IA ER/PR positive; her 2 NEG' Oncotype- low risk.  S/p RT [03/20/2021]; JAN 2023- Bil Mammo-BIL.on anastrazole; STABLE.  ? ?#Tolerating anastrozole fairly well without major side effects.  Continue anastrozole for total of 5 years [until spring 2027] ? ?# OSTEOPOROSIS-T score= - 4.0 on reclast  Q64m calcium from October 2022 9.5 normal.  Continue calcium plus vitamin D. ? ?# DISPOSITION: ?# Follow up in 6 months- MD; cbc/cmp; Reclast- Dr.B ? ?

## 2022-03-27 ENCOUNTER — Encounter: Payer: Self-pay | Admitting: Internal Medicine

## 2022-04-05 IMAGING — MG DIGITAL DIAGNOSTIC BILAT W/ TOMO W/ CAD
8 of 14 series · 9 of 40 positions shown · non-contrast
Comparison: Previous exam(s).

CLINICAL DATA: Status post RIGHT lumpectomy in January 2021

EXAM:
DIGITAL DIAGNOSTIC BILATERAL MAMMOGRAM WITH TOMOSYNTHESIS AND CAD
TECHNIQUE: Bilateral digital diagnostic mammography and breast tomosynthesis
was performed. The images were evaluated with computer-aided
detection.

[R MLO]
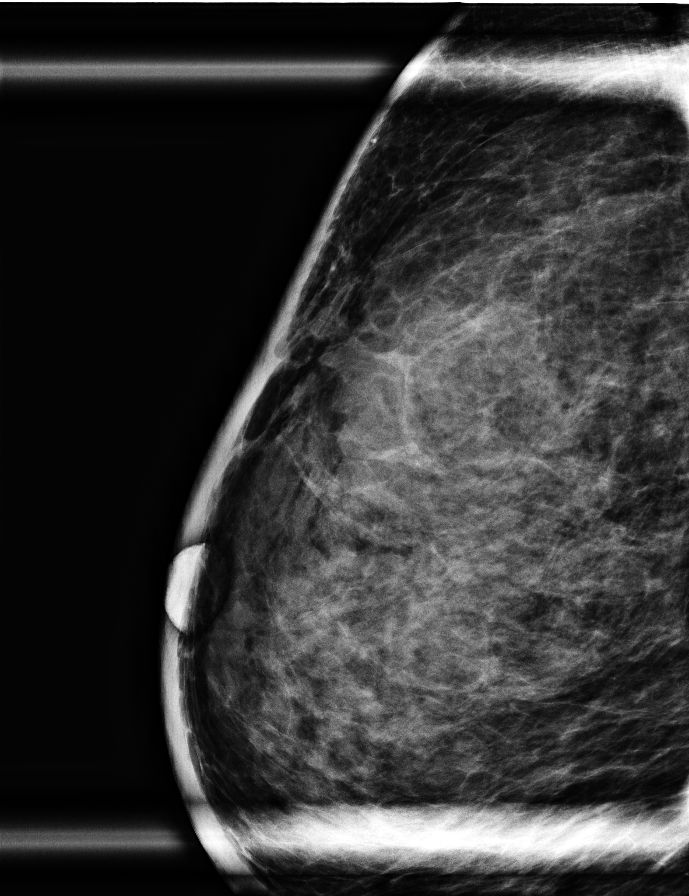

[L MLO synth-2D]
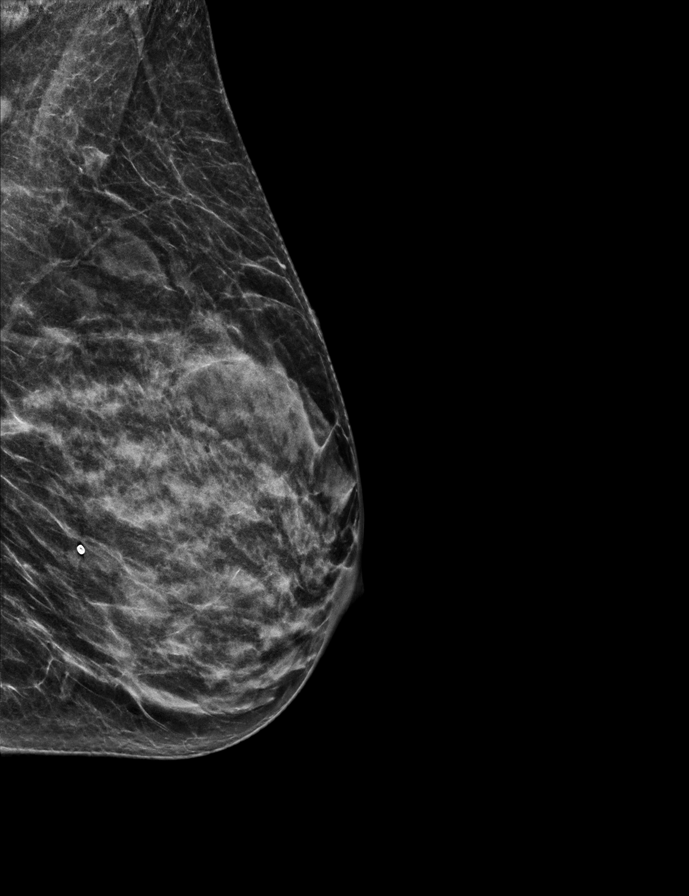

[R MLO synth-2D (1 of 2)]
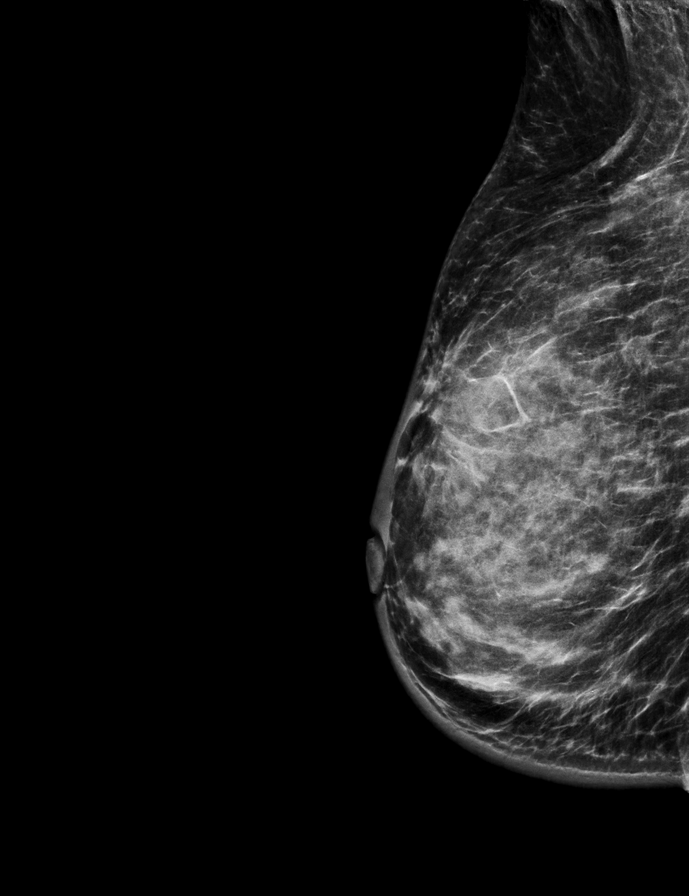

[R MLO synth-2D (2 of 2)]
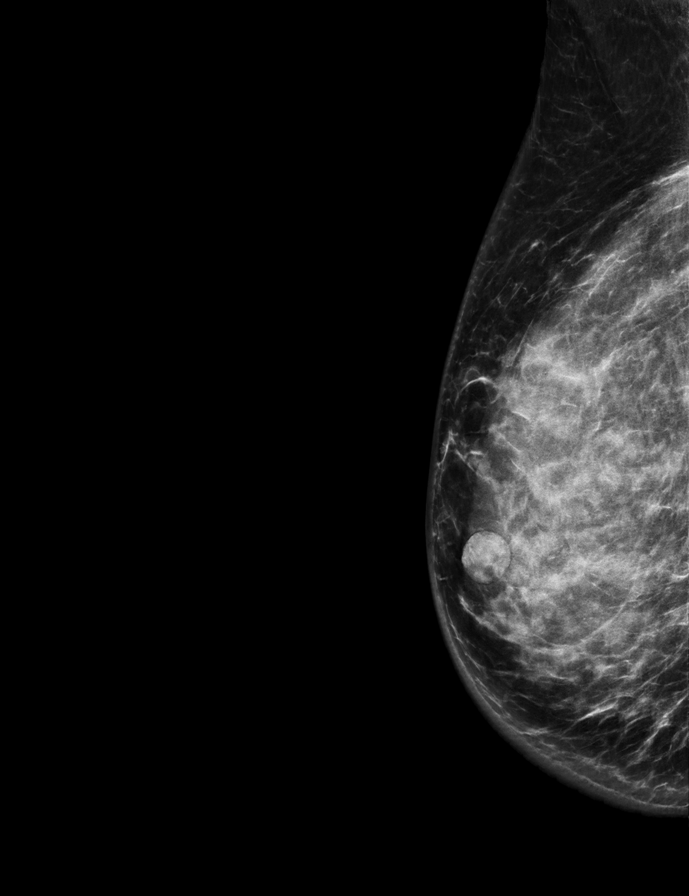

[L ML synth-2D]
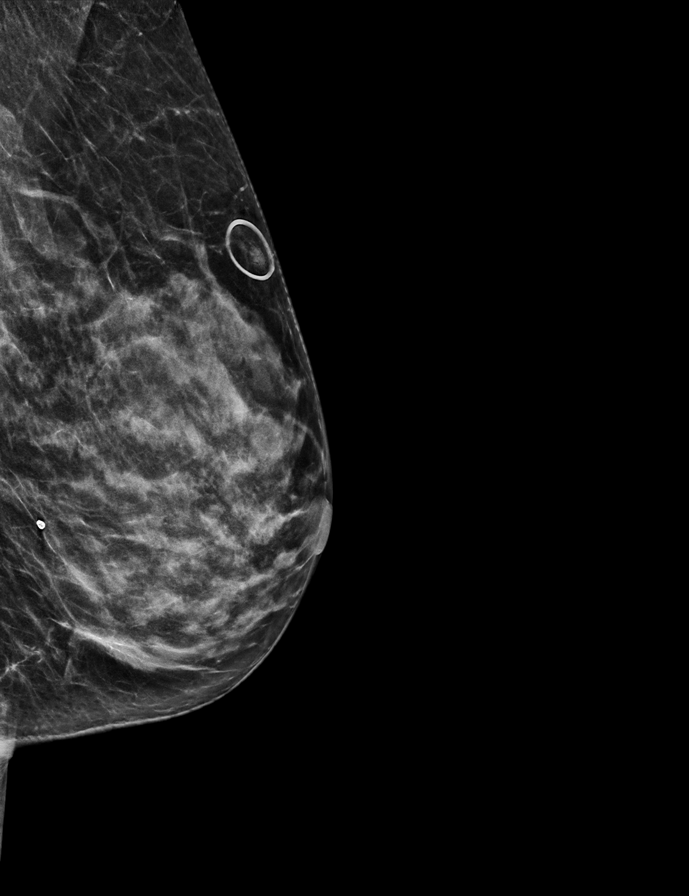

[L CC synth-2D]
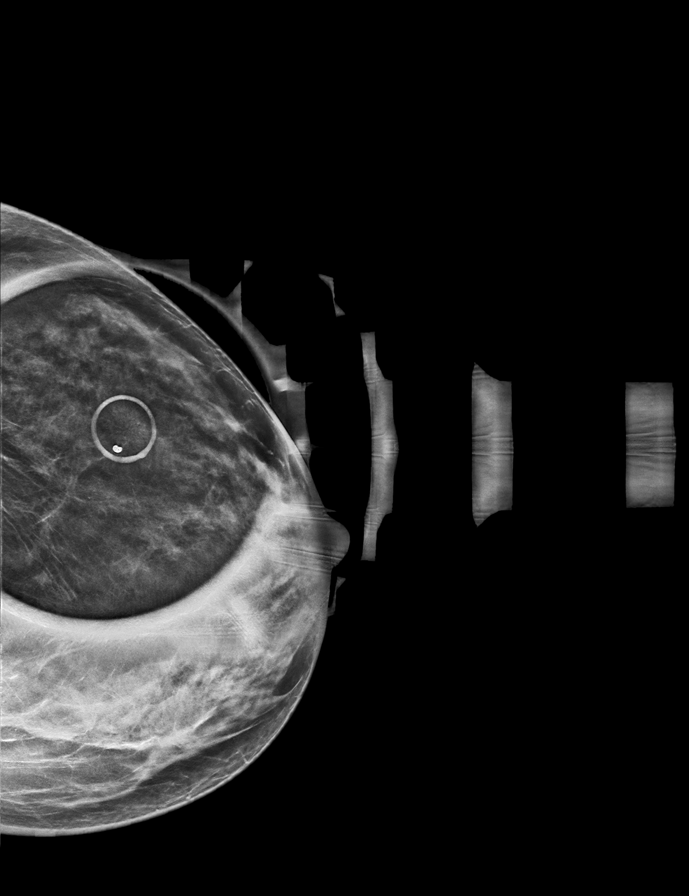

[R CC synth-2D]
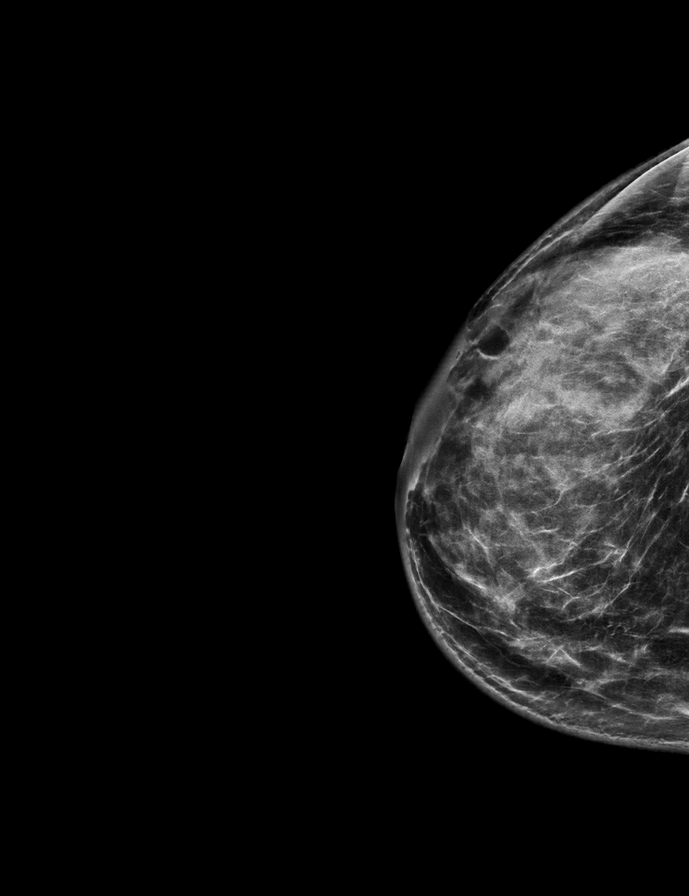

[R MLO tomo · 2 of 63 frames shown]
[frame 21/63]
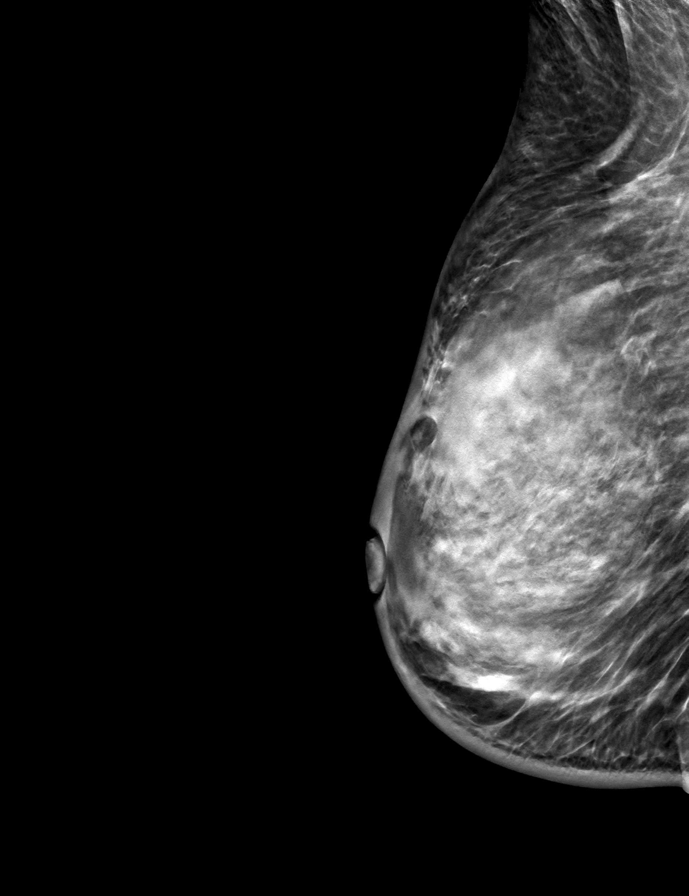
[frame 32/63]
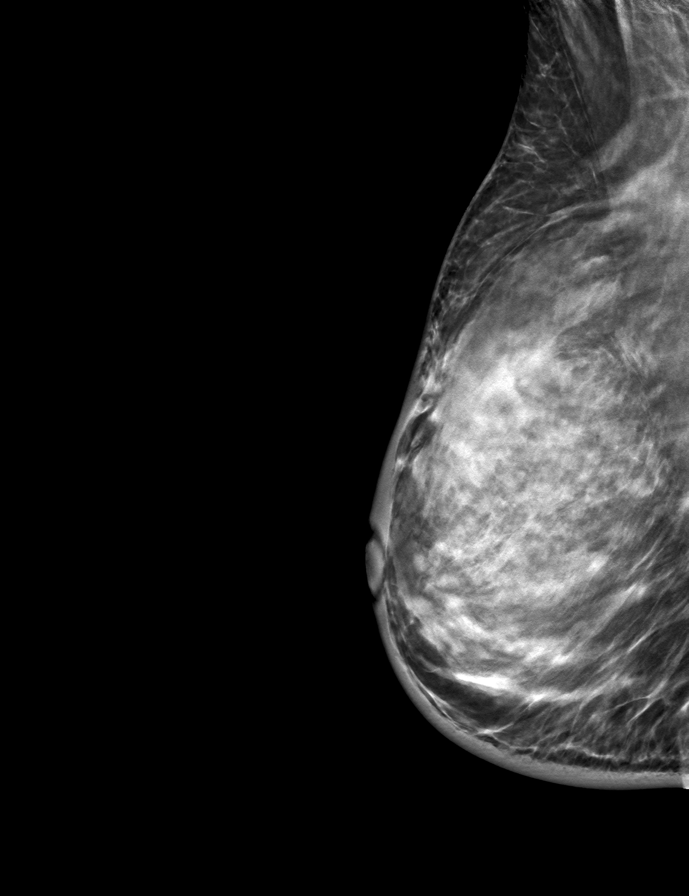

[9 of 40 positions shown; findings below may reference images not displayed]

ACR Breast Density Category c: The breast tissue is heterogeneously
dense, which may obscure small masses.
FINDINGS: There is density and architectural distortion within the RIGHT
breast, consistent with postsurgical changes. These are new in
comparison to prior, consistent with interval lumpectomy. A
questioned asymmetry in the LEFT breast resolves with additional
views, consistent with overlapping dense tissue. No suspicious mass,
distortion, or microcalcifications are identified to suggest
presence of malignancy.
IMPRESSION: No mammographic evidence of malignancy bilaterally.

RECOMMENDATION:
Recommend bilateral diagnostic mammogram in 1 year.

I have discussed the findings and recommendations with the patient.
If applicable, a reminder letter will be sent to the patient
regarding the next appointment.

BI-RADS CATEGORY  2: Benign.

## 2022-04-23 ENCOUNTER — Encounter: Payer: Self-pay | Admitting: Internal Medicine

## 2022-05-14 ENCOUNTER — Ambulatory Visit: Payer: PPO | Admitting: Internal Medicine

## 2022-08-30 DIAGNOSIS — L700 Acne vulgaris: Secondary | ICD-10-CM | POA: Diagnosis not present

## 2022-08-30 DIAGNOSIS — D2261 Melanocytic nevi of right upper limb, including shoulder: Secondary | ICD-10-CM | POA: Diagnosis not present

## 2022-08-30 DIAGNOSIS — D2262 Melanocytic nevi of left upper limb, including shoulder: Secondary | ICD-10-CM | POA: Diagnosis not present

## 2022-08-30 DIAGNOSIS — D2271 Melanocytic nevi of right lower limb, including hip: Secondary | ICD-10-CM | POA: Diagnosis not present

## 2022-08-30 DIAGNOSIS — L821 Other seborrheic keratosis: Secondary | ICD-10-CM | POA: Diagnosis not present

## 2022-08-30 DIAGNOSIS — D2272 Melanocytic nevi of left lower limb, including hip: Secondary | ICD-10-CM | POA: Diagnosis not present

## 2022-09-11 ENCOUNTER — Other Ambulatory Visit (INDEPENDENT_AMBULATORY_CARE_PROVIDER_SITE_OTHER): Payer: PPO

## 2022-09-11 DIAGNOSIS — E7800 Pure hypercholesterolemia, unspecified: Secondary | ICD-10-CM

## 2022-09-11 DIAGNOSIS — R739 Hyperglycemia, unspecified: Secondary | ICD-10-CM

## 2022-09-11 DIAGNOSIS — R030 Elevated blood-pressure reading, without diagnosis of hypertension: Secondary | ICD-10-CM

## 2022-09-11 LAB — HEPATIC FUNCTION PANEL
ALT: 24 U/L (ref 0–35)
AST: 14 U/L (ref 0–37)
Albumin: 4.3 g/dL (ref 3.5–5.2)
Alkaline Phosphatase: 26 U/L — ABNORMAL LOW (ref 39–117)
Bilirubin, Direct: 0.2 mg/dL (ref 0.0–0.3)
Total Bilirubin: 1.2 mg/dL (ref 0.2–1.2)
Total Protein: 6.5 g/dL (ref 6.0–8.3)

## 2022-09-11 LAB — BASIC METABOLIC PANEL
BUN: 15 mg/dL (ref 6–23)
CO2: 32 mEq/L (ref 19–32)
Calcium: 10.1 mg/dL (ref 8.4–10.5)
Chloride: 101 mEq/L (ref 96–112)
Creatinine, Ser: 0.77 mg/dL (ref 0.40–1.20)
GFR: 78.42 mL/min (ref >60.00)
Glucose, Bld: 115 mg/dL — ABNORMAL HIGH (ref 70–99)
Potassium: 3.8 mEq/L (ref 3.5–5.1)
Sodium: 141 mEq/L (ref 135–145)

## 2022-09-11 LAB — LIPID PANEL
Cholesterol: 171 mg/dL (ref 0–200)
HDL: 63.4 mg/dL (ref >39.00)
LDL Cholesterol: 96 mg/dL (ref 0–99)
NonHDL: 107.31
Total CHOL/HDL Ratio: 3
Triglycerides: 58 mg/dL (ref 0.0–149.0)
VLDL: 11.6 mg/dL (ref 0.0–40.0)

## 2022-09-11 LAB — HEMOGLOBIN A1C: Hgb A1c MFr Bld: 6 % (ref 4.6–6.5)

## 2022-09-12 ENCOUNTER — Other Ambulatory Visit: Payer: Self-pay | Admitting: Internal Medicine

## 2022-09-13 ENCOUNTER — Encounter: Payer: Self-pay | Admitting: Internal Medicine

## 2022-09-13 ENCOUNTER — Ambulatory Visit (INDEPENDENT_AMBULATORY_CARE_PROVIDER_SITE_OTHER): Payer: PPO | Admitting: Internal Medicine

## 2022-09-13 ENCOUNTER — Ambulatory Visit: Payer: PPO | Admitting: Internal Medicine

## 2022-09-13 VITALS — BP 159/91 | HR 74 | Temp 98.4°F | Ht 64.0 in | Wt 110.2 lb

## 2022-09-13 DIAGNOSIS — Z0000 Encounter for general adult medical examination without abnormal findings: Secondary | ICD-10-CM

## 2022-09-13 DIAGNOSIS — Z170 Estrogen receptor positive status [ER+]: Secondary | ICD-10-CM

## 2022-09-13 DIAGNOSIS — Z23 Encounter for immunization: Secondary | ICD-10-CM

## 2022-09-13 DIAGNOSIS — M810 Age-related osteoporosis without current pathological fracture: Secondary | ICD-10-CM

## 2022-09-13 DIAGNOSIS — E7800 Pure hypercholesterolemia, unspecified: Secondary | ICD-10-CM | POA: Diagnosis not present

## 2022-09-13 DIAGNOSIS — F439 Reaction to severe stress, unspecified: Secondary | ICD-10-CM

## 2022-09-13 DIAGNOSIS — C50411 Malignant neoplasm of upper-outer quadrant of right female breast: Secondary | ICD-10-CM

## 2022-09-13 DIAGNOSIS — R739 Hyperglycemia, unspecified: Secondary | ICD-10-CM

## 2022-09-13 NOTE — Assessment & Plan Note (Addendum)
Physical today 09/13/22.  Colonoscopy 06/2018.  Recommended f/u in 10 years.  Mammogram 12/14/21 - birads I.  Recommended f/u diagnostic mammogram in one year.

## 2022-09-13 NOTE — Progress Notes (Signed)
Patient ID: Tracy Frey, female   DOB: 1951/08/04, 71 y.o.   MRN: [id number]   Subjective:    Patient ID: Tracy Frey, female    DOB: 1951/03/31, 71 y.o.   MRN: [id number]   Patient here for  Chief Complaint  Patient presents with   Follow-up    Physical   .   HPI Reports she is doing relatively well.  Blood pressures doing well.  Outside checks reviewed - 120s/60-70s.  Seeing Dr Rogue Bussing for f/u breast cancer.  Tolerating anastrozole.  Plan to continue for total of 5 years (until spring 2027).  Receiving reclast for osteoporosis.  Is walking.  No chest pain or sob reported.  No abdominal pain.  Bowels moving.     Past Medical History:  Diagnosis Date   Hypercholesterolemia    Personal history of radiation therapy    Seasonal allergies    Past Surgical History:  Procedure Laterality Date   ABDOMINAL HYSTERECTOMY     BREAST EXCISIONAL BIOPSY Right 2003   neg   BREAST LUMPECTOMY Right    benign   BREAST LUMPECTOMY WITH SENTINEL LYMPH NODE BIOPSY Right 01/13/2021   Procedure: BREAST LUMPECTOMY WITH SENTINEL LYMPH NODE BX;  Surgeon: Robert Bellow, MD;  Location: [facility] ORS;  Service: General;  Laterality: Right;   COLONOSCOPY WITH PROPOFOL N/A 06/25/2018   Procedure: COLONOSCOPY WITH PROPOFOL;  Surgeon: Manya Silvas, MD;  Location: [facility] ENDOSCOPY;  Service: Endoscopy;  Laterality: N/A;   PARTIAL HYSTERECTOMY  1988   secondary to fibroids and heavy bleeding and endometriosis   TONSILLECTOMY     age 39   Family History  Problem Relation Age of Onset   Hypercholesterolemia Mother    Hypothyroidism Sister    Hypercholesterolemia Sister    Breast cancer Maternal Aunt 80   Colon cancer Neg Hx    Social History   Socioeconomic History   Marital status: Married    Spouse name: Not on file   Number of children: 2   Years of education: Not on file   Highest education level: Not on file  Occupational History    Employer: [employer]  Tobacco Use   Smoking status:  Never   Smokeless tobacco: Never  Vaping Use   Vaping Use: Never used  Substance and Sexual Activity   Alcohol use: Yes    Alcohol/week: 0.0 standard drinks of alcohol    Comment: ocassionally   Drug use: No   Sexual activity: Not on file  Other Topics Concern   Not on file  Social History Narrative   Lives in [place] with husband; 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    Social Determinants of Health   Financial Resource Strain: Not on file  Food Insecurity: Not on file  Transportation Needs: Not on file  Physical Activity: Not on file  Stress: Not on file  Social Connections: Not on file     Review of Systems  Constitutional:  Negative for appetite change and unexpected weight change.  HENT:  Negative for congestion, sinus pressure and sore throat.   Eyes:  Negative for pain and visual disturbance.  Respiratory:  Negative for cough, chest tightness and shortness of breath.   Cardiovascular:  Negative for chest pain, palpitations and leg swelling.  Gastrointestinal:  Negative for abdominal pain, diarrhea, nausea and vomiting.  Genitourinary:  Negative for difficulty urinating and dysuria.  Musculoskeletal:  Negative for joint swelling and myalgias.  Skin:  Negative for color change and rash.  Neurological:  Negative for dizziness, light-headedness and headaches.  Hematological:  Negative for adenopathy. Does not bruise/bleed easily.  Psychiatric/Behavioral:  Negative for agitation and dysphoric mood.        Objective:     BP (!) 159/91 (BP Location: Left Arm, Patient Position: Sitting, Cuff Size: Normal)   Pulse 74   Temp 98.4 F (36.9 C) (Oral)   Ht 5' 4" (1.626 m)   Wt 110 lb 3.2 oz (50 kg)   SpO2 98%   BMI 18.92 kg/m  Wt Readings from Last 3 Encounters:  09/13/22 110 lb 3.2 oz (50 kg)  03/21/22 112 lb 3.2 oz (50.9 kg)  03/21/22 112 lb (50.8 kg)    Physical Exam Vitals reviewed.  Constitutional:      General: She is not in acute  distress.    Appearance: Normal appearance. She is well-developed.  HENT:     Head: Normocephalic and atraumatic.     Right Ear: External ear normal.     Left Ear: External ear normal.  Eyes:     General: No scleral icterus.       Right eye: No discharge.        Left eye: No discharge.     Conjunctiva/sclera: Conjunctivae normal.  Neck:     Thyroid: No thyromegaly.  Cardiovascular:     Rate and Rhythm: Normal rate and regular rhythm.  Pulmonary:     Effort: No tachypnea, accessory muscle usage or respiratory distress.     Breath sounds: Normal breath sounds. No decreased breath sounds or wheezing.  Chest:  Breasts:    Right: No inverted nipple, mass, nipple discharge or tenderness (no axillary adenopathy).     Left: No inverted nipple, mass, nipple discharge or tenderness (no axilarry adenopathy).  Abdominal:     General: Bowel sounds are normal.     Palpations: Abdomen is soft.     Tenderness: There is no abdominal tenderness.  Musculoskeletal:        General: No swelling or tenderness.     Cervical back: Neck supple.  Lymphadenopathy:     Cervical: No cervical adenopathy.  Skin:    Findings: No erythema or rash.  Neurological:     Mental Status: She is alert and oriented to person, place, and time.  Psychiatric:        Mood and Affect: Mood normal.        Behavior: Behavior normal.      Outpatient Encounter Medications as of 09/13/2022  Medication Sig   anastrozole (ARIMIDEX) 1 MG tablet Take 1 tablet (1 mg total) by mouth daily.   Calcium Citrate-Vitamin D (CALCIUM CITRATE + D PO) Take 2 tablets by mouth 2 (two) times daily.   cetirizine (ZYRTEC) 10 MG tablet Take 10 mg by mouth daily.   clindamycin-benzoyl peroxide (BENZACLIN) gel Apply 1 application topically 2 (two) times daily as needed (acne).   diphenhydrAMINE (BENADRYL) 25 MG tablet Take 25 mg by mouth at bedtime as needed for allergies.   doxycycline (VIBRAMYCIN) 100 MG capsule Take 100 mg by mouth daily as  needed (acne).   Fiber POWD Take 1 Dose by mouth daily. 1 dose = 1 tablespoon   Glucosamine-Chondroitin (OSTEO BI-FLEX REGULAR STRENGTH PO) Take 1 tablet by mouth daily.   Multiple Vitamins-Minerals (CENTRUM SILVER ADULT 50+) TABS Take 1 tablet by mouth daily.   Omega-3 Fatty Acids (FISH OIL) 1000 MG CAPS Take 1,000 mg by mouth daily.   Probiotic Product (PROBIOTIC-10 PO) Take 1 capsule by  mouth daily.   rosuvastatin (CRESTOR) 20 MG tablet TAKE 1 TABLET BY MOUTH EVERY DAY   tretinoin (RETIN-A) 0.05 % cream Apply topically at bedtime as needed.   No facility-administered encounter medications on file as of 09/13/2022.     Lab Results  Component Value Date   WBC 4.2 09/07/2021   HGB 14.4 09/07/2021   HCT 43.2 09/07/2021   PLT 212.0 09/07/2021   GLUCOSE 115 (H) 09/11/2022   CHOL 171 09/11/2022   TRIG 58.0 09/11/2022   HDL 63.40 09/11/2022   LDLDIRECT 168.1 04/17/2013   LDLCALC 96 09/11/2022   ALT 24 09/11/2022   AST 14 09/11/2022   NA 141 09/11/2022   K 3.8 09/11/2022   CL 101 09/11/2022   CREATININE 0.77 09/11/2022   BUN 15 09/11/2022   CO2 32 09/11/2022   TSH 2.40 09/07/2021   HGBA1C 6.0 09/11/2022       Assessment & Plan:   Problem List Items Addressed This Visit     Breast cancer ([place])    On arimidex and tolerating.  Receiving reclast.  Mammogram as outlined.  Continue f/u with Dr Rogue Bussing.       Health care maintenance    Physical today 09/13/22.  Colonoscopy 06/2018.  Recommended f/u in 10 years.  Mammogram 12/14/21 - birads I.  Recommended f/u diagnostic mammogram in one year.       Hypercholesterolemia    Tolerating crestor.  Follow lipid panel and liver function tests.        Relevant Orders   Basic Metabolic Panel (BMET)   Hepatic function panel   Lipid panel   TSH   Hyperglycemia    Follow met b and a1c.       Relevant Orders   Hemoglobin A1c   Osteoporosis    Receiving reclast.  Continue calcium, vitamin D and weight bearing exercise.        Stress    Overall appears to be handling things relatively well.  Follow.       Other Visit Diagnoses     Routine general medical examination at a health care facility    -  Primary   Need for immunization against influenza       Relevant Orders   Flu Vaccine QUAD High Dose(Fluad) (Completed)        Einar Pheasant, MD

## 2022-09-17 ENCOUNTER — Telehealth: Payer: Self-pay | Admitting: *Deleted

## 2022-09-17 ENCOUNTER — Encounter: Payer: Self-pay | Admitting: Internal Medicine

## 2022-09-17 NOTE — Telephone Encounter (Signed)
Patient informed that she will need labs drawn as it must be within 7 days of infusion of Reclast. She is agreeable to this

## 2022-09-17 NOTE — Assessment & Plan Note (Signed)
Overall appears to be handling things relatively well.  Follow.   

## 2022-09-17 NOTE — Assessment & Plan Note (Signed)
On arimidex and tolerating.  Receiving reclast.  Mammogram as outlined.  Continue f/u with Dr Rogue Bussing.

## 2022-09-17 NOTE — Assessment & Plan Note (Signed)
Receiving reclast.  Continue calcium, vitamin D and weight bearing exercise.

## 2022-09-17 NOTE — Telephone Encounter (Signed)
Will need lab results within 7 days prior to Reclast.  We can cx the CBC order but will need BMP drawn.

## 2022-09-17 NOTE — Assessment & Plan Note (Signed)
Follow met b and a1c.  

## 2022-09-17 NOTE — Telephone Encounter (Signed)
Patient called asking if she needs to have labs drawn when she come in on the 18th for doctor/ and Reclast Her last CBC was drawn in 09/07/21 but had a BMP and Hepatic panel drawn on 09/11/22. Please advise  Basic metabolic panel Order: [id number] Status: Final result    Visible to patient: Yes (seen)    Next appt: 09/19/2022 at 01:00 PM in Oncology ([facility])    Dx: Elevated blood pressure reading; Hype...    1 Result Note           Component Ref Range & Units 6 d ago (09/11/22) 6 mo ago (03/08/22) 1 yr ago (09/07/21) 1 yr ago (02/13/21) 2 yr ago (07/28/20) 3 yr ago (09/03/19) 5 yr ago (03/06/17)  Sodium 135 - 145 mEq/L 141  141  143  141  140  142  141   Potassium 3.5 - 5.1 mEq/L 3.8  3.9  3.7  3.8  4.0  3.6  4.3   Chloride 96 - 112 mEq/L 101  103  103  103  102  103  102   CO2 19 - 32 mEq/L 32  31  31  32  30  30  33 High    Glucose, Bld 70 - 99 mg/dL 115 High   107 High   100 High   102 High   109 High   95  104 High    BUN 6 - 23 mg/dL '15  21  15  16  19  15  16   '$ Creatinine, Ser 0.40 - 1.20 mg/dL 0.77  0.79  0.77  0.77  0.79  0.77  0.84   GFR >60.00 mL/min 78.42  76.32 CM  78.98 CM  79.29 CM  72.41  74.79  72.45   Comment: Calculated using the CKD-EPI Creatinine Equation (2021)  Calcium 8.4 - 10.5 mg/dL 10.1  10.0  9.9  9.9  10.3  10.4  10.2   Resulting Agency  [place] [facility] [place] [facility] [place] [facility]         Specimen Collected: 09/11/22 07:36 Last Resulted: 09/11/22 10:35      Lab Flowsheet    Order Details    View Encounter    Lab and Collection Details    Routing    Result History    View All Conversations on this Encounter      CM=Additional comments      Result Care Coordination   Result Notes   Einar Pheasant, MD  09/12/2022  5:06 AM EDT Back to Top    Hold for 09/13/22 appt    Patient Communication   Add Comments   Seen Back to Top       Other Results from 09/11/2022  Lipid  panel Order: [id number] Status: Final result    Visible to patient: Yes (seen)    Next appt: 09/19/2022 at 01:00 PM in Oncology ([facility])    Dx: Hypercholesterolemia    1 Result Note           Component Ref Range & Units 6 d ago (09/11/22) 6 mo ago (03/08/22) 1 yr ago (09/07/21) 1 yr ago (02/13/21) 2 yr ago (07/28/20) 3 yr ago (09/03/19) 5 yr ago (03/06/17)  Cholesterol 0 - 200 mg/dL 171  190 CM  176 CM  188 CM  175 CM  254 High  CM  266 High  CM   Comment: ATP III Classification  Desirable:  < 200 mg/dL               Borderline High:  200 - 239 mg/dL          High:  > = 240 mg/dL  Triglycerides 0.0 - 149.0 mg/dL 58.0  83.0 CM  88.0 CM  81.0 CM  58.0 CM  82.0 CM  100.0 CM   Comment: Normal:  <150 mg/dLBorderline High:  150 - 199 mg/dL  HDL >39.00 mg/dL 63.40  66.00  60.30  63.70  61.20  61.40  58.30   VLDL 0.0 - 40.0 mg/dL 11.6  16.6  17.6  16.2  11.6  16.4  20.0   LDL Cholesterol 0 - 99 mg/dL 96  108 High   98  108 High   102 High   176 High   187 High    Total CHOL/HDL Ratio  3  3 CM  3 CM  3 CM  3 CM  4 CM  5 CM   Comment:                Men          Women1/2 Average Risk     3.4          3.3Average Risk          5.0          4.42X Average Risk          9.6          7.13X Average Risk          15.0          11.0                      NonHDL  107.31  124.20 CM  115.32 CM  124.57 CM  113.30 CM  192.47 CM  207.23 CM   Comment: NOTE:  Non-HDL goal should be 30 mg/dL higher than patient's LDL goal (i.e. LDL goal of < 70 mg/dL, would have non-HDL goal of < 100 mg/dL)  Resulting Agency  [place] [facility] [place]         Specimen Collected: 09/11/22 07:36 Last Resulted: 09/11/22 10:35      Lab Flowsheet    Order Details    View Encounter    Lab and Collection Details    Routing    Result History    View All Conversations on this Encounter      CM=Additional comments      Result Care  Coordination   Result Notes   Einar Pheasant, MD  09/12/2022  5:06 AM EDT Back to Top    Hold for 09/13/22 appt    Patient Communication   Add Comments   Seen Back to Top         Hemoglobin A1c Order: [id number] Status: Final result    Visible to patient: Yes (seen)    Next appt: 09/19/2022 at 01:00 PM in Oncology ([facility])    Dx: Hyperglycemia    1 Result Note           Component Ref Range & Units 6 d ago (09/11/22) 6 mo ago (03/08/22) 1 yr ago (09/07/21) 1 yr ago (02/13/21) 2 yr ago (07/28/20) 3 yr ago (09/03/19) 5 yr ago (03/06/17)  Hgb A1c MFr Bld 4.6 - 6.5 % 6.0  6.0 CM  6.0 CM  5.9 CM  6.0 CM  6.0 CM  5.9 CM   Comment: Glycemic Control Guidelines for People with Diabetes:Non Diabetic:  <6%Goal of Therapy: <7%Additional Action Suggested:  >8%   Resulting Agency  [place] [facility]         Specimen Collected: 09/11/22 07:36 Last Resulted: 09/11/22 09:37

## 2022-09-17 NOTE — Assessment & Plan Note (Signed)
Tolerating crestor.  Follow lipid panel and liver function tests.   

## 2022-09-19 ENCOUNTER — Inpatient Hospital Stay: Payer: PPO | Attending: Internal Medicine

## 2022-09-19 ENCOUNTER — Inpatient Hospital Stay: Payer: PPO

## 2022-09-19 ENCOUNTER — Inpatient Hospital Stay (HOSPITAL_BASED_OUTPATIENT_CLINIC_OR_DEPARTMENT_OTHER): Payer: PPO | Admitting: Internal Medicine

## 2022-09-19 ENCOUNTER — Encounter: Payer: Self-pay | Admitting: Internal Medicine

## 2022-09-19 DIAGNOSIS — Z803 Family history of malignant neoplasm of breast: Secondary | ICD-10-CM | POA: Insufficient documentation

## 2022-09-19 DIAGNOSIS — C50411 Malignant neoplasm of upper-outer quadrant of right female breast: Secondary | ICD-10-CM

## 2022-09-19 DIAGNOSIS — M810 Age-related osteoporosis without current pathological fracture: Secondary | ICD-10-CM

## 2022-09-19 DIAGNOSIS — Z923 Personal history of irradiation: Secondary | ICD-10-CM | POA: Diagnosis not present

## 2022-09-19 DIAGNOSIS — Z90710 Acquired absence of both cervix and uterus: Secondary | ICD-10-CM | POA: Insufficient documentation

## 2022-09-19 DIAGNOSIS — Z170 Estrogen receptor positive status [ER+]: Secondary | ICD-10-CM

## 2022-09-19 DIAGNOSIS — Z79811 Long term (current) use of aromatase inhibitors: Secondary | ICD-10-CM | POA: Diagnosis not present

## 2022-09-19 LAB — COMPREHENSIVE METABOLIC PANEL
ALT: 26 U/L (ref 0–44)
AST: 21 U/L (ref 15–41)
Albumin: 4.3 g/dL (ref 3.5–5.0)
Alkaline Phosphatase: 28 U/L — ABNORMAL LOW (ref 38–126)
Anion gap: 8 (ref 5–15)
BUN: 19 mg/dL (ref 8–23)
CO2: 29 mmol/L (ref 22–32)
Calcium: 9.6 mg/dL (ref 8.9–10.3)
Chloride: 99 mmol/L (ref 98–111)
Creatinine, Ser: 0.75 mg/dL (ref 0.44–1.00)
GFR, Estimated: >60 mL/min (ref >60)
Glucose, Bld: 198 mg/dL — ABNORMAL HIGH (ref 70–99)
Potassium: 3.7 mmol/L (ref 3.5–5.1)
Sodium: 136 mmol/L (ref 135–145)
Total Bilirubin: 1 mg/dL (ref 0.3–1.2)
Total Protein: 7 g/dL (ref 6.5–8.1)

## 2022-09-19 MED ORDER — ZOLEDRONIC ACID 5 MG/100ML IV SOLN
5.0000 mg | Freq: Once | INTRAVENOUS | Status: AC
  Administered 2022-09-19: 5 mg via INTRAVENOUS
  Filled 2022-09-19: qty 100

## 2022-09-19 MED ORDER — SODIUM CHLORIDE 0.9 % IV SOLN
Freq: Once | INTRAVENOUS | Status: AC
  Filled 2022-09-19: qty 250

## 2022-09-19 NOTE — Patient Instructions (Signed)
[facility] CANCER CTR AT [place]  Discharge Instructions: Thank you for choosing [facility] to provide your oncology and hematology care.  If you have a lab appointment with the [facility], please go directly to the [facility] and check in at the registration area.  Wear comfortable clothing and clothing appropriate for easy access to any Portacath or PICC line.   We strive to give you quality time with your provider. You may need to reschedule your appointment if you arrive late (15 or more minutes).  Arriving late affects you and other patients whose appointments are after yours.  Also, if you miss three or more appointments without notifying the office, you may be dismissed from the clinic at the provider's discretion.      For prescription refill requests, have your pharmacy contact our office and allow 72 hours for refills to be completed.    Today you received the following chemotherapy and/or immunotherapy agents zoledronic acid      To help prevent nausea and vomiting after your treatment, we encourage you to take your nausea medication as directed.  BELOW ARE SYMPTOMS THAT SHOULD BE REPORTED IMMEDIATELY: *FEVER GREATER THAN 100.4 F (38 C) OR HIGHER *CHILLS OR SWEATING *NAUSEA AND VOMITING THAT IS NOT CONTROLLED WITH YOUR NAUSEA MEDICATION *UNUSUAL SHORTNESS OF BREATH *UNUSUAL BRUISING OR BLEEDING *URINARY PROBLEMS (pain or burning when urinating, or frequent urination) *BOWEL PROBLEMS (unusual diarrhea, constipation, pain near the anus) TENDERNESS IN MOUTH AND THROAT WITH OR WITHOUT PRESENCE OF ULCERS (sore throat, sores in mouth, or a toothache) UNUSUAL RASH, SWELLING OR PAIN  UNUSUAL VAGINAL DISCHARGE OR ITCHING   Items with * indicate a potential emergency and should be followed up as soon as possible or go to the Emergency Department if any problems should occur.  Please show the CHEMOTHERAPY ALERT CARD or IMMUNOTHERAPY ALERT CARD at  check-in to the Emergency Department and triage nurse.  Should you have questions after your visit or need to cancel or reschedule your appointment, please contact [facility] [place] AT [place]  [phone] and follow the prompts.  Office hours are 8:00 a.m. to 4:30 p.m. Monday - Friday. Please note that voicemails left after 4:00 p.m. may not be returned until the following business day.  We are closed weekends and major holidays. You have access to a nurse at all times for urgent questions. Please call the main number to the clinic [phone] and follow the prompts.  For any non-urgent questions, you may also contact your provider using MyChart. We now offer e-Visits for anyone 59 and older to request care online for non-urgent symptoms. For details visit [email or web address].   Also download the MyChart app! Go to the app store, search "MyChart", open the app, select [facility], and log in with your MyChart username and password.  Masks are optional in the cancer centers. If you would like for your care team to wear a mask while they are taking care of you, please let them know. For doctor visits, patients may have with them one support person who is at least 70 years old. At this time, visitors are not allowed in the infusion area.

## 2022-09-19 NOTE — Progress Notes (Signed)
Patient here today for follow up regarding breast cancer, reclast infusion. Patient reports she is nervous about the reclast infusion given jaw pain on right side after last infusion. Patient states she woke up with jaw pain and had difficulty moving jaw for a while the morning after reclast infusion.

## 2022-09-19 NOTE — Assessment & Plan Note (Addendum)
#   Right breast cancer Stage IA ER/PR positive; her 2 NEG' Oncotype- low risk.  S/p RT [03/20/2021]; JAN 2023-[Dr.Byrnett/[facility]] Bil Mammo-BIL.on anastrazole; STABLE.   #Tolerating anastrozole fairly well without major side effects.  Continue anastrozole for total of 5 years [until spring 2027]  # BG- [place]unch- 198; monitor for now. Defer to PCP.   # OSTEOPOROSIS-T score= - 4.0 on reclast  Q12m[OCT 2022]. Continue calcium plus vitamin D. [dentist-Dr.Monahan].  Transient jaw pain after infusion in 2022.  No concern for any osteonecrosis of the jaw.  Suspect acute infusion reaction rather than osteonecrosis of jaw.  Discussed regarding Tylenol/Benadryl as needed postinfusion.  Reclast q 120m? Labs-pcp  # DISPOSITION: # Reclast today # Follow up in 6 months- MD; no labs-   Dr.B

## 2022-09-19 NOTE — Progress Notes (Signed)
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 [s/p TR- April, 18th, 2022]  # April mid -2022- START Anastrazole   # OSTEOPOROSIS:BMD- T score= -4.1 [Fosomax- Jan 2022]; 09/20/2021- Reclast    # SURVIVORSHIP:   # GENETICS: declined  DIAGNOSIS: Right breast cancer  STAGE:   I      ;  GOALS: cure  CURRENT/MOST RECENT THERAPY : RT/AI    Carcinoma of upper-outer quadrant of right breast in female, estrogen receptor positive ([place])  02/06/2021 Initial Diagnosis   Carcinoma of upper-outer quadrant of right breast in female, estrogen receptor positive ([place])   02/06/2021 Cancer Staging   Staging form: Breast, AJCC 8th Edition - Pathologic: Stage IA (pT1c, pN0, cM0, G1, ER+, PR+, HER2-) - Signed by Cammie Sickle, MD on 02/06/2021 Stage prefix: Initial diagnosis Histologic grading system: 3 grade system Menopausal status: Postmenopausal      HISTORY OF PRESENTING ILLNESS: Alone.  Ambulating independently.  Tracy Frey 71 y.o.  female stage I ER/PR positive HER2 negative breast cancer currently s/p adjuvant radiation-currently on anastrozole is here for follow-up.  October 2022 when patient had the first infusion-noted to have jaw discomfort that started the day after the infusion.  Lasted for few hours and then resolved.  Patient denies any worsening joint pains.   Denies any worsening hot flashes.  No shortness of breath or cough.   Review of Systems  Constitutional:  Negative for chills, diaphoresis, fever, malaise/fatigue and weight loss.  HENT:  Negative for nosebleeds and sore throat.   Eyes:  Negative for double vision.  Respiratory:  Negative for cough, hemoptysis, sputum production, shortness of breath and wheezing.   Cardiovascular:  Negative for chest pain, palpitations, orthopnea and leg swelling.  Gastrointestinal:  Negative for abdominal pain, blood in stool, constipation, diarrhea, heartburn, melena, nausea and vomiting.  Genitourinary:  Negative for dysuria, frequency and urgency.  Musculoskeletal:  Negative for back pain and joint pain.  Skin: Negative.  Negative for itching and rash.  Neurological:  Negative for dizziness, tingling, focal weakness, weakness and headaches.  Endo/Heme/Allergies:  Does not bruise/bleed easily.  Psychiatric/Behavioral:  Negative for depression. The patient is not nervous/anxious and does not have insomnia.      MEDICAL HISTORY:  Past Medical History:  Diagnosis Date   Hypercholesterolemia    Personal history of radiation therapy    Seasonal allergies     SURGICAL HISTORY: Past Surgical History:  Procedure Laterality Date   ABDOMINAL HYSTERECTOMY     BREAST EXCISIONAL BIOPSY Right 2003   neg   BREAST LUMPECTOMY Right    benign   BREAST LUMPECTOMY WITH SENTINEL LYMPH NODE BIOPSY Right 01/13/2021   Procedure: BREAST LUMPECTOMY WITH SENTINEL LYMPH NODE BX;  Surgeon: Robert Bellow, MD;  Location: [facility] ORS;  Service: General;  Laterality: Right;   COLONOSCOPY WITH PROPOFOL N/A 06/25/2018   Procedure: COLONOSCOPY WITH PROPOFOL;  Surgeon: Manya Silvas, MD;  Location: [facility] ENDOSCOPY;  Service: Endoscopy;  Laterality: N/A;   PARTIAL HYSTERECTOMY  1988   secondary to fibroids and heavy bleeding and endometriosis   TONSILLECTOMY     age 71    SOCIAL HISTORY: Social History   Socioeconomic  History   Marital status: Married    Spouse name: Not on file   Number of children: 2   Years of education: Not on file   Highest education level: Not on file  Occupational History    Employer: [employer]  Tobacco Use   Smoking status: Never   Smokeless tobacco: Never  Vaping Use   Vaping Use: Never used  Substance and Sexual Activity   Alcohol use: Yes    Alcohol/week: 0.0 standard drinks of alcohol    Comment: ocassionally   Drug use: No   Sexual activity: Not on file  Other Topics Concern   Not on file  Social History Narrative   Lives in [place] with husband; 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    Social Determinants of Health   Financial Resource Strain: Not on file  Food Insecurity: Not on file  Transportation Needs: Not on file  Physical Activity: Not on file  Stress: Not on file  Social Connections: Not on file  Intimate Partner Violence: Not on file    FAMILY HISTORY: Family History  Problem Relation Age of Onset   Hypercholesterolemia Mother    Hypothyroidism Sister    Hypercholesterolemia Sister    Breast cancer Maternal Aunt 80   Colon cancer Neg Hx     ALLERGIES:  is allergic to amoxicillin-pot clavulanate, chocolate, and sulfa antibiotics.  MEDICATIONS:  Current Outpatient Medications  Medication Sig Dispense Refill   anastrozole (ARIMIDEX) 1 MG tablet Take 1 tablet (1 mg total) by mouth daily. 90 tablet 3   Calcium Citrate-Vitamin D (CALCIUM CITRATE + D PO) Take 2 tablets by mouth 2 (two) times daily.     cetirizine (ZYRTEC) 10 MG tablet Take 10 mg by mouth daily.     clindamycin-benzoyl peroxide (BENZACLIN) gel Apply 1 application topically 2 (two) times daily as needed (acne).     diphenhydrAMINE (BENADRYL) 25 MG tablet Take 25 mg by mouth at bedtime as needed for allergies.     doxycycline (VIBRAMYCIN) 100 MG capsule Take 100 mg by mouth daily as needed (acne).     Fiber POWD Take 1 Dose by mouth daily. 1 dose = 1 tablespoon      Glucosamine-Chondroitin (OSTEO BI-FLEX REGULAR STRENGTH PO) Take 1 tablet by mouth daily.     Multiple Vitamins-Minerals (CENTRUM SILVER ADULT 50+) TABS Take 1 tablet by mouth daily.     Omega-3 Fatty Acids (FISH OIL) 1000 MG CAPS Take 1,000 mg by mouth daily.     Probiotic Product (PROBIOTIC-10 PO) Take 1 capsule by mouth daily.     rosuvastatin (CRESTOR) 20 MG tablet TAKE 1 TABLET BY MOUTH EVERY DAY 90 tablet 1   tretinoin (RETIN-A) 0.05 % cream Apply topically at bedtime as needed.     No current facility-administered medications for this visit.      Marland Kitchen  PHYSICAL EXAMINATION: ECOG PERFORMANCE STATUS: 0 - Asymptomatic  Vitals:   09/19/22 1330  BP: (!) 156/64  Pulse: 79  Resp: 18  Temp: (!) 97.1 F (36.2 C)    Filed Weights   09/19/22 1330  Weight: 111 lb 9.6 oz (50.6 kg)     Physical Exam HENT:     Head: Normocephalic and atraumatic.     Mouth/Throat:  Pharynx: No oropharyngeal exudate.  Eyes:     Pupils: Pupils are equal, round, and reactive to light.  Cardiovascular:     Rate and Rhythm: Normal rate and regular rhythm.  Pulmonary:     Effort: Pulmonary effort is normal. No respiratory distress.     Breath sounds: Normal breath sounds. No wheezing.  Abdominal:     General: Bowel sounds are normal. There is no distension.     Palpations: Abdomen is soft. There is no mass.     Tenderness: There is no abdominal tenderness. There is no guarding or rebound.  Musculoskeletal:        General: No tenderness. Normal range of motion.     Cervical back: Normal range of motion and neck supple.  Skin:    General: Skin is warm.  Neurological:     Mental Status: She is alert and oriented to person, place, and time.  Psychiatric:        Mood and Affect: Affect normal.      LABORATORY DATA:  I have reviewed the data as listed Lab Results  Component Value Date   WBC 4.2 09/07/2021   HGB 14.4 09/07/2021   HCT 43.2 09/07/2021   MCV 95.7 09/07/2021   PLT 212.0  09/07/2021   Recent Labs    03/08/22 0805 09/11/22 0736 09/19/22 1258  NA 141 141 136  K 3.9 3.8 3.7  CL 103 101 99  CO2 31 32 29  GLUCOSE 107* 115* 198*  BUN 21 15 19   CREATININE 0.79 0.77 0.75  CALCIUM 10.0 10.1 9.6  GFRNONAA  --   --  >60  PROT 6.8 6.5 7.0  ALBUMIN 4.6 4.3 4.3  AST 17 14 21   ALT 25 24 26   ALKPHOS 22* 26* 28*  BILITOT 1.0 1.2 1.0  BILIDIR 0.2 0.2  --     RADIOGRAPHIC STUDIES: I have personally reviewed the radiological images as listed and agreed with the findings in the report. No results found.  ASSESSMENT & PLAN:   Carcinoma of upper-outer quadrant of right breast in female, estrogen receptor positive ([place]) # Right breast cancer Stage IA ER/PR positive; her 2 NEG' Oncotype- low risk.  S/p RT [03/20/2021]; JAN 2023-[Dr.Byrnett/[place]] Bil Mammo-BIL.on anastrazole; STABLE.   #Tolerating anastrozole fairly well without major side effects.  Continue anastrozole for total of 5 years [until spring 2027]  # BG- [place]unch- 198; monitor for now. Defer to PCP.   # OSTEOPOROSIS-T score= - 4.0 on reclast  Q92m[OCT 2022]. Continue calcium plus vitamin D. [dentist-Dr.Monahan].  Transient jaw pain after infusion in 2022.  No concern for any osteonecrosis of the jaw.  Suspect acute infusion reaction rather than osteonecrosis of jaw.  Discussed regarding Tylenol/Benadryl as needed postinfusion.  Reclast q 163m? Labs-pcp  # DISPOSITION: # Reclast today # Follow up in 6 months- MD; no labs-   Dr.B   All questions were answered. The patient/family knows to call the clinic with any problems, questions or concerns.    GoCammie SickleMD 09/19/2022 1:58 PM

## 2022-11-08 ENCOUNTER — Other Ambulatory Visit: Payer: Self-pay | Admitting: Surgery

## 2022-11-08 DIAGNOSIS — Z853 Personal history of malignant neoplasm of breast: Secondary | ICD-10-CM

## 2022-11-21 ENCOUNTER — Other Ambulatory Visit: Payer: Self-pay | Admitting: Internal Medicine

## 2022-11-22 ENCOUNTER — Other Ambulatory Visit: Payer: Self-pay | Admitting: *Deleted

## 2022-11-22 ENCOUNTER — Encounter: Payer: Self-pay | Admitting: Internal Medicine

## 2022-11-22 NOTE — Telephone Encounter (Signed)
Please call Ms Yera and confirm she is doing ok.  Blood pressure 11/22/22 - overall improved when compared to the day prior.  Confirm no headache or other acute issues.  If any acute problems, needs to be evaluated. Also, if blood pressure remains elevated, needs to be seen

## 2022-11-23 ENCOUNTER — Other Ambulatory Visit: Payer: Self-pay | Admitting: *Deleted

## 2022-11-23 MED ORDER — ANASTROZOLE 1 MG PO TABS: 1.0000 mg | ORAL_TABLET | Freq: Every day | ORAL | 3 refills | Status: DC

## 2022-11-23 NOTE — Telephone Encounter (Signed)
S/w pt - stated feeling fine today. Sx resolved yesterday evening.  They are on their way to [facility] for Christmas, will continue to monitor.

## 2022-12-13 ENCOUNTER — Telehealth: Payer: Self-pay | Admitting: Internal Medicine

## 2022-12-13 NOTE — Telephone Encounter (Signed)
With the increased anxiety, varying blood pressures, etc - I would like for her to be schedule for an appt to discuss.  Please confirm no acute issues.  Schedule appt and can hold for work in appt.

## 2022-12-13 NOTE — Telephone Encounter (Signed)
Pt returning call

## 2022-12-13 NOTE — Telephone Encounter (Signed)
Lm for pt to cb.

## 2022-12-14 NOTE — Telephone Encounter (Signed)
Patient called back and has been scheduled to see Dr. Einar Pheasant on 12/18/2022.

## 2022-12-14 NOTE — Telephone Encounter (Signed)
PT SCHEDULED 12/18/22

## 2022-12-16 ENCOUNTER — Encounter: Payer: Self-pay | Admitting: Emergency Medicine

## 2022-12-16 ENCOUNTER — Emergency Department: Payer: PPO

## 2022-12-16 ENCOUNTER — Emergency Department
Admission: EM | Admit: 2022-12-16 | Discharge: 2022-12-16 | Disposition: A | Discharge status: Home / Self Care | Payer: PPO | Attending: Emergency Medicine | Admitting: Emergency Medicine

## 2022-12-16 ENCOUNTER — Other Ambulatory Visit: Payer: Self-pay

## 2022-12-16 DIAGNOSIS — I10 Essential (primary) hypertension: Secondary | ICD-10-CM | POA: Diagnosis not present

## 2022-12-16 DIAGNOSIS — R002 Palpitations: Secondary | ICD-10-CM | POA: Diagnosis not present

## 2022-12-16 DIAGNOSIS — I700 Atherosclerosis of aorta: Secondary | ICD-10-CM | POA: Diagnosis not present

## 2022-12-16 LAB — BASIC METABOLIC PANEL
Anion gap: 8 (ref 5–15)
BUN: 17 mg/dL (ref 8–23)
CO2: 30 mmol/L (ref 22–32)
Calcium: 10.3 mg/dL (ref 8.9–10.3)
Chloride: 101 mmol/L (ref 98–111)
Creatinine, Ser: 0.69 mg/dL (ref 0.44–1.00)
GFR, Estimated: >60 mL/min (ref >60)
Glucose, Bld: 141 mg/dL — ABNORMAL HIGH (ref 70–99)
Potassium: 4.2 mmol/L (ref 3.5–5.1)
Sodium: 139 mmol/L (ref 135–145)

## 2022-12-16 LAB — CBC
HCT: 46.6 % — ABNORMAL HIGH (ref 36.0–46.0)
Hemoglobin: 15.3 g/dL — ABNORMAL HIGH (ref 12.0–15.0)
MCH: 31.2 pg (ref 26.0–34.0)
MCHC: 32.8 g/dL (ref 30.0–36.0)
MCV: 94.9 fL (ref 80.0–100.0)
Platelets: 230 10*3/uL (ref 150–400)
RBC: 4.91 MIL/uL (ref 3.87–5.11)
RDW: 11.9 % (ref 11.5–15.5)
WBC: 6.9 10*3/uL (ref 4.0–10.5)
nRBC: 0 % (ref 0.0–0.2)

## 2022-12-16 LAB — HEPATIC FUNCTION PANEL
ALT: 28 U/L (ref 0–44)
AST: 20 U/L (ref 15–41)
Albumin: 4.6 g/dL (ref 3.5–5.0)
Alkaline Phosphatase: 21 U/L — ABNORMAL LOW (ref 38–126)
Bilirubin, Direct: 0.1 mg/dL (ref 0.0–0.2)
Indirect Bilirubin: 1.1 mg/dL — ABNORMAL HIGH (ref 0.3–0.9)
Total Bilirubin: 1.2 mg/dL (ref 0.3–1.2)
Total Protein: 7.3 g/dL (ref 6.5–8.1)

## 2022-12-16 LAB — TSH: TSH: 1.636 u[IU]/mL (ref 0.350–4.500)

## 2022-12-16 LAB — T4, FREE: Free T4: 1.04 ng/dL (ref 0.61–1.12)

## 2022-12-16 LAB — MAGNESIUM: Magnesium: 2.4 mg/dL (ref 1.7–2.4)

## 2022-12-16 LAB — TROPONIN I (HIGH SENSITIVITY): Troponin I (High Sensitivity): 11 ng/L (ref <18)

## 2022-12-16 MED ORDER — HYDROXYZINE HCL 25 MG PO TABS: 25.0000 mg | ORAL_TABLET | Freq: Three times a day (TID) | ORAL | 0 refills | Status: DC | PRN

## 2022-12-16 NOTE — ED Triage Notes (Signed)
Pt with fluttering in chest and HTN. Pt also states that these things make her feel anxious and she worries that the anxiety may be causing the fluttering and htn. Pt has been talking to PCP about it.

## 2022-12-16 NOTE — ED Provider Triage Note (Signed)
Emergency Medicine Provider Triage Evaluation Note  Tracy Frey , a 72 y.o. female  was evaluated in triage.  Pt complains of high blood pressure and feeling as if her heart is fluttering.  No previous history of atrial fibs.  Patient denies any difficulty or shortness of breath when walking.  Review of Systems  Positive: Rapid heartbeat.  Currently taking oral chemotherapy for breast cancer. Negative: Negative for chest pain, shortness of breath, difficulty breathing.  Physical Exam  BP (!) 175/109 (BP Location: Left Arm)   Pulse (!) 112   Temp 97.9 F (36.6 C) (Oral)   Resp 16   SpO2 99%  Gen:   Awake, no distress   Resp:  Normal effort clear bilaterally. MSK:   Moves extremities without difficulty  Other:  Heart rapid rate at 112 with regular rhythm.  Unable to ambulate without any assistance.  Medical Decision Making  Medically screening exam initiated at 3:26 PM.  Appropriate orders placed.  Tracy Frey was informed that the remainder of the evaluation will be completed by another provider, this initial triage assessment does not replace that evaluation, and the importance of remaining in the ED until their evaluation is complete.     Tracy Hai, PA-C 12/16/22 1528

## 2022-12-16 NOTE — ED Provider Notes (Signed)
[facility] Provider Note    Event Date/Time   First MD Initiated Contact with Patient 12/16/22 1806     (approximate)  History   Chief Complaint: Palpitations  HPI  Tracy Frey is a 72 y.o. female with a past medical history of past radiation to the chest, presents to the emergency department for palpitations and high blood pressure.  According to the patient she has had intermittent high blood pressure at her PCP office.  They told her to start checking her blood pressure multiple times throughout the day.  Patient has been checking it 3 times daily.  States it has been going up as high as 160 or so and then coming back down at times as low as 110.  Patient does admit it makes her very anxious and stressed with her blood pressure goes up.  Today the blood pressure went up and failed to come back down, patient was also experiencing intermittent palpitations in the chest that the patient came to the emergency department for evaluation.  Patient denies any pain in the chest.  No shortness of breath.  Physical Exam   Triage Vital Signs: ED Triage Vitals  Enc Vitals Group     BP 12/16/22 1523 (!) 175/109     Pulse Rate 12/16/22 1523 (!) 112     Resp 12/16/22 1523 16     Temp 12/16/22 1523 97.9 F (36.6 C)     Temp Source 12/16/22 1523 Oral     SpO2 12/16/22 1523 99 %     Weight 12/16/22 1544 105 lb (47.6 kg)     Height 12/16/22 1544 '5\' 4"'$  (1.626 m)     Head Circumference --      Peak Flow --      Pain Score 12/16/22 1544 0     Pain Loc --      Pain Edu? --      Excl. in [place]? --     Most recent vital signs: Vitals:   12/16/22 1930 12/16/22 1935  BP: (!) 145/84   Pulse: 80   Resp: 14   Temp:  98 F (36.7 C)  SpO2: 98%     General: Awake, no distress.  CV:  Good peripheral perfusion.  Regular rate and rhythm  Resp:  Normal effort.  Equal breath sounds bilaterally.  Abd:  No distention.  Soft, nontender.  No rebound or guarding.   ED Results  / Procedures / Treatments   EKG  EKG viewed and interpreted by myself shows a sinus rhythm 84 bpm with a slightly widened QRS, left axis deviation, largely normal intervals with nonspecific but no concerning ST changes.  RADIOLOGY  I have reviewed and interpreted chest x-ray images.  No consolidation seen on my evaluation. Radiology is read the chest x-ray is a subtle left apical hazy opacity.   MEDICATIONS ORDERED IN ED: Medications - No data to display   IMPRESSION / MDM / [place] / ED COURSE  I reviewed the triage vital signs and the nursing notes.  Patient's presentation is most consistent with acute presentation with potential threat to life or bodily function.  Patient presents emergency department for intermittent palpitations and high blood pressure.  Patient states she has been checking her blood pressure at home and it has been elevated 165 at times.  Patient states today the blood pressure was elevated not come back down.  She is also having palpitations so she came to the emergency department for  evaluation.  Patient denies any chest pain.  Patient's workup in the emergency department overall reassuring.  Blood pressure initially elevated to 347 systolic is now down to 425/95 without intervention.  Patient's workup in the emergency department shows reassuring results including a normal chemistry with normal renal function, normal CBC, normal TSH and T4, normal LFTs normal magnesium and reassuringly negative troponin.  Patient's EKG shows no significant findings does show some widening of the QRS that appears new from her last EKG but again negative cardiac enzymes and no chest pain.  Patient's chest x-ray does show a left apical hazy opacity.  Discussed this with the patient we decided jointly to proceed with CT scan to rule out mass or tumor.  CT scan essentially read as negative for acute disease and patchy subpleural postinfectious for scarring in the upper lobes  bilaterally no masses or tumors noted.  Given the patient's reassuring workup I did discuss PCP follow-up.  Patient is quite anxious regarding her blood pressure I did discuss a trial of hydroxyzine to be used if the patient is experiencing high blood pressure.  Otherwise she has a follow-up appoint with her PCP on Tuesday and will discuss her blood pressure and palpitations further.  Provided my typical palpitation and hypertension return precautions.  FINAL CLINICAL IMPRESSION(S) / ED DIAGNOSES   Palpitations Hypertension  Rx / DC Orders   Hydroxyzine  Note:  This document was prepared using Dragon voice recognition software and may include unintentional dictation errors.   Harvest Dark, MD 12/16/22 1942

## 2022-12-17 ENCOUNTER — Telehealth: Payer: Self-pay | Admitting: Internal Medicine

## 2022-12-17 NOTE — Telephone Encounter (Signed)
Pt called in staying that she has some test done yesterday, and she would like to Dr. Nicki Reaper to look over before/or while she's here tomorrow for her appt.

## 2022-12-18 ENCOUNTER — Ambulatory Visit (INDEPENDENT_AMBULATORY_CARE_PROVIDER_SITE_OTHER): Payer: PPO | Admitting: Internal Medicine

## 2022-12-18 ENCOUNTER — Encounter: Payer: Self-pay | Admitting: Internal Medicine

## 2022-12-18 VITALS — BP 142/80 | HR 111 | Temp 98.0°F | Resp 17 | Ht 64.0 in | Wt 106.7 lb

## 2022-12-18 DIAGNOSIS — R030 Elevated blood-pressure reading, without diagnosis of hypertension: Secondary | ICD-10-CM | POA: Diagnosis not present

## 2022-12-18 DIAGNOSIS — E7800 Pure hypercholesterolemia, unspecified: Secondary | ICD-10-CM

## 2022-12-18 DIAGNOSIS — C50411 Malignant neoplasm of upper-outer quadrant of right female breast: Secondary | ICD-10-CM | POA: Diagnosis not present

## 2022-12-18 DIAGNOSIS — F439 Reaction to severe stress, unspecified: Secondary | ICD-10-CM | POA: Diagnosis not present

## 2022-12-18 DIAGNOSIS — I447 Left bundle-branch block, unspecified: Secondary | ICD-10-CM | POA: Diagnosis not present

## 2022-12-18 DIAGNOSIS — R739 Hyperglycemia, unspecified: Secondary | ICD-10-CM

## 2022-12-18 DIAGNOSIS — Z170 Estrogen receptor positive status [ER+]: Secondary | ICD-10-CM | POA: Diagnosis not present

## 2022-12-18 NOTE — Telephone Encounter (Signed)
Will discuss at appt today.  

## 2022-12-18 NOTE — Progress Notes (Signed)
Subjective:    Patient ID: Tracy Frey, female    DOB: 08-02-1951, 72 y.o.   MRN: [id number]  Patient here for  Chief Complaint  Patient presents with   Medical Management of Chronic Issues   Hypertension   Anxiety    HPI Increased stress/anxiety. Was seen ER 12/16/22 - palpitations and elevated blood pressure.  Has been checking her blood pressure frequently.  Has been elevated.  Noticed intermittent palpitations.   ER w/up revealed normal chemistry with normal TSH and T4 with normal CBC. Negative cardiac enzymes.  CT scan no active disease in the chest.  Discharged to f/u here.  Continues to report increased stress and anxiety.  Discussed.  Discussed treatment options.  She was given rx for hydroxyzine in ER.  Breathing is stable overall.  No increased cough or congestion.  Eating.  No abdominal pain reported.     Past Medical History:  Diagnosis Date   Hypercholesterolemia    Personal history of radiation therapy    Seasonal allergies    Past Surgical History:  Procedure Laterality Date   ABDOMINAL HYSTERECTOMY     BREAST EXCISIONAL BIOPSY Right 2003   neg   BREAST LUMPECTOMY Right    benign   BREAST LUMPECTOMY WITH SENTINEL LYMPH NODE BIOPSY Right 01/13/2021   Procedure: BREAST LUMPECTOMY WITH SENTINEL LYMPH NODE BX;  Surgeon: Robert Bellow, MD;  Location: [facility] ORS;  Service: General;  Laterality: Right;   COLONOSCOPY WITH PROPOFOL N/A 06/25/2018   Procedure: COLONOSCOPY WITH PROPOFOL;  Surgeon: Manya Silvas, MD;  Location: [facility] ENDOSCOPY;  Service: Endoscopy;  Laterality: N/A;   PARTIAL HYSTERECTOMY  1988   secondary to fibroids and heavy bleeding and endometriosis   TONSILLECTOMY     age 75   Family History  Problem Relation Age of Onset   Hypercholesterolemia Mother    Hypothyroidism Sister    Hypercholesterolemia Sister    Breast cancer Maternal Aunt 80   Colon cancer Neg Hx    Social History   Socioeconomic History   Marital status: Married     Spouse name: Not on file   Number of children: 2   Years of education: Not on file   Highest education level: Not on file  Occupational History    Employer: [employer]  Tobacco Use   Smoking status: Never   Smokeless tobacco: Never  Vaping Use   Vaping Use: Never used  Substance and Sexual Activity   Alcohol use: Yes    Alcohol/week: 0.0 standard drinks of alcohol    Comment: ocassionally   Drug use: No   Sexual activity: Not on file  Other Topics Concern   Not on file  Social History Narrative   Lives in [place] with husband; 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    Social Determinants of Health   Financial Resource Strain: Not on file  Food Insecurity: Not on file  Transportation Needs: Not on file  Physical Activity: Not on file  Stress: Not on file  Social Connections: Not on file     Review of Systems  Constitutional:  Negative for appetite change and unexpected weight change.  HENT:  Negative for congestion and sinus pressure.   Respiratory:  Negative for cough, chest tightness and shortness of breath.   Cardiovascular:  Positive for palpitations. Negative for chest pain and leg swelling.  Gastrointestinal:  Negative for abdominal pain, diarrhea, nausea and vomiting.  Genitourinary:  Negative for difficulty urinating and dysuria.  Musculoskeletal:  Negative for joint swelling and myalgias.  Skin:  Negative for color change and rash.  Neurological:  Negative for dizziness and headaches.  Psychiatric/Behavioral:         Increased stress and anxiety.        Objective:     BP (!) 142/80   Pulse (!) 111   Temp 98 F (36.7 C) (Temporal)   Resp 17   Ht '5\' 4"'$  (1.626 m)   Wt 106 lb 11.2 oz (48.4 kg)   SpO2 98%   BMI 18.32 kg/m  Wt Readings from Last 3 Encounters:  12/18/22 106 lb 11.2 oz (48.4 kg)  12/16/22 105 lb (47.6 kg)  09/19/22 111 lb 9.6 oz (50.6 kg)    Physical Exam Vitals reviewed.  Constitutional:      General: She is not in  acute distress.    Appearance: Normal appearance.  HENT:     Head: Normocephalic and atraumatic.     Right Ear: External ear normal.     Left Ear: External ear normal.  Eyes:     General: No scleral icterus.       Right eye: No discharge.        Left eye: No discharge.     Conjunctiva/sclera: Conjunctivae normal.  Neck:     Thyroid: No thyromegaly.  Cardiovascular:     Rate and Rhythm: Normal rate and regular rhythm.  Pulmonary:     Effort: No respiratory distress.     Breath sounds: Normal breath sounds. No wheezing.  Abdominal:     General: Bowel sounds are normal.     Palpations: Abdomen is soft.     Tenderness: There is no abdominal tenderness.  Musculoskeletal:        General: No swelling or tenderness.     Cervical back: Neck supple. No tenderness.  Lymphadenopathy:     Cervical: No cervical adenopathy.  Skin:    Findings: No erythema or rash.  Neurological:     Mental Status: She is alert.  Psychiatric:        Mood and Affect: Mood normal.        Behavior: Behavior normal.      Outpatient Encounter Medications as of 12/18/2022  Medication Sig   anastrozole (ARIMIDEX) 1 MG tablet TAKE 1 TABLET BY MOUTH EVERY DAY   anastrozole (ARIMIDEX) 1 MG tablet Take 1 tablet (1 mg total) by mouth daily.   Calcium Citrate-Vitamin D (CALCIUM CITRATE + D PO) Take 2 tablets by mouth 2 (two) times daily.   cetirizine (ZYRTEC) 10 MG tablet Take 10 mg by mouth daily.   clindamycin-benzoyl peroxide (BENZACLIN) gel Apply 1 application topically 2 (two) times daily as needed (acne).   diphenhydrAMINE (BENADRYL) 25 MG tablet Take 25 mg by mouth at bedtime as needed for allergies.   doxycycline (VIBRAMYCIN) 100 MG capsule Take 100 mg by mouth daily as needed (acne).   Fiber POWD Take 1 Dose by mouth daily. 1 dose = 1 tablespoon   Glucosamine-Chondroitin (OSTEO BI-FLEX REGULAR STRENGTH PO) Take 1 tablet by mouth daily.   hydrOXYzine (ATARAX) 25 MG tablet Take 1 tablet (25 mg total) by  mouth 3 (three) times daily as needed for anxiety.   Multiple Vitamins-Minerals (CENTRUM SILVER ADULT 50+) TABS Take 1 tablet by mouth daily.   Omega-3 Fatty Acids (FISH OIL) 1000 MG CAPS Take 1,000 mg by mouth daily.   Probiotic Product (PROBIOTIC-10 PO) Take 1 capsule by mouth daily.   rosuvastatin (CRESTOR) 20 MG tablet TAKE  1 TABLET BY MOUTH EVERY DAY   tretinoin (RETIN-A) 0.05 % cream Apply topically at bedtime as needed.   No facility-administered encounter medications on file as of 12/18/2022.     Lab Results  Component Value Date   WBC 6.9 12/16/2022   HGB 15.3 (H) 12/16/2022   HCT 46.6 (H) 12/16/2022   PLT 230 12/16/2022   GLUCOSE 141 (H) 12/16/2022   CHOL 171 09/11/2022   TRIG 58.0 09/11/2022   HDL 63.40 09/11/2022   LDLDIRECT 168.1 04/17/2013   LDLCALC 96 09/11/2022   ALT 28 12/16/2022   AST 20 12/16/2022   NA 139 12/16/2022   K 4.2 12/16/2022   CL 101 12/16/2022   CREATININE 0.69 12/16/2022   BUN 17 12/16/2022   CO2 30 12/16/2022   TSH 1.636 12/16/2022   HGBA1C 6.0 09/11/2022    CT CHEST WO CONTRAST  Result Date: 12/16/2022 CLINICAL DATA:  Respiratory illness, nondiagnostic xray, palpitations, history of breast cancer. * Tracking Code: BO * EXAM: CT CHEST WITHOUT CONTRAST TECHNIQUE: Multidetector CT imaging of the chest was performed following the standard protocol without IV contrast. RADIATION DOSE REDUCTION: This exam was performed according to the departmental dose-optimization program which includes automated exposure control, adjustment of the mA and/or kV according to patient size and/or use of iterative reconstruction technique. COMPARISON:  Chest radiograph from earlier today. FINDINGS: Cardiovascular: Normal heart size. No significant pericardial effusion/thickening. Three-vessel coronary atherosclerosis. Atherosclerotic nonaneurysmal thoracic aorta. Normal caliber pulmonary arteries. Mediastinum/Nodes: No significant thyroid nodules. Unremarkable esophagus.  No pathologically enlarged axillary, mediastinal or hilar lymph nodes, noting limited sensitivity for the detection of hilar adenopathy on this noncontrast study. Lungs/Pleura: No pneumothorax. No pleural effusion. No acute consolidative airspace disease, lung masses or significant pulmonary nodules. Relatively symmetric mild patchy subpleural reticulation and mild bandlike subpleural consolidation in the posterior upper lobes bilaterally compatible with postinfectious versus senescent scarring. Minimal sharply marginated patchy subpleural reticulation and consolidation in the anterior mid to upper right lung compatible with minimal radiation fibrosis. Upper abdomen: No acute abnormality. Musculoskeletal: No aggressive appearing focal osseous lesions. Mild thoracic spondylosis. IMPRESSION: 1. No active disease in the chest. 2. Relatively symmetric mild patchy subpleural postinfectious versus senescent scarring in the posterior upper lungs bilaterally. 3. Minimal radiation fibrosis in the anterior mid to upper right lung. 4. Three-vessel coronary atherosclerosis. 5.  Aortic Atherosclerosis (ICD10-I70.0). Electronically Signed   By: Ilona Sorrel M.D.   On: 12/16/2022 19:03   DG Chest 2 View  Result Date: 12/16/2022 CLINICAL DATA:  Palpitations EXAM: CHEST - 2 VIEW COMPARISON:  None Available. FINDINGS: The cardiomediastinal silhouette is the upper limits of normal in contour. No pleural effusion. No pneumothorax. Subtle LEFT apical hazy opacity. Visualized abdomen is unremarkable. Multilevel degenerative changes of the thoracic spine. IMPRESSION: Subtle LEFT apical hazy opacity. This could reflect atelectasis or developing infection versus summation artifact. Recommend follow-up PA and lateral chest radiograph in 4-6 weeks to ensure resolution. Electronically Signed   By: Valentino Saxon M.D.   On: 12/16/2022 16:16       Assessment & Plan:  Elevated blood pressure reading Assessment & Plan: Blood  pressure elevated.  Feel related to increased stress and anxiety.  Discussed treatment options.  Has hydroxyzine to take prn.  Hold on medication.  Mammogram scheduled this week.  Have her spot check her pressure.  Send in readings.  If persistent elevation, will require medication  Orders: -     Ambulatory referral to Cardiology  Malignant neoplasm of upper-outer quadrant  of right breast in female, estrogen receptor positive ([place]) Assessment & Plan: On arimidex and tolerating.  Receiving reclast.  Continue f/u with Dr Rogue Bussing.    Hypercholesterolemia Assessment & Plan: Tolerating crestor.  Follow lipid panel and liver function tests.     Hyperglycemia Assessment & Plan: Follow met b and a1c.    Stress Assessment & Plan: Increased stress and anxiety as outlined.  Discussed treatment.  Has hydroxyzine to take prn.  Wants to hold on further medication. Follow.    Left bundle branch block Assessment & Plan: LBBB noted on EKG in ER.  New.  Reviewed CT. Three vessel coronary atherosclerosis.  Continue crestor.  Treat stress/anxiety. Discussed further cardiac w/up.  Agreeable.  Refer to cardiology.   Orders: -     Ambulatory referral to Cardiology     Einar Pheasant, MD

## 2022-12-19 ENCOUNTER — Ambulatory Visit
Admission: RE | Admit: 2022-12-19 | Discharge: 2022-12-19 | Disposition: A | Discharge status: Home / Self Care | Payer: PPO | Source: Ambulatory Visit | Attending: Surgery | Admitting: Surgery

## 2022-12-19 DIAGNOSIS — Z853 Personal history of malignant neoplasm of breast: Secondary | ICD-10-CM

## 2022-12-21 ENCOUNTER — Telehealth: Payer: Self-pay | Admitting: Internal Medicine

## 2022-12-21 NOTE — Telephone Encounter (Signed)
If she is wanting to start something for anxiety, we could try zoloft '25mg'$  q day.  Keep [place] posted on how doing.

## 2022-12-21 NOTE — Telephone Encounter (Signed)
Pt would like to be called in regards to the medication- hydrOXYzine

## 2022-12-21 NOTE — Telephone Encounter (Signed)
Spoke with pt and she stated that she is not sure that she wants to start a daily medication because she doesn't have anxiety everyday. Pt stated that she has the Hydroxyzine but the directions say take 1 tablet three times daily but she wants to know if she can take it as needed and take just a 1/2 tablet when she needs it. Spoke with Dr. Nicki Reaper and she stated that it is fine for pt to take 1/2 tablet daily as needed. Advised pt that if she finds that she is having to use it more often than not she would need to let us know so either a follow up could be schedule or a medication change could be made. Pt gave a verbal understanding.

## 2022-12-21 NOTE — Telephone Encounter (Signed)
Patient calling to let you know that she had to take one of her hydroxyzines yesterday and did not tolerate. Made her too groggy to function. Advised to stop taking and if she had to take one- do not drive. Patient stated she has been ok today but is agreeable to start BP medication and/or anxiety medication that was discussed in her visit this week.

## 2022-12-22 ENCOUNTER — Encounter: Payer: Self-pay | Admitting: Internal Medicine

## 2022-12-22 DIAGNOSIS — I447 Left bundle-branch block, unspecified: Secondary | ICD-10-CM | POA: Insufficient documentation

## 2022-12-22 NOTE — Assessment & Plan Note (Signed)
Blood pressure elevated.  Feel related to increased stress and anxiety.  Discussed treatment options.  Has hydroxyzine to take prn.  Hold on medication.  Mammogram scheduled this week.  Have her spot check her pressure.  Send in readings.  If persistent elevation, will require medication

## 2022-12-22 NOTE — Assessment & Plan Note (Signed)
LBBB noted on EKG in ER.  New.  Reviewed CT. Three vessel coronary atherosclerosis.  Continue crestor.  Treat stress/anxiety. Discussed further cardiac w/up.  Agreeable.  Refer to cardiology.

## 2022-12-22 NOTE — Assessment & Plan Note (Addendum)
On arimidex and tolerating.  Receiving reclast.  Continue f/u with Dr Rogue Bussing.

## 2022-12-22 NOTE — Assessment & Plan Note (Addendum)
Increased stress and anxiety as outlined.  Discussed treatment.  Has hydroxyzine to take prn.  Wants to hold on further medication. Follow.

## 2022-12-22 NOTE — Assessment & Plan Note (Signed)
Tolerating crestor.  Follow lipid panel and liver function tests.

## 2022-12-22 NOTE — Assessment & Plan Note (Signed)
Follow met b and a1c.  

## 2023-01-02 DIAGNOSIS — Z853 Personal history of malignant neoplasm of breast: Secondary | ICD-10-CM | POA: Diagnosis not present

## 2023-01-30 ENCOUNTER — Ambulatory Visit: Payer: PPO | Attending: Cardiovascular Disease | Admitting: Cardiovascular Disease

## 2023-01-30 ENCOUNTER — Encounter: Payer: Self-pay | Admitting: Cardiovascular Disease

## 2023-01-30 VITALS — BP 152/72 | HR 90 | Ht 64.0 in | Wt 102.5 lb

## 2023-01-30 DIAGNOSIS — R030 Elevated blood-pressure reading, without diagnosis of hypertension: Secondary | ICD-10-CM

## 2023-01-30 DIAGNOSIS — I2510 Atherosclerotic heart disease of native coronary artery without angina pectoris: Secondary | ICD-10-CM | POA: Diagnosis not present

## 2023-01-30 DIAGNOSIS — I447 Left bundle-branch block, unspecified: Secondary | ICD-10-CM | POA: Diagnosis not present

## 2023-01-30 DIAGNOSIS — F439 Reaction to severe stress, unspecified: Secondary | ICD-10-CM | POA: Diagnosis not present

## 2023-01-30 DIAGNOSIS — I25118 Atherosclerotic heart disease of native coronary artery with other forms of angina pectoris: Secondary | ICD-10-CM | POA: Diagnosis not present

## 2023-01-30 DIAGNOSIS — I700 Atherosclerosis of aorta: Secondary | ICD-10-CM

## 2023-01-30 MED ORDER — EZETIMIBE 10 MG PO TABS: 10.0000 mg | ORAL_TABLET | Freq: Every day | ORAL | 3 refills | Status: DC

## 2023-01-30 MED ORDER — IVABRADINE HCL 5 MG PO TABS: ORAL_TABLET | ORAL | 0 refills | Status: DC

## 2023-01-30 MED ORDER — METOPROLOL TARTRATE 100 MG PO TABS: ORAL_TABLET | ORAL | 0 refills | Status: DC

## 2023-01-30 NOTE — Progress Notes (Signed)
Cardiology Office Note  Date:  01/30/2023   ID:  Success, Delles 02/22/1951, MRN [id number]  PCP:  Einar Pheasant, MD   Chief Complaint  Patient presents with   New Patient (Initial Visit)    Patient was at [facility] with palpitations and elevated blood pressure on 12/26/22. Medications reviewed by the patient verbally.     HPI:  Ms. Tracy Frey is a 72 year old woman with past medical history Stress/anxiety Left bundle branch block Coronary calcification on CT, aortic atherosclerosis Who presents by referral from the emergency room for palpitations, aortic atherosclerosis  Seen in the ER December 16, 2022 palpitations Was anxious about blood pressure measurements at home At home, on day of evaluation, reports blood pressure went up and did not come back down Blood pressure in the ER 175/109 pulse 112 Repeat blood pressure 145/84 Was given hydroxyzine for anxiety New left bundle branch block noted on EKG compared to February 2022  CT scan chest December 16, 2022, images pulled up and reviewed Coronary calcification, mild Aortic atherosclerosis, mild  Lab work reviewed A1C 6.0 Total chol 171, LDL 96  EKG personally reviewed by myself on todays visit Normal sinus rhythm with rate 90 bpm left bundle branch block, PVCs  PMH:   has a past medical history of Hypercholesterolemia, Personal history of radiation therapy, and Seasonal allergies.  PSH:    Past Surgical History:  Procedure Laterality Date   ABDOMINAL HYSTERECTOMY     BREAST EXCISIONAL BIOPSY Right 2003   neg   BREAST LUMPECTOMY Right    benign   BREAST LUMPECTOMY WITH SENTINEL LYMPH NODE BIOPSY Right 01/13/2021   Procedure: BREAST LUMPECTOMY WITH SENTINEL LYMPH NODE BX;  Surgeon: Robert Bellow, MD;  Location: [facility] ORS;  Service: General;  Laterality: Right;   COLONOSCOPY WITH PROPOFOL N/A 06/25/2018   Procedure: COLONOSCOPY WITH PROPOFOL;  Surgeon: Manya Silvas, MD;  Location: [facility] ENDOSCOPY;  Service:  Endoscopy;  Laterality: N/A;   PARTIAL HYSTERECTOMY  1988   secondary to fibroids and heavy bleeding and endometriosis   TONSILLECTOMY     age 78    Current Outpatient Medications  Medication Sig Dispense Refill   anastrozole (ARIMIDEX) 1 MG tablet Take 1 tablet (1 mg total) by mouth daily. 90 tablet 3   Calcium Citrate-Vitamin D (CALCIUM CITRATE + D PO) Take 2 tablets by mouth 2 (two) times daily.     clindamycin-benzoyl peroxide (BENZACLIN) gel Apply 1 application topically 2 (two) times daily as needed (acne).     diphenhydrAMINE (BENADRYL) 25 MG tablet Take 25 mg by mouth at bedtime as needed for allergies.     ezetimibe (ZETIA) 10 MG tablet Take 1 tablet (10 mg total) by mouth daily. 90 tablet 3   Fiber POWD Take 1 Dose by mouth daily. 1 dose = 1 tablespoon     Glucosamine-Chondroitin (OSTEO BI-FLEX REGULAR STRENGTH PO) Take 1 tablet by mouth daily.     hydrOXYzine (ATARAX) 25 MG tablet Take 1 tablet (25 mg total) by mouth 3 (three) times daily as needed for anxiety. 20 tablet 0   ivabradine (CORLANOR) 5 MG TABS tablet Take 3 tablets by mouth once 2 hours before CTA. 3 tablet 0   metoprolol tartrate (LOPRESSOR) 100 MG tablet Take 1 tablet by mouth once 2 hours before CTA. 1 tablet 0   Multiple Vitamins-Minerals (CENTRUM SILVER ADULT 50+) TABS Take 1 tablet by mouth daily.     Omega-3 Fatty Acids (FISH OIL) 1000 MG CAPS Take 1,000 mg  by mouth daily.     Probiotic Product (PROBIOTIC-10 PO) Take 1 capsule by mouth daily.     rosuvastatin (CRESTOR) 20 MG tablet TAKE 1 TABLET BY MOUTH EVERY DAY 90 tablet 1   tretinoin (RETIN-A) 0.05 % cream Apply topically at bedtime as needed.     anastrozole (ARIMIDEX) 1 MG tablet TAKE 1 TABLET BY MOUTH EVERY DAY (Patient not taking: Reported on 01/30/2023) 90 tablet 3   cetirizine (ZYRTEC) 10 MG tablet Take 10 mg by mouth daily as needed for allergies. (Patient not taking: Reported on 01/30/2023)     doxycycline (VIBRAMYCIN) 100 MG capsule Take 100 mg by  mouth daily as needed (acne). (Patient not taking: Reported on 01/30/2023)     No current facility-administered medications for this visit.     Allergies:   Amoxicillin-pot clavulanate, Chocolate, and Sulfa antibiotics   Social History:  The patient  reports that she has never smoked. She has never used smokeless tobacco. She reports current alcohol use. She reports that she does not use drugs.   Family History:   family history includes Breast cancer (age of onset: 36) in her maternal aunt; Hypercholesterolemia in her mother and sister; Hypothyroidism in her sister.    Review of Systems: Review of Systems  Constitutional: Negative.   HENT: Negative.    Respiratory: Negative.    Cardiovascular: Negative.   Gastrointestinal: Negative.   Musculoskeletal: Negative.   Neurological: Negative.   Psychiatric/Behavioral:  The patient is nervous/anxious.   All other systems reviewed and are negative.    PHYSICAL EXAM: VS:  BP (!) 152/72 (BP Location: Right Arm, Patient Position: Sitting, Cuff Size: Normal)   Pulse 90   Ht '5\' 4"'$  (1.626 m)   Wt 102 lb 8 oz (46.5 kg)   SpO2 98%   BMI 17.59 kg/m  , BMI Body mass index is 17.59 kg/m. GEN: Well nourished, well developed, in no acute distress HEENT: normal Neck: no JVD, carotid bruits, or masses Cardiac: RRR; no murmurs, rubs, or gallops,no edema  Respiratory:  clear to auscultation bilaterally, normal work of breathing GI: soft, nontender, nondistended, + BS MS: no deformity or atrophy Skin: warm and dry, no rash Neuro:  Strength and sensation are intact Psych: euthymic mood, full affect   Recent Labs: 12/16/2022: ALT 28; BUN 17; Creatinine, Ser 0.69; Hemoglobin 15.3; Magnesium 2.4; Platelets 230; Potassium 4.2; Sodium 139; TSH 1.636    Lipid Panel Lab Results  Component Value Date   CHOL 171 09/11/2022   HDL 63.40 09/11/2022   LDLCALC 96 09/11/2022   TRIG 58.0 09/11/2022      Wt Readings from Last 3 Encounters:   01/30/23 102 lb 8 oz (46.5 kg)  12/18/22 106 lb 11.2 oz (48.4 kg)  12/16/22 105 lb (47.6 kg)      ASSESSMENT AND PLAN:  Problem List Items Addressed This Visit       Cardiology Problems   Atherosclerosis of aorta (HCC) - Primary   Relevant Medications   ezetimibe (ZETIA) 10 MG tablet   metoprolol tartrate (LOPRESSOR) 100 MG tablet   ivabradine (CORLANOR) 5 MG TABS tablet   Other Relevant Orders   EKG [id number]   Basic metabolic panel   Coronary artery calcification seen on CT scan   Relevant Medications   ezetimibe (ZETIA) 10 MG tablet   metoprolol tartrate (LOPRESSOR) 100 MG tablet   ivabradine (CORLANOR) 5 MG TABS tablet   Other Relevant Orders   EKG [id number]   Basic metabolic panel  Other   Stress   Relevant Orders   Basic metabolic panel   Elevated blood pressure reading   Relevant Orders   Basic metabolic panel   Other Visit Diagnoses     LBBB (left bundle branch block)       Relevant Medications   ezetimibe (ZETIA) 10 MG tablet   metoprolol tartrate (LOPRESSOR) 100 MG tablet   ivabradine (CORLANOR) 5 MG TABS tablet   Other Relevant Orders   Basic metabolic panel   CT CORONARY MORPH W/CTA COR W/SCORE W/CA W/CM &/OR WO/CM   Coronary artery disease of native artery of native heart with stable angina pectoris (HCC)       Relevant Medications   ezetimibe (ZETIA) 10 MG tablet   metoprolol tartrate (LOPRESSOR) 100 MG tablet   ivabradine (CORLANOR) 5 MG TABS tablet   Other Relevant Orders   Basic metabolic panel   CT CORONARY MORPH W/CTA COR W/SCORE W/CA W/CM &/OR WO/CM      Elevated blood pressure readings Numbers reviewed from home, typically with elevated numbers when she first sits down and checks her blood pressure with dramatic improvement in blood pressures after 10 to 15 minutes Often after rest blood pressure [id number] up to [id number] systolic, sometimes down less than 110, rarely less than [id number] systolic Recommend no additional medications at this time, would  work on anxiety Would not check blood pressure when she first sits down would wait 5 to 10 minutes before checking Agree with using half dose hydroxyzine for anxiety  Anxiety Managed by Dr. Nicki Reaper, currently using half dose hydroxyzine when she feels anxious If symptoms persist may need low-dose SSRI but she prefers not to be on medication on a regular basis Recommend regular walking program which she does  New left bundle branch block/angina New EKG finding since 2022 Underlying coronary artery disease on CT scan, three-vessel Images pulled up and reviewed We have recommended cardiac CTA to rule out obstructive coronary disease  Hyperlipidemia Recommend she add Zetia 10 mg daily with her Crestor 20   Total encounter time more than 60 minutes  Greater than 50% was spent in counseling and coordination of care with the patient    Signed, Esmond Plants, M.D., Ph.D. [place], [place]

## 2023-01-30 NOTE — Patient Instructions (Addendum)
Medication Instructions:   Your physician has recommended you make the following change in your medication:   Please start zetia 10 mg once a day  If you need a refill on your cardiac medications before your next appointment, please call your pharmacy.   Lab work:  Your physician recommends that you return for lab work in 2 weeks at [facility].  Monday-Friday  7:00 am- 6:00 pm   Testing/Procedures:    Your cardiac CT for CAD, LBBB will be scheduled at one of the below location on February 25, 2023 @ 2:30 pm .   [facility] [place], [place] [address] [phone]   If scheduled at [facility], please arrive 15 mins early for check-in and test prep.   Please follow these instructions carefully (unless otherwise directed):   On the Night Before the Test: Be sure to Drink plenty of water. Do not consume any caffeinated/decaffeinated beverages or chocolate 12 hours prior to your test. Do not take any antihistamines 12 hours prior to your test.   On the Day of the Test: Drink plenty of water until 1 hour prior to the test. Do not eat any food 1 hour prior to test. You may take your regular medications prior to the test.  Take metoprolol (Lopressor)/Corlanor two hours prior to test. FEMALES- please wear underwire-free bra if available, avoid dresses & tight clothing      After the Test: Drink plenty of water. After receiving IV contrast, you may experience a mild flushed feeling. This is normal. On occasion, you may experience a mild rash up to 24 hours after the test. This is not dangerous. If this occurs, you can take Benadryl 25 mg and increase your fluid intake. If you experience trouble breathing, this can be serious. If it is severe call 911 IMMEDIATELY. If it is mild, please call our office.    Follow-Up: At [facility], you and your health needs are our priority.  As part of our continuing  mission to provide you with exceptional heart care, we have created designated Provider Care Teams.  These Care Teams include your primary Cardiologist (physician) and Advanced Practice Providers (APPs -  Physician Assistants and Nurse Practitioners) who all work together to provide you with the care you need, when you need it.  You will need a follow up appointment as needed  Providers on your designated Care Team:   Murray Hodgkins, NP Christell Faith, PA-C Cadence Kathlen Mody, [place]  COVID-19 Vaccine Information can be found at: [email or web address] For questions related to vaccine distribution or appointments, please email [email or web address] or call [phone].

## 2023-02-09 ENCOUNTER — Other Ambulatory Visit: Payer: Self-pay | Admitting: Internal Medicine

## 2023-02-11 ENCOUNTER — Other Ambulatory Visit
Admission: RE | Admit: 2023-02-11 | Discharge: 2023-02-11 | Disposition: A | Discharge status: Home / Self Care | Payer: PPO | Source: Ambulatory Visit | Attending: Cardiovascular Disease | Admitting: Cardiovascular Disease

## 2023-02-11 DIAGNOSIS — I2510 Atherosclerotic heart disease of native coronary artery without angina pectoris: Secondary | ICD-10-CM | POA: Insufficient documentation

## 2023-02-11 DIAGNOSIS — I447 Left bundle-branch block, unspecified: Secondary | ICD-10-CM | POA: Diagnosis not present

## 2023-02-11 DIAGNOSIS — R030 Elevated blood-pressure reading, without diagnosis of hypertension: Secondary | ICD-10-CM

## 2023-02-11 DIAGNOSIS — I700 Atherosclerosis of aorta: Secondary | ICD-10-CM

## 2023-02-11 DIAGNOSIS — F439 Reaction to severe stress, unspecified: Secondary | ICD-10-CM | POA: Insufficient documentation

## 2023-02-11 DIAGNOSIS — I25118 Atherosclerotic heart disease of native coronary artery with other forms of angina pectoris: Secondary | ICD-10-CM | POA: Insufficient documentation

## 2023-02-11 LAB — BASIC METABOLIC PANEL
Anion gap: 10 (ref 5–15)
BUN: 16 mg/dL (ref 8–23)
CO2: 28 mmol/L (ref 22–32)
Calcium: 9.7 mg/dL (ref 8.9–10.3)
Chloride: 101 mmol/L (ref 98–111)
Creatinine, Ser: 0.8 mg/dL (ref 0.44–1.00)
GFR, Estimated: >60 mL/min (ref >60)
Glucose, Bld: 123 mg/dL — ABNORMAL HIGH (ref 70–99)
Potassium: 3.4 mmol/L — ABNORMAL LOW (ref 3.5–5.1)
Sodium: 139 mmol/L (ref 135–145)

## 2023-02-22 ENCOUNTER — Telehealth (HOSPITAL_COMMUNITY): Payer: Self-pay | Admitting: Emergency Medicine

## 2023-02-22 ENCOUNTER — Telehealth (HOSPITAL_COMMUNITY): Payer: Self-pay | Admitting: *Deleted

## 2023-02-22 NOTE — Telephone Encounter (Signed)
Patient returning call about her upcoming cardiac imaging study; pt verbalizes understanding of appt date/time, parking situation and where to check in, pre-test NPO status and medications ordered, and verified current allergies; name and call back number provided for further questions should they arise  Selma Rodelo RN Navigator Cardiac Imaging [facility] [phone] office [phone] cell  Patient to take 100mg metoprolol tartrate and 15mg ivabradine two hours prior to her cardiac CT scan. 

## 2023-02-22 NOTE — Telephone Encounter (Signed)
Attempted to call patient regarding upcoming cardiac CT appointment. °Left message on voicemail with name and callback number °Marcas Bowsher RN Navigator Cardiac Imaging °[facility] °[phone] Office °[phone] Cell ° °

## 2023-02-25 ENCOUNTER — Ambulatory Visit
Admission: RE | Admit: 2023-02-25 | Discharge: 2023-02-25 | Disposition: A | Discharge status: Home / Self Care | Payer: PPO | Source: Ambulatory Visit | Attending: Cardiovascular Disease | Admitting: Cardiovascular Disease

## 2023-02-25 DIAGNOSIS — I25118 Atherosclerotic heart disease of native coronary artery with other forms of angina pectoris: Secondary | ICD-10-CM

## 2023-02-25 DIAGNOSIS — I447 Left bundle-branch block, unspecified: Secondary | ICD-10-CM | POA: Insufficient documentation

## 2023-02-25 MED ORDER — IOHEXOL 350 MG/ML SOLN
100.0000 mL | Freq: Once | INTRAVENOUS | Status: AC | PRN
  Administered 2023-02-25: 80 mL via INTRAVENOUS

## 2023-02-25 MED ORDER — NITROGLYCERIN 0.4 MG SL SUBL
SUBLINGUAL_TABLET | SUBLINGUAL | Status: AC
  Filled 2023-02-25: qty 1

## 2023-02-25 MED ORDER — NITROGLYCERIN 0.4 MG SL SUBL
0.8000 mg | SUBLINGUAL_TABLET | Freq: Once | SUBLINGUAL | Status: AC
  Administered 2023-02-25: 0.8 mg via SUBLINGUAL

## 2023-02-25 NOTE — Progress Notes (Signed)
Patient tolerated procedure well. Ambulate w/o difficulty. Denies light headedness or being dizzy. Encouraged to drink extra water today and reasoning explained. Verbalized understanding. All questions answered. ABC intact. No further needs. Discharge from procedure area w/o issues.   

## 2023-03-07 ENCOUNTER — Telehealth: Payer: Self-pay | Admitting: Cardiovascular Disease

## 2023-03-07 NOTE — Telephone Encounter (Signed)
Patient's wife  is calling to verification of his results and to get them explained.

## 2023-03-07 NOTE — Telephone Encounter (Signed)
Spoke with patient and provided clarification of recent CTA results. Patient satisfied with response

## 2023-03-13 ENCOUNTER — Encounter: Payer: Self-pay | Admitting: Internal Medicine

## 2023-03-13 ENCOUNTER — Other Ambulatory Visit (INDEPENDENT_AMBULATORY_CARE_PROVIDER_SITE_OTHER): Payer: PPO

## 2023-03-13 DIAGNOSIS — E7800 Pure hypercholesterolemia, unspecified: Secondary | ICD-10-CM | POA: Diagnosis not present

## 2023-03-13 DIAGNOSIS — R739 Hyperglycemia, unspecified: Secondary | ICD-10-CM | POA: Diagnosis not present

## 2023-03-13 LAB — BASIC METABOLIC PANEL
BUN: 18 mg/dL (ref 6–23)
CO2: 33 mEq/L — ABNORMAL HIGH (ref 19–32)
Calcium: 10.1 mg/dL (ref 8.4–10.5)
Chloride: 101 mEq/L (ref 96–112)
Creatinine, Ser: 0.8 mg/dL (ref 0.40–1.20)
GFR: 74.64 mL/min (ref >60.00)
Glucose, Bld: 104 mg/dL — ABNORMAL HIGH (ref 70–99)
Potassium: 3.7 mEq/L (ref 3.5–5.1)
Sodium: 142 mEq/L (ref 135–145)

## 2023-03-13 LAB — HEPATIC FUNCTION PANEL
ALT: 33 U/L (ref 0–35)
AST: 20 U/L (ref 0–37)
Albumin: 4.4 g/dL (ref 3.5–5.2)
Alkaline Phosphatase: 20 U/L — ABNORMAL LOW (ref 39–117)
Bilirubin, Direct: 0.2 mg/dL (ref 0.0–0.3)
Total Bilirubin: 1.3 mg/dL — ABNORMAL HIGH (ref 0.2–1.2)
Total Protein: 6.5 g/dL (ref 6.0–8.3)

## 2023-03-13 LAB — LIPID PANEL
Cholesterol: 151 mg/dL (ref 0–200)
HDL: 65.6 mg/dL (ref >39.00)
LDL Cholesterol: 72 mg/dL (ref 0–99)
NonHDL: 85.46
Total CHOL/HDL Ratio: 2
Triglycerides: 65 mg/dL (ref 0.0–149.0)
VLDL: 13 mg/dL (ref 0.0–40.0)

## 2023-03-13 LAB — TSH: TSH: 2.2 u[IU]/mL (ref 0.35–5.50)

## 2023-03-13 LAB — HEMOGLOBIN A1C: Hgb A1c MFr Bld: 6 % (ref 4.6–6.5)

## 2023-03-15 ENCOUNTER — Ambulatory Visit (INDEPENDENT_AMBULATORY_CARE_PROVIDER_SITE_OTHER): Payer: PPO | Admitting: Internal Medicine

## 2023-03-15 ENCOUNTER — Encounter: Payer: Self-pay | Admitting: Internal Medicine

## 2023-03-15 DIAGNOSIS — C50411 Malignant neoplasm of upper-outer quadrant of right female breast: Secondary | ICD-10-CM | POA: Diagnosis not present

## 2023-03-15 DIAGNOSIS — R739 Hyperglycemia, unspecified: Secondary | ICD-10-CM

## 2023-03-15 DIAGNOSIS — M810 Age-related osteoporosis without current pathological fracture: Secondary | ICD-10-CM

## 2023-03-15 DIAGNOSIS — I447 Left bundle-branch block, unspecified: Secondary | ICD-10-CM

## 2023-03-15 DIAGNOSIS — F439 Reaction to severe stress, unspecified: Secondary | ICD-10-CM

## 2023-03-15 DIAGNOSIS — Z170 Estrogen receptor positive status [ER+]: Secondary | ICD-10-CM

## 2023-03-15 DIAGNOSIS — I700 Atherosclerosis of aorta: Secondary | ICD-10-CM

## 2023-03-15 DIAGNOSIS — E7800 Pure hypercholesterolemia, unspecified: Secondary | ICD-10-CM | POA: Diagnosis not present

## 2023-03-15 NOTE — Progress Notes (Unsigned)
Subjective:    Patient ID: Tracy Frey, female    DOB: Jan 29, 1951, 72 y.o.   MRN: [id number]  Patient here for  Chief Complaint  Patient presents with   Medical Management of Chronic Issues    HPI Here to follow up regarding her blood pressure and cholesterol.  She reports doing relatively well.  Tries to stay active.  No chest pain or sob reported. Saw Dr Mariah Milling 01/30/23 - f/u.  ER visit - 12/2022 - palpitations and elevated blood pressure.  Was given hydroxyzine for anxiety.  Uses a 1/4 prn.  CT scan chest December 16, 2022 - coronary calcification and mild aortic atherosclerosis. No changes made.  Eating.  No abdominal pain reported.  Discussed benefiber.  Reviewed outside blood pressures.  They do improve after resting only for a few minutes.     Past Medical History:  Diagnosis Date   Hypercholesterolemia    Personal history of radiation therapy    Seasonal allergies    Past Surgical History:  Procedure Laterality Date   ABDOMINAL HYSTERECTOMY     BREAST EXCISIONAL BIOPSY Right 2003   neg   BREAST LUMPECTOMY Right    benign   BREAST LUMPECTOMY WITH SENTINEL LYMPH NODE BIOPSY Right 01/13/2021   Procedure: BREAST LUMPECTOMY WITH SENTINEL LYMPH NODE BX;  Surgeon: Earline Mayotte, MD;  Location: [facility] ORS;  Service: General;  Laterality: Right;   COLONOSCOPY WITH PROPOFOL N/A 06/25/2018   Procedure: COLONOSCOPY WITH PROPOFOL;  Surgeon: Scot Jun, MD;  Location: [facility] ENDOSCOPY;  Service: Endoscopy;  Laterality: N/A;   PARTIAL HYSTERECTOMY  1988   secondary to fibroids and heavy bleeding and endometriosis   TONSILLECTOMY     age 32   Family History  Problem Relation Age of Onset   Hypercholesterolemia Mother    Hypothyroidism Sister    Hypercholesterolemia Sister    Breast cancer Maternal Aunt 31   Colon cancer Neg Hx    Social History   Socioeconomic History   Marital status: Married    Spouse name: Not on file   Number of children: 2   Years of education:  Not on file   Highest education level: Not on file  Occupational History    Employer: [employer]  Tobacco Use   Smoking status: Never   Smokeless tobacco: Never  Vaping Use   Vaping Use: Never used  Substance and Sexual Activity   Alcohol use: Yes    Alcohol/week: 0.0 standard drinks of alcohol    Comment: ocassionally   Drug use: No   Sexual activity: Not on file  Other Topics Concern   Not on file  Social History Narrative   Lives in [place] with husband; 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    Social Determinants of Health   Financial Resource Strain: Not on file  Food Insecurity: Not on file  Transportation Needs: Not on file  Physical Activity: Not on file  Stress: Not on file  Social Connections: Not on file     Review of Systems  Constitutional:  Negative for appetite change and unexpected weight change.  HENT:  Negative for congestion and sinus pressure.   Respiratory:  Negative for cough, chest tightness and shortness of breath.   Cardiovascular:  Negative for chest pain and palpitations.  Gastrointestinal:  Negative for abdominal pain, diarrhea, nausea and vomiting.  Genitourinary:  Negative for difficulty urinating and dysuria.  Musculoskeletal:  Negative for joint swelling and myalgias.  Skin:  Negative for  color change and rash.  Neurological:  Negative for dizziness and headaches.  Psychiatric/Behavioral:  Negative for agitation and dysphoric mood.        Objective:     BP 130/72   Pulse 87   Ht  (1.626 m)   Wt 103 lb 9.6 oz (47 kg)   SpO2 99%   BMI 17.78 kg/m  Wt Readings from Last 3 Encounters:  03/16/23 103 lb 9.6 oz (47 kg)  01/30/23 102 lb 8 oz (46.5 kg)  12/18/22 106 lb 11.2 oz (48.4 kg)    Physical Exam Vitals reviewed.  Constitutional:      General: She is not in acute distress.    Appearance: Normal appearance.  HENT:     Head: Normocephalic and atraumatic.     Right Ear: External ear normal.     Left  Ear: External ear normal.  Eyes:     General: No scleral icterus.       Right eye: No discharge.        Left eye: No discharge.     Conjunctiva/sclera: Conjunctivae normal.  Neck:     Thyroid: No thyromegaly.  Cardiovascular:     Rate and Rhythm: Normal rate and regular rhythm.  Pulmonary:     Effort: No respiratory distress.     Breath sounds: Normal breath sounds. No wheezing.  Abdominal:     General: Bowel sounds are normal.     Palpations: Abdomen is soft.     Tenderness: There is no abdominal tenderness.  Musculoskeletal:        General: No swelling or tenderness.     Cervical back: Neck supple. No tenderness.  Lymphadenopathy:     Cervical: No cervical adenopathy.  Skin:    Findings: No erythema or rash.  Neurological:     Mental Status: She is alert.  Psychiatric:        Mood and Affect: Mood normal.        Behavior: Behavior normal.      Outpatient Encounter Medications as of 03/15/2023  Medication Sig   anastrozole (ARIMIDEX) 1 MG tablet Take 1 tablet (1 mg total) by mouth daily.   Calcium Citrate-Vitamin D (CALCIUM CITRATE + D PO) Take 2 tablets by mouth 2 (two) times daily.   clindamycin-benzoyl peroxide (BENZACLIN) gel Apply 1 application topically 2 (two) times daily as needed (acne).   diphenhydrAMINE (BENADRYL) 25 MG tablet Take 25 mg by mouth at bedtime as needed for allergies.   ezetimibe (ZETIA) 10 MG tablet Take 1 tablet (10 mg total) by mouth daily.   Fiber POWD Take 1 Dose by mouth daily. 1 dose = 1 tablespoon   Glucosamine-Chondroitin (OSTEO BI-FLEX REGULAR STRENGTH PO) Take 1 tablet by mouth daily.   hydrOXYzine (ATARAX) 25 MG tablet Take 1 tablet (25 mg total) by mouth 3 (three) times daily as needed for anxiety.   Multiple Vitamins-Minerals (CENTRUM SILVER ADULT 50+) TABS Take 1 tablet by mouth daily.   Omega-3 Fatty Acids (FISH OIL) 1000 MG CAPS Take 1,000 mg by mouth daily.   Probiotic Product (PROBIOTIC-10 PO) Take 1 capsule by mouth daily.    rosuvastatin (CRESTOR) 20 MG tablet TAKE 1 TABLET BY MOUTH EVERY DAY   tretinoin (RETIN-A) 0.05 % cream Apply topically at bedtime as needed.   [DISCONTINUED] anastrozole (ARIMIDEX) 1 MG tablet TAKE 1 TABLET BY MOUTH EVERY DAY (Patient not taking: Reported on 01/30/2023)   [DISCONTINUED] cetirizine (ZYRTEC) 10 MG tablet Take 10 mg by mouth daily as  needed for allergies. (Patient not taking: Reported on 01/30/2023)   [DISCONTINUED] doxycycline (VIBRAMYCIN) 100 MG capsule Take 100 mg by mouth daily as needed (acne). (Patient not taking: Reported on 01/30/2023)   [DISCONTINUED] ivabradine (CORLANOR) 5 MG TABS tablet Take 3 tablets by mouth once 2 hours before CTA.   [DISCONTINUED] metoprolol tartrate (LOPRESSOR) 100 MG tablet Take 1 tablet by mouth once 2 hours before CTA.   No facility-administered encounter medications on file as of 03/15/2023.     Lab Results  Component Value Date   WBC 6.9 12/16/2022   HGB 15.3 (H) 12/16/2022   HCT 46.6 (H) 12/16/2022   PLT 230 12/16/2022   GLUCOSE 104 (H) 03/13/2023   CHOL 151 03/13/2023   TRIG 65.0 03/13/2023   HDL 65.60 03/13/2023   LDLDIRECT 168.1 04/17/2013   LDLCALC 72 03/13/2023   ALT 33 03/13/2023   AST 20 03/13/2023   NA 142 03/13/2023   K 3.7 03/13/2023   CL 101 03/13/2023   CREATININE 0.80 03/13/2023   BUN 18 03/13/2023   CO2 33 (H) 03/13/2023   TSH 2.20 03/13/2023   HGBA1C 6.0 03/13/2023    CT CORONARY MORPH W/CTA COR W/SCORE W/CA W/CM &/OR WO/CM  Addendum Date: 02/27/2023   ADDENDUM REPORT: 02/27/2023 23:50 EXAM: OVER-READ INTERPRETATION  CT CHEST The following report is an over-read performed by radiologist Dr. Carola Frost [facility] on 02/27/2023. This over-read does not include interpretation of cardiac or coronary anatomy or pathology. The coronary CTA interpretation by the cardiologist is attached. COMPARISON:  CT chest 09/16/2023 FINDINGS: The visualized lungs are clear. There is no lymphadenopathy in the lower  mediastinum. Visualized upper abdomen is unremarkable. No acute osseous findings. IMPRESSION: No acute extracardiac findings. Electronically Signed   By: Darliss Cheney M.D.   On: 02/27/2023 23:50   Result Date: 02/27/2023 CLINICAL DATA:  LBBB, coronary calcifications, palpitations EXAM: Cardiac/Coronary  CTA TECHNIQUE: The patient was scanned on a Siemens Somatom go.Top scanner. : A retrospective scan was triggered in the ascending thoracic aorta. Axial non-contrast 3 mm slices were carried out through the heart. The data set was analyzed on a dedicated work station and scored using the Agatson method. Gantry rotation speed was 330 msecs and collimation was .6 mm. 100mg  of metoprolol and 0.8 mg of sl NTG was given. The 3D data set was reconstructed in 5% intervals of the 60-95 % of the R-R cycle. Diastolic phases were analyzed on a dedicated work station using MPR, MIP and VRT modes. The patient received 100 cc of contrast. FINDINGS: Aorta: Normal size. Minimal aortic root and descending aorta calcifications. No dissection. Aortic Valve:  Trileaflet.  No calcifications. Coronary Arteries:  Normal coronary origin.  Right dominance. RCA is a dominant artery. There is no plaque. Left main gives rise to Ramus, LAD and LCX arteries. LM has no disease. Ramus has calcified plaque proximally causing minimal stenosis (<25%). LAD has calcified plaque proximally causing minimal stenosis (<25%). LCX is a non-dominant artery. There is calcified plaque proximally causing minimal stenosis (<25%). Other findings: Normal pulmonary vein drainage into the left atrium. Normal left atrial appendage without a thrombus. Normal size of the pulmonary artery. IMPRESSION: 1. Coronary calcium score of 44.6. This was 62nd percentile for age and sex matched control. 2. Normal coronary origin with right dominance. 3. Minimal LAD, LCx, Ramus stenosis (<25%). 4. CAD-RADS 1. Minimal non-obstructive CAD (0-24%). Consider non-atherosclerotic causes  of chest pain. Consider preventive therapy and risk factor modification. Electronically Signed: By: Arlys John  Agbor-Etang M.D. On: 02/25/2023 16:41       Assessment & Plan:  Hyperbilirubinemia Assessment & Plan: Slightly elevated - recent labs.  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  Recheck liver panel in  4 weeks.   Orders: -     Hepatic function panel; Future  Atherosclerosis of aorta Assessment & Plan: Continue crestor.     Malignant neoplasm of upper-outer quadrant of right breast in female, estrogen receptor positive Assessment & Plan: On arimidex and tolerating.  Receiving reclast.  Continue f/u with Dr Donneta Romberg.    Hypercholesterolemia Assessment & Plan: On crestor.  Also on zetia. Follow lipid panel and liver function tests.     Hyperglycemia Assessment & Plan: Follow met b and a1c.    Left bundle branch block Assessment & Plan: Saw cardiology 01/2023.  CTA - reviewed.  On crestor and zetia.  Follow.    Osteoporosis without current pathological fracture, unspecified osteoporosis type Assessment & Plan: Receiving reclast.  Reclast 09/19/22.  Continue calcium, vitamin D and weight bearing exercise.    Stress Assessment & Plan: Has hydroxyzine to take prn.   Follow.       Dale [place], MD

## 2023-03-16 ENCOUNTER — Encounter: Payer: Self-pay | Admitting: Internal Medicine

## 2023-03-16 NOTE — Assessment & Plan Note (Signed)
Continue crestor 

## 2023-03-16 NOTE — Assessment & Plan Note (Signed)
Has hydroxyzine to take prn.   Follow.

## 2023-03-16 NOTE — Assessment & Plan Note (Signed)
Slightly elevated - recent labs.  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  Recheck liver panel in  4 weeks.

## 2023-03-16 NOTE — Assessment & Plan Note (Signed)
On crestor.  Also on zetia. Follow lipid panel and liver function tests.

## 2023-03-16 NOTE — Assessment & Plan Note (Signed)
On arimidex and tolerating.  Receiving reclast.  Continue f/u with Dr Brahmanday.  

## 2023-03-16 NOTE — Assessment & Plan Note (Signed)
Saw cardiology 01/2023.  CTA - reviewed.  On crestor and zetia.  Follow.

## 2023-03-16 NOTE — Assessment & Plan Note (Signed)
Receiving reclast.  Reclast 09/19/22.  Continue calcium, vitamin D and weight bearing exercise.

## 2023-03-16 NOTE — Assessment & Plan Note (Signed)
Follow met b and a1c.  

## 2023-03-20 ENCOUNTER — Ambulatory Visit: Payer: PPO | Admitting: Internal Medicine

## 2023-03-27 ENCOUNTER — Encounter: Payer: Self-pay | Admitting: Radiation Oncology

## 2023-03-27 ENCOUNTER — Encounter: Payer: Self-pay | Admitting: Internal Medicine

## 2023-03-27 ENCOUNTER — Ambulatory Visit
Admission: RE | Admit: 2023-03-27 | Discharge: 2023-03-27 | Disposition: A | Discharge status: Home / Self Care | Payer: PPO | Source: Ambulatory Visit | Attending: Radiation Oncology | Admitting: Radiation Oncology

## 2023-03-27 ENCOUNTER — Inpatient Hospital Stay: Payer: PPO | Attending: Internal Medicine | Admitting: Internal Medicine

## 2023-03-27 VITALS — BP 144/70 | HR 84 | Temp 98.1°F | Resp 12 | Ht 64.0 in | Wt 103.0 lb

## 2023-03-27 VITALS — BP 140/70 | HR 84 | Temp 98.1°F | Ht 64.0 in | Wt 103.0 lb

## 2023-03-27 DIAGNOSIS — Z803 Family history of malignant neoplasm of breast: Secondary | ICD-10-CM | POA: Insufficient documentation

## 2023-03-27 DIAGNOSIS — I10 Essential (primary) hypertension: Secondary | ICD-10-CM | POA: Diagnosis not present

## 2023-03-27 DIAGNOSIS — C50411 Malignant neoplasm of upper-outer quadrant of right female breast: Secondary | ICD-10-CM | POA: Insufficient documentation

## 2023-03-27 DIAGNOSIS — Z923 Personal history of irradiation: Secondary | ICD-10-CM | POA: Insufficient documentation

## 2023-03-27 DIAGNOSIS — M810 Age-related osteoporosis without current pathological fracture: Secondary | ICD-10-CM | POA: Diagnosis not present

## 2023-03-27 DIAGNOSIS — Z79811 Long term (current) use of aromatase inhibitors: Secondary | ICD-10-CM | POA: Insufficient documentation

## 2023-03-27 DIAGNOSIS — Z170 Estrogen receptor positive status [ER+]: Secondary | ICD-10-CM | POA: Insufficient documentation

## 2023-03-27 DIAGNOSIS — R232 Flushing: Secondary | ICD-10-CM | POA: Diagnosis not present

## 2023-03-27 MED ORDER — ANASTROZOLE 1 MG PO TABS: 1.0000 mg | ORAL_TABLET | Freq: Every day | ORAL | 3 refills | Status: DC

## 2023-03-27 NOTE — Progress Notes (Signed)
Radiation Oncology Follow up Note  Name: Tracy Frey   Date:   03/27/2023 MRN:  [id number] DOB: 1951/07/21    This 72 y.o. female presents to the clinic today for 2-year follow-up status post whole breast radiation to right breast for stage Ia ER/PR positive invasive mammary carcinoma.  REFERRING PROVIDER: Dale [place], MD  HPI: Patient is a 72 year old female now 2 years having completed whole breast radiation to her right breast for stage Ia ER/PR positive invasive mammary carcinoma.  Seen today in routine follow-up she is doing well specifically denies breast tenderness cough or bone pain.  She has been having some heart issues palpitations some intermittent hypertension she is seeing cardiology for that..  She is currently on Arimidex tolerating that well without side effect.  She had mammograms back in January which I have reviewed were BI-RADS 2 benign.  She also had a cardiac coronary CT scan back in March showing no acute extracardiac findings.  COMPLICATIONS OF TREATMENT: none  FOLLOW UP COMPLIANCE: keeps appointments   PHYSICAL EXAM:  BP (!) 144/70 (BP Location: Left Arm, Patient Position: Sitting, Cuff Size: Small)   Pulse 84   Temp 98.1 F (36.7 C) (Tympanic)   Resp 12   Ht  (1.626 m) Comment: stated ht  Wt 103 lb (46.7 kg)   BMI 17.68 kg/m  Lungs are clear to A&P cardiac examination essentially unremarkable with regular rate and rhythm. No dominant mass or nodularity is noted in either breast in 2 positions examined. Incision is well-healed. No axillary or supraclavicular adenopathy is appreciated. Cosmetic result is excellent.  Well-developed well-nourished patient in NAD. HEENT reveals PERLA, EOMI, discs not visualized.  Oral cavity is clear. No oral mucosal lesions are identified. Neck is clear without evidence of cervical or supraclavicular adenopathy. Lungs are clear to A&P. Cardiac examination is essentially unremarkable with regular rate and rhythm without  murmur rub or thrill. Abdomen is benign with no organomegaly or masses noted. Motor sensory and DTR levels are equal and symmetric in the upper and lower extremities. Cranial nerves II through XII are grossly intact. Proprioception is intact. No peripheral adenopathy or edema is identified. No motor or sensory levels are noted. Crude visual fields are within normal range.  RADIOLOGY RESULTS: Mammogram and CT scan reviewed compatible with above-stated findings  PLAN: Present time patient continues to do well now 2 years out with no evidence of disease she is being followed by surgery as well as medical oncology.  I will turn follow-up care over to them.  Be happy to reevaluate the patient anytime the future should that be indicated.  She continues on Arimidex without side effect.  I would like to take this opportunity to thank you for allowing me to participate in the care of your patient.Carmina Miller, MD

## 2023-03-27 NOTE — Progress Notes (Signed)
No concerns today.  Needs refill of anastrozole.(Pended)

## 2023-03-27 NOTE — Progress Notes (Signed)
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 [s/p TR- April, 18th, 2022]  # April mid -2022- START Anastrazole   # OSTEOPOROSIS:BMD- T score= -4.1 [Fosomax- Jan 2022]; 09/20/2021- Reclast    # SURVIVORSHIP:   # GENETICS: declined  DIAGNOSIS: Right breast cancer  STAGE:   I      ;  GOALS: cure  CURRENT/MOST RECENT THERAPY : RT/AI    Carcinoma of upper-outer quadrant of right breast in female, estrogen receptor positive  02/06/2021 Initial Diagnosis   Carcinoma of upper-outer quadrant of right breast in female, estrogen receptor positive (HCC)   02/06/2021 Cancer Staging   Staging form: Breast, AJCC 8th Edition - Pathologic: Stage IA (pT1c, pN0, cM0, G1, ER+, PR+, HER2-) - Signed by Earna Coder, MD on 02/06/2021 Stage prefix: Initial diagnosis Histologic grading system: 3 grade system Menopausal status: Postmenopausal     HISTORY OF PRESENTING ILLNESS: Alone.  Ambulating independently.  Tracy Frey 72 y.o.  female stage I ER/PR positive HER2 negative breast cancer currently s/p adjuvant radiation-currently on anastrozole is here for follow-up.  No jaw discomfort after last reclast infusion.   Complains of hot flashes mild. Patient denies any worsening joint pains.  No shortness of breath or cough.  Review of Systems  Constitutional:  Negative for chills,  diaphoresis, fever, malaise/fatigue and weight loss.  HENT:  Negative for nosebleeds and sore throat.   Eyes:  Negative for double vision.  Respiratory:  Negative for cough, hemoptysis, sputum production, shortness of breath and wheezing.   Cardiovascular:  Negative for chest pain, palpitations, orthopnea and leg swelling.  Gastrointestinal:  Negative for abdominal pain, blood in stool, constipation, diarrhea, heartburn, melena, nausea and vomiting.  Genitourinary:  Negative for dysuria, frequency and urgency.  Musculoskeletal:  Negative for back pain and joint pain.  Skin: Negative.  Negative for itching and rash.  Neurological:  Negative for dizziness, tingling, focal weakness, weakness and headaches.  Endo/Heme/Allergies:  Does not bruise/bleed easily.  Psychiatric/Behavioral:  Negative for depression. The patient is not nervous/anxious and does not have insomnia.      MEDICAL HISTORY:  Past Medical History:  Diagnosis Date   Hypercholesterolemia    Personal history of radiation therapy    Seasonal allergies     SURGICAL HISTORY: Past Surgical History:  Procedure Laterality Date   ABDOMINAL HYSTERECTOMY     BREAST EXCISIONAL BIOPSY Right 2003   neg   BREAST LUMPECTOMY Right    benign   BREAST LUMPECTOMY WITH SENTINEL LYMPH NODE BIOPSY Right 01/13/2021   Procedure: BREAST LUMPECTOMY WITH SENTINEL LYMPH NODE BX;  Surgeon: Earline Mayotte, MD;  Location: [facility] ORS;  Service: General;  Laterality: Right;   COLONOSCOPY WITH PROPOFOL N/A 06/25/2018   Procedure: COLONOSCOPY WITH PROPOFOL;  Surgeon: Scot Jun, MD;  Location: [facility] ENDOSCOPY;  Service: Endoscopy;  Laterality: N/A;   PARTIAL HYSTERECTOMY  1988   secondary to fibroids and heavy bleeding and endometriosis   TONSILLECTOMY  age 30    SOCIAL HISTORY: Social History   Socioeconomic History   Marital status: Married    Spouse name: Not on file   Number of children: 2   Years of education: Not on file    Highest education level: Not on file  Occupational History    Employer: [employer]  Tobacco Use   Smoking status: Never   Smokeless tobacco: Never  Vaping Use   Vaping Use: Never used  Substance and Sexual Activity   Alcohol use: Yes    Alcohol/week: 0.0 standard drinks of alcohol    Comment: ocassionally   Drug use: No   Sexual activity: Not on file  Other Topics Concern   Not on file  Social History Narrative   Lives in [place] with husband; 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    Social Determinants of Health   Financial Resource Strain: Not on file  Food Insecurity: Not on file  Transportation Needs: Not on file  Physical Activity: Not on file  Stress: Not on file  Social Connections: Not on file  Intimate Partner Violence: Not on file    FAMILY HISTORY: Family History  Problem Relation Age of Onset   Hypercholesterolemia Mother    Hypothyroidism Sister    Hypercholesterolemia Sister    Breast cancer Maternal Aunt 5   Colon cancer Neg Hx     ALLERGIES:  is allergic to amoxicillin-pot clavulanate, chocolate, and sulfa antibiotics.  MEDICATIONS:  Current Outpatient Medications  Medication Sig Dispense Refill   Calcium Citrate-Vitamin D (CALCIUM CITRATE + D PO) Take 2 tablets by mouth 2 (two) times daily.     clindamycin-benzoyl peroxide (BENZACLIN) gel Apply 1 application topically 2 (two) times daily as needed (acne).     diphenhydrAMINE (BENADRYL) 25 MG tablet Take 25 mg by mouth at bedtime as needed for allergies.     ezetimibe (ZETIA) 10 MG tablet Take 1 tablet (10 mg total) by mouth daily. 90 tablet 3   Fiber POWD Take 1 Dose by mouth daily. 1 dose = 1 tablespoon     Glucosamine-Chondroitin (OSTEO BI-FLEX REGULAR STRENGTH PO) Take 1 tablet by mouth daily.     hydrOXYzine (ATARAX) 25 MG tablet Take 1 tablet (25 mg total) by mouth 3 (three) times daily as needed for anxiety. (Patient taking differently: Take 25 mg by mouth 3 (three) times  daily as needed for anxiety. Pt states she takes 1/4 tab prn) 20 tablet 0   Multiple Vitamins-Minerals (CENTRUM SILVER ADULT 50+) TABS Take 1 tablet by mouth daily.     Omega-3 Fatty Acids (FISH OIL) 1000 MG CAPS Take 1,000 mg by mouth daily.     Probiotic Product (PROBIOTIC-10 PO) Take 1 capsule by mouth daily.     rosuvastatin (CRESTOR) 20 MG tablet TAKE 1 TABLET BY MOUTH EVERY DAY 90 tablet 1   tretinoin (RETIN-A) 0.05 % cream Apply topically at bedtime as needed.     anastrozole (ARIMIDEX) 1 MG tablet Take 1 tablet (1 mg total) by mouth daily. 90 tablet 3   No current facility-administered medications for this visit.      Marland Kitchen  PHYSICAL EXAMINATION: ECOG PERFORMANCE STATUS: 0 - Asymptomatic  Vitals:   03/27/23 1438  BP: (!) 140/70  Pulse: 84  Temp: 98.1 F (36.7 C)    Filed Weights   03/27/23 1438  Weight: 103 lb (46.7 kg)     Physical Exam HENT:     Head: Normocephalic and atraumatic.  Mouth/Throat:     Pharynx: No oropharyngeal exudate.  Eyes:     Pupils: Pupils are equal, round, and reactive to light.  Cardiovascular:     Rate and Rhythm: Normal rate and regular rhythm.  Pulmonary:     Effort: Pulmonary effort is normal. No respiratory distress.     Breath sounds: Normal breath sounds. No wheezing.  Abdominal:     General: Bowel sounds are normal. There is no distension.     Palpations: Abdomen is soft. There is no mass.     Tenderness: There is no abdominal tenderness. There is no guarding or rebound.  Musculoskeletal:        General: No tenderness. Normal range of motion.     Cervical back: Normal range of motion and neck supple.  Skin:    General: Skin is warm.  Neurological:     Mental Status: She is alert and oriented to person, place, and time.  Psychiatric:        Mood and Affect: Affect normal.      LABORATORY DATA:  I have reviewed the data as listed Lab Results  Component Value Date   WBC 6.9 12/16/2022   HGB 15.3 (H) 12/16/2022   HCT  46.6 (H) 12/16/2022   MCV 94.9 12/16/2022   PLT 230 12/16/2022   Recent Labs    09/11/22 0736 09/19/22 1258 12/16/22 1549 02/11/23 0824 03/13/23 0734  NA 141 136 139 139 142  K 3.8 3.7 4.2 3.4* 3.7  CL 101 99 101 101 101  CO2 32 29 30 28  33*  GLUCOSE 115* 198* 141* 123* 104*  BUN 15 19 17 16 18   CREATININE 0.77 0.75 0.69 0.80 0.80  CALCIUM 10.1 9.6 10.3 9.7 10.1  GFRNONAA  --  >60 >60 >60  --   PROT 6.5 7.0 7.3  --  6.5  ALBUMIN 4.3 4.3 4.6  --  4.4  AST 14 21 20   --  20  ALT 24 26 28   --  33  ALKPHOS 26* 28* 21*  --  20*  BILITOT 1.2 1.0 1.2  --  1.3*  BILIDIR 0.2  --  0.1  --  0.2  IBILI  --   --  1.1*  --   --     RADIOGRAPHIC STUDIES: I have personally reviewed the radiological images as listed and agreed with the findings in the report. CT CORONARY MORPH W/CTA COR W/SCORE W/CA W/CM &/OR WO/CM  Addendum Date: 02/27/2023   ADDENDUM REPORT: 02/27/2023 23:50 EXAM: OVER-READ INTERPRETATION  CT CHEST The following report is an over-read performed by radiologist Dr. Carola Frost [facility] on 02/27/2023. This over-read does not include interpretation of cardiac or coronary anatomy or pathology. The coronary CTA interpretation by the cardiologist is attached. COMPARISON:  CT chest 09/16/2023 FINDINGS: The visualized lungs are clear. There is no lymphadenopathy in the lower mediastinum. Visualized upper abdomen is unremarkable. No acute osseous findings. IMPRESSION: No acute extracardiac findings. Electronically Signed   By: Darliss Cheney M.D.   On: 02/27/2023 23:50   Result Date: 02/27/2023 CLINICAL DATA:  LBBB, coronary calcifications, palpitations EXAM: Cardiac/Coronary  CTA TECHNIQUE: The patient was scanned on a Siemens Somatom go.Top scanner. : A retrospective scan was triggered in the ascending thoracic aorta. Axial non-contrast 3 mm slices were carried out through the heart. The data set was analyzed on a dedicated work station and scored using the Agatson  method. Gantry rotation speed was 330 msecs and collimation was .6 mm.  100mg  of metoprolol and 0.8 mg of sl NTG was given. The 3D data set was reconstructed in 5% intervals of the 60-95 % of the R-R cycle. Diastolic phases were analyzed on a dedicated work station using MPR, MIP and VRT modes. The patient received 100 cc of contrast. FINDINGS: Aorta: Normal size. Minimal aortic root and descending aorta calcifications. No dissection. Aortic Valve:  Trileaflet.  No calcifications. Coronary Arteries:  Normal coronary origin.  Right dominance. RCA is a dominant artery. There is no plaque. Left main gives rise to Ramus, LAD and LCX arteries. LM has no disease. Ramus has calcified plaque proximally causing minimal stenosis (<25%). LAD has calcified plaque proximally causing minimal stenosis (<25%). LCX is a non-dominant artery. There is calcified plaque proximally causing minimal stenosis (<25%). Other findings: Normal pulmonary vein drainage into the left atrium. Normal left atrial appendage without a thrombus. Normal size of the pulmonary artery. IMPRESSION: 1. Coronary calcium score of 44.6. This was 62nd percentile for age and sex matched control. 2. Normal coronary origin with right dominance. 3. Minimal LAD, LCx, Ramus stenosis (<25%). 4. CAD-RADS 1. Minimal non-obstructive CAD (0-24%). Consider non-atherosclerotic causes of chest pain. Consider preventive therapy and risk factor modification. Electronically Signed: By: Debbe Odea M.D. On: 02/25/2023 16:41    ASSESSMENT & PLAN:   Carcinoma of upper-outer quadrant of right breast in female, estrogen receptor positive [facility]) # March 2022- Right breast cancer Stage IA ER/PR positive; her 2 NEG' Oncotype- low risk.  S/p RT [03/20/2021]; JAN 2024-[Dr.Sakai] Bil Mammo-BIL.on anastrazole- stable.   # Tolerating anastrozole fairly well with minor side effects- hot flashes.  Continue anastrozole for total of 5 years [until spring 2027]  # Hot flashes-sec to  anastrazole G-1- monitor for now  # OSTEOPOROSIS-T score= - 4.0 on reclast  Q4m [OCT 2022]. Continue calcium plus vitamin D. [dentist-Dr.Monahan].  Suspect acute infusion reaction rather than osteonecrosis of jaw.  Discussed regarding Tylenol/Benadryl as needed postinfusion.  Reclast q 75m; ? Labs-pcp  [labs with pcp in 6 months- oct 14th, 24]; Monday- if SE # DISPOSITION: # Follow up in 6 months MD; no labs; reclast -   Dr.B    All questions were answered. The patient/family knows to call the clinic with any problems, questions or concerns.    Earna Coder, MD 03/27/2023 3:28 PM

## 2023-03-27 NOTE — Assessment & Plan Note (Addendum)
#   March 2022- Right breast cancer Stage IA ER/PR positive; her 2 NEG' Oncotype- low risk.  S/p RT [03/20/2021]; JAN 2024-[Dr.Sakai] Bil Mammo-BIL.on anastrazole- stable.   # Tolerating anastrozole fairly well with minor side effects- hot flashes.  Continue anastrozole for total of 5 years [until spring 2027]  # Hot flashes-sec to anastrazole G-1- monitor for now  # OSTEOPOROSIS-T score= - 4.0 on reclast  Q42m [OCT 2022]. Continue calcium plus vitamin D. [dentist-Dr.Monahan].  Suspect acute infusion reaction rather than osteonecrosis of jaw.  Discussed regarding Tylenol/Benadryl as needed postinfusion.  Reclast q 55m; ? Labs-pcp  [labs with pcp in 6 months- oct 14th, 24]; Monday- if SE # DISPOSITION: # Follow up in 6 months MD; no labs; reclast -   Dr.B

## 2023-04-08 ENCOUNTER — Encounter: Payer: Self-pay | Admitting: Internal Medicine

## 2023-04-08 NOTE — Telephone Encounter (Signed)
LMTCB

## 2023-04-08 NOTE — Telephone Encounter (Signed)
You have her scheduled to have her liver function rechecked 04/17/23. Patient wanting to cancel. Let me know if you are ok with this.

## 2023-04-08 NOTE — Telephone Encounter (Signed)
If she desires not to have rechecked - can cancel the lab appt.  Will need to follow up with lab in future.

## 2023-04-09 NOTE — Telephone Encounter (Signed)
If she is agreeable, would like to recheck liver panel in 6-06/2023

## 2023-04-09 NOTE — Telephone Encounter (Signed)
Do you want to recheck her liver function sooner than October or are you okay to wait until then?

## 2023-04-16 ENCOUNTER — Other Ambulatory Visit: Payer: PPO

## 2023-04-17 ENCOUNTER — Other Ambulatory Visit: Payer: PPO

## 2023-04-23 ENCOUNTER — Other Ambulatory Visit (INDEPENDENT_AMBULATORY_CARE_PROVIDER_SITE_OTHER): Payer: PPO

## 2023-04-24 LAB — HEPATIC FUNCTION PANEL
ALT: 35 U/L (ref 0–35)
AST: 20 U/L (ref 0–37)
Albumin: 4.4 g/dL (ref 3.5–5.2)
Alkaline Phosphatase: 24 U/L — ABNORMAL LOW (ref 39–117)
Bilirubin, Direct: 0.3 mg/dL (ref 0.0–0.3)
Total Bilirubin: 1.3 mg/dL — ABNORMAL HIGH (ref 0.2–1.2)
Total Protein: 6.3 g/dL (ref 6.0–8.3)

## 2023-04-26 ENCOUNTER — Encounter: Payer: Self-pay | Admitting: *Deleted

## 2023-05-07 ENCOUNTER — Other Ambulatory Visit: Payer: PPO

## 2023-05-24 ENCOUNTER — Telehealth: Payer: Self-pay | Admitting: Internal Medicine

## 2023-05-24 NOTE — Telephone Encounter (Signed)
Patient need orders  °

## 2023-05-24 NOTE — Telephone Encounter (Signed)
Order placed for liver panel.  

## 2023-05-28 ENCOUNTER — Other Ambulatory Visit: Payer: PPO

## 2023-05-28 ENCOUNTER — Other Ambulatory Visit (INDEPENDENT_AMBULATORY_CARE_PROVIDER_SITE_OTHER): Payer: PPO

## 2023-05-29 ENCOUNTER — Other Ambulatory Visit: Payer: PPO

## 2023-05-29 LAB — HEPATIC FUNCTION PANEL
ALT: 44 U/L — ABNORMAL HIGH (ref 0–35)
AST: 25 U/L (ref 0–37)
Albumin: 4.3 g/dL (ref 3.5–5.2)
Alkaline Phosphatase: 23 U/L — ABNORMAL LOW (ref 39–117)
Bilirubin, Direct: 0.2 mg/dL (ref 0.0–0.3)
Total Bilirubin: 1.2 mg/dL (ref 0.2–1.2)
Total Protein: 6.5 g/dL (ref 6.0–8.3)

## 2023-05-31 ENCOUNTER — Other Ambulatory Visit: Payer: Self-pay

## 2023-05-31 DIAGNOSIS — H40003 Preglaucoma, unspecified, bilateral: Secondary | ICD-10-CM | POA: Diagnosis not present

## 2023-05-31 DIAGNOSIS — H2512 Age-related nuclear cataract, left eye: Secondary | ICD-10-CM | POA: Diagnosis not present

## 2023-05-31 DIAGNOSIS — R7989 Other specified abnormal findings of blood chemistry: Secondary | ICD-10-CM

## 2023-05-31 DIAGNOSIS — H2511 Age-related nuclear cataract, right eye: Secondary | ICD-10-CM | POA: Diagnosis not present

## 2023-06-14 ENCOUNTER — Telehealth: Payer: Self-pay | Admitting: Internal Medicine

## 2023-06-14 NOTE — Telephone Encounter (Signed)
Copied from CRM [id number]. Topic: Medicare AWV >> Jun 14, 2023  2:21 PM Payton Doughty wrote: Reason for CRM: LM 06/14/2023 to schedule AWV   Verlee Rossetti; Care Guide Ambulatory Clinical Support [place] [facility] Direct Dial: [phone]

## 2023-07-04 ENCOUNTER — Other Ambulatory Visit: Payer: PPO

## 2023-07-04 DIAGNOSIS — R7989 Other specified abnormal findings of blood chemistry: Secondary | ICD-10-CM

## 2023-07-18 ENCOUNTER — Encounter (INDEPENDENT_AMBULATORY_CARE_PROVIDER_SITE_OTHER): Payer: Self-pay

## 2023-08-13 ENCOUNTER — Ambulatory Visit: Payer: Self-pay

## 2023-08-13 ENCOUNTER — Ambulatory Visit (INDEPENDENT_AMBULATORY_CARE_PROVIDER_SITE_OTHER): Payer: PPO | Admitting: *Deleted

## 2023-08-13 VITALS — Ht 64.0 in | Wt 104.0 lb

## 2023-08-13 DIAGNOSIS — Z0000 Encounter for general adult medical examination without abnormal findings: Secondary | ICD-10-CM | POA: Diagnosis not present

## 2023-08-13 NOTE — Progress Notes (Signed)
Subjective:   Tracy Frey is a 72 y.o. female who presents for an Initial Medicare Annual Wellness Visit.  Visit Complete: Virtual  I connected with  Tracy Frey on 08/13/23 by a audio enabled telemedicine application and verified that I am speaking with the correct person using two identifiers.  Patient Location: Home  Provider Location: Office/Clinic  I discussed the limitations of evaluation and management by telemedicine. The patient expressed understanding and agreed to proceed.  Vital Signs: Unable to obtain new vitals due to this being a telehealth visit.   Review of Systems     Cardiac Risk Factors include: advanced age (>62men, >84 women);dyslipidemia;Other (see comment), Risk factor comments: LBBB     Objective:    Today's Vitals   08/13/23 1419  Weight: 104 lb (47.2 kg)  Height: 5\' 4"  (1.626 m)   Body mass index is 17.85 kg/m.     08/13/2023    2:38 PM 03/27/2023    2:38 PM 03/27/2023    1:30 PM 12/16/2022    3:50 PM 09/19/2022    1:28 PM 03/21/2022    1:54 PM 03/21/2022    1:26 PM  Advanced Directives  Does Patient Have a Medical Advance Directive? Yes Yes Yes Yes Yes No No  Type of Estate agent of [place];Living will Healthcare Power of [place];Living will Healthcare Power of [place];Living will Healthcare Power of [employer] of [place];Living will    Does patient want to make changes to medical advance directive?   No - Patient declined No - Patient declined     Copy of Healthcare Power of Attorney in Chart? No - copy requested  No - copy requested      Would patient like information on creating a medical advance directive?      No - Patient declined No - Patient declined    Current Medications (verified) Outpatient Encounter Medications as of 08/13/2023  Medication Sig   anastrozole (ARIMIDEX) 1 MG tablet Take 1 tablet (1 mg total) by mouth daily.   Calcium Citrate-Vitamin D (CALCIUM CITRATE + D PO) Take 2  tablets by mouth 2 (two) times daily.   clindamycin-benzoyl peroxide (BENZACLIN) gel Apply 1 application topically 2 (two) times daily as needed (acne).   diphenhydrAMINE (BENADRYL) 25 MG tablet Take 25 mg by mouth at bedtime as needed for allergies.   ezetimibe (ZETIA) 10 MG tablet Take 1 tablet (10 mg total) by mouth daily.   Fiber POWD Take 1 Dose by mouth daily. 1 dose = 1 tablespoon   Glucosamine-Chondroitin (OSTEO BI-FLEX REGULAR STRENGTH PO) Take 1 tablet by mouth daily.   hydrOXYzine (ATARAX) 25 MG tablet Take 1 tablet (25 mg total) by mouth 3 (three) times daily as needed for anxiety. (Patient taking differently: Take 25 mg by mouth 3 (three) times daily as needed for anxiety. Pt states she takes 1/4 tab prn)   Multiple Vitamins-Minerals (CENTRUM SILVER ADULT 50+) TABS Take 1 tablet by mouth daily.   Omega-3 Fatty Acids (FISH OIL) 1000 MG CAPS Take 1,000 mg by mouth daily.   Probiotic Product (PROBIOTIC-10 PO) Take 1 capsule by mouth daily.   rosuvastatin (CRESTOR) 20 MG tablet TAKE 1 TABLET BY MOUTH EVERY DAY   tretinoin (RETIN-A) 0.05 % cream Apply topically at bedtime as needed.   No facility-administered encounter medications on file as of 08/13/2023.    Allergies (verified) Amoxicillin-pot clavulanate, Chocolate, and Sulfa antibiotics   History: Past Medical History:  Diagnosis Date   Hypercholesterolemia  Personal history of radiation therapy    Seasonal allergies    Past Surgical History:  Procedure Laterality Date   ABDOMINAL HYSTERECTOMY     BREAST EXCISIONAL BIOPSY Right 2003   neg   BREAST LUMPECTOMY Right    benign   BREAST LUMPECTOMY WITH SENTINEL LYMPH NODE BIOPSY Right 01/13/2021   Procedure: BREAST LUMPECTOMY WITH SENTINEL LYMPH NODE BX;  Surgeon: Earline Mayotte, MD;  Location: [facility] ORS;  Service: General;  Laterality: Right;   COLONOSCOPY WITH PROPOFOL N/A 06/25/2018   Procedure: COLONOSCOPY WITH PROPOFOL;  Surgeon: Scot Jun, MD;  Location:  [facility] ENDOSCOPY;  Service: Endoscopy;  Laterality: N/A;   PARTIAL HYSTERECTOMY  1988   secondary to fibroids and heavy bleeding and endometriosis   TONSILLECTOMY     age 72   Family History  Problem Relation Age of Onset   Hypercholesterolemia Mother    Hypothyroidism Sister    Hypercholesterolemia Sister    Breast cancer Maternal Aunt 65   Colon cancer Neg Hx    Social History   Socioeconomic History   Marital status: Married    Spouse name: Not on file   Number of children: 2   Years of education: Not on file   Highest education level: Not on file  Occupational History    Employer: [employer]  Tobacco Use   Smoking status: Never   Smokeless tobacco: Never  Vaping Use   Vaping status: Never Used  Substance and Sexual Activity   Alcohol use: Yes    Alcohol/week: 0.0 standard drinks of alcohol    Comment: ocassionally   Drug use: No   Sexual activity: Not on file  Other Topics Concern   Not on file  Social History Narrative   Lives in [place] with husband; 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    Social Determinants of Health   Financial Resource Strain: Low Risk  (08/13/2023)   Overall Financial Resource Strain (CARDIA)    Difficulty of Paying Living Expenses: Not hard at all  Food Insecurity: No Food Insecurity (08/13/2023)   Hunger Vital Sign    Worried About Running Out of Food in the Last Year: Never true    Ran Out of Food in the Last Year: Never true  Transportation Needs: No Transportation Needs (08/13/2023)   PRAPARE - Administrator, Civil Service (Medical): No    Lack of Transportation (Non-Medical): No  Physical Activity: Sufficiently Active (08/13/2023)   Exercise Vital Sign    Days of Exercise per Week: 5 days    Minutes of Exercise per Session: 40 min  Stress: No Stress Concern Present (08/13/2023)   [employer] of Occupational Health - Occupational Stress Questionnaire    Feeling of Stress : Not at all  Social  Connections: Socially Integrated (08/13/2023)   Social Connection and Isolation Panel [NHANES]    Frequency of Communication with Friends and Family: More than three times a week    Frequency of Social Gatherings with Friends and Family: Three times a week    Attends Religious Services: More than 4 times per year    Active Member of Clubs or Organizations: Yes    Attends Engineer, structural: More than 4 times per year    Marital Status: Married    Tobacco Counseling Counseling given: Not Answered   Clinical Intake:  Pre-visit preparation completed: Yes  Pain : No/denies pain     BMI - recorded: 17.85 Nutritional Status: BMI <19  Underweight  Nutritional Risks: None Diabetes: No  How often do you need to have someone help you when you read instructions, pamphlets, or other written materials from your doctor or pharmacy?: 1 - Never  Interpreter Needed?: No  Information entered by :: R. Mayra Jolliffe LPN   Activities of Daily Living    08/13/2023    2:21 PM  In your present state of health, do you have any difficulty performing the following activities:  Hearing? 0  Vision? 0  Comment glasses  Difficulty concentrating or making decisions? 0  Walking or climbing stairs? 0  Dressing or bathing? 0  Doing errands, shopping? 0  Preparing Food and eating ? N  Using the Toilet? N  In the past six months, have you accidently leaked urine? N  Do you have problems with loss of bowel control? N  Managing your Medications? N  Managing your Finances? N  Housekeeping or managing your Housekeeping? N    Patient Care Team: Dale Littlefield, MD as PCP - General (Internal Medicine) Dale Winfield, MD (Internal Medicine) Earna Coder, MD as Consulting Physician (Internal Medicine) Lemar Livings Merrily Pew, MD as Consulting Physician (General Surgery) Carmina Miller, MD as Consulting Physician (Radiation Oncology)  Indicate any recent Medical Services you may have received  from other than [facility] providers in the past year (date may be approximate).     Assessment:   This is a routine wellness examination for [employer].  Hearing/Vision screen Hearing Screening - Comments:: No issues Vision Screening - Comments:: Glasses at times   Goals Addressed             This Visit's Progress    Patient Stated       Wants to take more time to do some things for herself, walk more       Depression Screen    08/13/2023    2:29 PM 12/18/2022    3:42 PM 09/13/2022    2:23 PM 03/13/2022   10:29 AM 09/12/2021    2:16 PM 12/12/2020    2:50 PM 08/04/2019    2:41 PM  PHQ 2/9 Scores  PHQ - 2 Score 0 0 0 0 0 0 1  PHQ- 9 Score 0      1    Fall Risk    08/13/2023    2:22 PM 12/18/2022    3:41 PM 09/13/2022    2:23 PM 03/13/2022   10:29 AM 09/12/2021    2:16 PM  Fall Risk   Falls in the past year? 0 0 0 0 0  Number falls in past yr: 0 0   0  Injury with Fall? 0 0   0  Risk for fall due to : No Fall Risks No Fall Risks No Fall Risks No Fall Risks No Fall Risks  Follow up Falls prevention discussed;Falls evaluation completed Falls evaluation completed Falls evaluation completed Falls evaluation completed Falls evaluation completed    MEDICARE RISK AT HOME: Medicare Risk at Home Any stairs in or around the home?: Yes If so, are there any without handrails?: No Home free of loose throw rugs in walkways, pet beds, electrical cords, etc?: Yes Adequate lighting in your home to reduce risk of falls?: Yes Life alert?: No Use of a cane, walker or w/c?: No Grab bars in the bathroom?: No Shower chair or bench in shower?: Yes Elevated toilet seat or a handicapped toilet?: Yes      Cognitive Function:        08/13/2023    2:39  PM  6CIT Screen  What Year? 0 points  What month? 0 points  What time? 0 points  Count back from 20 0 points  Months in reverse 0 points  Repeat phrase 0 points  Total Score 0 points    Immunizations Immunization History  Administered  Date(s) Administered   Fluad Quad(high Dose 65+) 08/04/2019, 09/12/2021, 09/13/2022   Influenza, High Dose Seasonal PF 11/19/2017, 09/23/2018   Influenza,inj,Quad PF,6+ Mos 10/01/2016   PFIZER(Purple Top)SARS-COV-2 Vaccination 12/24/2019, 01/14/2020, 10/06/2020   Tdap 07/17/2017    TDAP status: Up to date  Flu Vaccine status: Due, Education has been provided regarding the importance of this vaccine. Advised may receive this vaccine at local pharmacy or Health Dept. Aware to provide a copy of the vaccination record if obtained from local pharmacy or Health Dept. Verbalized acceptance and understanding.  Pneumococcal vaccine status: Due, Education has been provided regarding the importance of this vaccine. Advised may receive this vaccine at local pharmacy or Health Dept. Aware to provide a copy of the vaccination record if obtained from local pharmacy or Health Dept. Verbalized acceptance and understanding.  Covid-19 vaccine status: Information provided on how to obtain vaccines.   Qualifies for Shingles Vaccine? Yes   Zostavax completed No  Shingrix Completed?: No.    Education has been provided regarding the importance of this vaccine. Patient has been advised to call insurance company to determine out of pocket expense if they have not yet received this vaccine. Advised may also receive vaccine at local pharmacy or Health Dept. Verbalized acceptance and understanding.  Screening Tests Health Maintenance  Topic Date Due   Medicare Annual Wellness (AWV)  Never done   Hepatitis C Screening  Never done   Zoster Vaccines- Shingrix (1 of 2) Never done   INFLUENZA VACCINE  07/04/2023   COVID-19 Vaccine (4 - 2023-24 season) 08/04/2023   Pneumonia Vaccine 53+ Years old (1 of 1 - PCV) 03/14/2024 (Originally 10/02/2017)   MAMMOGRAM  12/20/2023   DTaP/Tdap/Td (2 - Td or Tdap) 07/18/2027   Colonoscopy  06/25/2028   DEXA SCAN  Completed   HPV VACCINES  Aged Out    Health Maintenance  Health  Maintenance Due  Topic Date Due   Medicare Annual Wellness (AWV)  Never done   Hepatitis C Screening  Never done   Zoster Vaccines- Shingrix (1 of 2) Never done   INFLUENZA VACCINE  07/04/2023   COVID-19 Vaccine (4 - 2023-24 season) 08/04/2023    Colorectal cancer screening: Type of screening: Colonoscopy. Completed 7/19. Repeat every 10 years  Mammogram status: Completed 1/24. Repeat every year  Bone Density status: Completed 1/22. Results reflect: Bone density results: OSTEOPOROSIS. Repeat every 2 years.  Lung Cancer Screening: (Low Dose CT Chest recommended if Age 44-80 years, 20 pack-year currently smoking OR have quit w/in 15years.) does not qualify.     Additional Screening:  Hepatitis C Screening: does qualify; Wants to discuss with PCP at next visit  Vision Screening: Recommended annual ophthalmology exams for early detection of glaucoma and other disorders of the eye. Is the patient up to date with their annual eye exam?  Yes  Who is the provider or what is the name of the office in which the patient attends annual eye exams? [facility] If pt is not established with a provider, would they like to be referred to a provider to establish care? No .   Dental Screening: Recommended annual dental exams for proper oral hygiene    [facility]  Referral / Chronic Care Management: CRR required this visit?  No   CCM required this visit?  No     Plan:     I have personally reviewed and noted the following in the patient's chart:   Medical and social history Use of alcohol, tobacco or illicit drugs  Current medications and supplements including opioid prescriptions. Patient is not currently taking opioid prescriptions. Functional ability and status Nutritional status Physical activity Advanced directives List of other physicians Hospitalizations, surgeries, and ER visits in previous 12 months Vitals Screenings to include cognitive, depression, and  falls Referrals and appointments  In addition, I have reviewed and discussed with patient certain preventive protocols, quality metrics, and best practice recommendations. A written personalized care plan for preventive services as well as general preventive health recommendations were provided to patient.     Sydell Axon, LPN   0/93/2355   After Visit Summary: (MyChart) Due to this being a telephonic visit, the after visit summary with patients personalized plan was offered to patient via MyChart   Nurse Notes: None

## 2023-08-13 NOTE — Patient Instructions (Signed)
Ms. Lisiecki , Thank you for taking time to come for your Medicare Wellness Visit. I appreciate your ongoing commitment to your health goals. Please review the following plan we discussed and let me know if I can assist you in the future.   Referrals/Orders/Follow-Ups/Clinician Recommendations: None  This is a list of the screening recommended for you and due dates:  Health Maintenance  Topic Date Due   Hepatitis C Screening  Never done   Zoster (Shingles) Vaccine (1 of 2) Never done   Flu Shot  07/04/2023   COVID-19 Vaccine (4 - 2023-24 season) 08/04/2023   Pneumonia Vaccine (1 of 1 - PCV) 03/14/2024*   Mammogram  12/20/2023   Medicare Annual Wellness Visit  08/12/2024   DTaP/Tdap/Td vaccine (2 - Td or Tdap) 07/18/2027   Colon Cancer Screening  06/25/2028   DEXA scan (bone density measurement)  Completed   HPV Vaccine  Aged Out  *Topic was postponed. The date shown is not the original due date.    Advanced directives: (Copy Requested) Please bring a copy of your health care power of attorney and living will to the office to be added to your chart at your convenience.  Next Medicare Annual Wellness Visit scheduled for next year: Yes 08/17/24 @ 3:00

## 2023-09-04 DIAGNOSIS — L700 Acne vulgaris: Secondary | ICD-10-CM | POA: Diagnosis not present

## 2023-09-04 DIAGNOSIS — L821 Other seborrheic keratosis: Secondary | ICD-10-CM | POA: Diagnosis not present

## 2023-09-04 DIAGNOSIS — D2372 Other benign neoplasm of skin of left lower limb, including hip: Secondary | ICD-10-CM | POA: Diagnosis not present

## 2023-09-05 ENCOUNTER — Other Ambulatory Visit: Payer: Self-pay | Admitting: Internal Medicine

## 2023-09-12 ENCOUNTER — Telehealth: Payer: Self-pay | Admitting: Internal Medicine

## 2023-09-12 DIAGNOSIS — R739 Hyperglycemia, unspecified: Secondary | ICD-10-CM

## 2023-09-12 DIAGNOSIS — E7800 Pure hypercholesterolemia, unspecified: Secondary | ICD-10-CM

## 2023-09-12 NOTE — Telephone Encounter (Signed)
Patient need lab orders.

## 2023-09-13 NOTE — Telephone Encounter (Signed)
FUTURE LABS ORDERED

## 2023-09-16 ENCOUNTER — Other Ambulatory Visit (INDEPENDENT_AMBULATORY_CARE_PROVIDER_SITE_OTHER): Payer: PPO

## 2023-09-16 DIAGNOSIS — E7800 Pure hypercholesterolemia, unspecified: Secondary | ICD-10-CM | POA: Diagnosis not present

## 2023-09-16 DIAGNOSIS — R739 Hyperglycemia, unspecified: Secondary | ICD-10-CM | POA: Diagnosis not present

## 2023-09-16 LAB — LIPID PANEL
Cholesterol: 148 mg/dL (ref 0–200)
HDL: 66.4 mg/dL (ref >39.00)
LDL Cholesterol: 71 mg/dL (ref 0–99)
NonHDL: 82.09
Total CHOL/HDL Ratio: 2
Triglycerides: 57 mg/dL (ref 0.0–149.0)
VLDL: 11.4 mg/dL (ref 0.0–40.0)

## 2023-09-16 LAB — BASIC METABOLIC PANEL
BUN: 17 mg/dL (ref 6–23)
CO2: 32 meq/L (ref 19–32)
Calcium: 10.3 mg/dL (ref 8.4–10.5)
Chloride: 100 meq/L (ref 96–112)
Creatinine, Ser: 0.8 mg/dL (ref 0.40–1.20)
GFR: 74.37 mL/min (ref >60.00)
Glucose, Bld: 104 mg/dL — ABNORMAL HIGH (ref 70–99)
Potassium: 3.5 meq/L (ref 3.5–5.1)
Sodium: 139 meq/L (ref 135–145)

## 2023-09-16 LAB — HEPATIC FUNCTION PANEL
ALT: 30 U/L (ref 0–35)
AST: 17 U/L (ref 0–37)
Albumin: 4.4 g/dL (ref 3.5–5.2)
Alkaline Phosphatase: 28 U/L — ABNORMAL LOW (ref 39–117)
Bilirubin, Direct: 0.2 mg/dL (ref 0.0–0.3)
Total Bilirubin: 1.4 mg/dL — ABNORMAL HIGH (ref 0.2–1.2)
Total Protein: 6.8 g/dL (ref 6.0–8.3)

## 2023-09-16 LAB — HEMOGLOBIN A1C: Hgb A1c MFr Bld: 6 % (ref 4.6–6.5)

## 2023-09-18 ENCOUNTER — Inpatient Hospital Stay: Payer: PPO

## 2023-09-18 ENCOUNTER — Inpatient Hospital Stay: Payer: PPO | Attending: Internal Medicine | Admitting: Internal Medicine

## 2023-09-18 ENCOUNTER — Encounter: Payer: Self-pay | Admitting: Internal Medicine

## 2023-09-18 VITALS — BP 148/80 | HR 76 | Temp 96.5°F | Ht 64.0 in | Wt 105.0 lb

## 2023-09-18 DIAGNOSIS — M810 Age-related osteoporosis without current pathological fracture: Secondary | ICD-10-CM | POA: Diagnosis not present

## 2023-09-18 DIAGNOSIS — C50411 Malignant neoplasm of upper-outer quadrant of right female breast: Secondary | ICD-10-CM | POA: Diagnosis not present

## 2023-09-18 DIAGNOSIS — Z170 Estrogen receptor positive status [ER+]: Secondary | ICD-10-CM

## 2023-09-18 DIAGNOSIS — Z79811 Long term (current) use of aromatase inhibitors: Secondary | ICD-10-CM | POA: Diagnosis not present

## 2023-09-18 MED ORDER — ANASTROZOLE 1 MG PO TABS
1.0000 mg | ORAL_TABLET | Freq: Every day | ORAL | 3 refills | Status: DC
Start: 2023-09-18 — End: 2024-03-18

## 2023-09-18 MED ORDER — ZOLEDRONIC ACID 5 MG/100ML IV SOLN
5.0000 mg | Freq: Once | INTRAVENOUS | Status: AC
  Administered 2023-09-18: 5 mg via INTRAVENOUS
  Filled 2023-09-18: qty 100

## 2023-09-18 MED ORDER — SODIUM CHLORIDE 0.9 % IV SOLN
Freq: Once | INTRAVENOUS | Status: AC
  Filled 2023-09-18: qty 250

## 2023-09-18 NOTE — Assessment & Plan Note (Addendum)
#   March 2022- Right breast cancer Stage IA ER/PR positive; her 2 NEG' Oncotype- low risk.  S/p RT [03/20/2021]; JAN 2024-[Dr.Sakai] Bil Mammo-BIL.on anastrazole-stable.   # Tolerating anastrozole fairly well with minor side effects- hot flashes.  Continue anastrozole for total of 5 years [until spring 2027]- stable.   # Hot flashes-sec to anastrazole G-1- monitor for now- stable.   # OSTEOPOROSIS-T score= - 4.0 on reclast  Q22m [OCT 2022]. Continue calcium plus vitamin D. [dentist-Dr.Monahan].  Suspect acute infusion reaction rather than osteonecrosis of jaw.  Discussed regarding Tylenol/Benadryl as needed postinfusion. Stable.   # Slightly elevated Bili- clinically not significant- monitor with PCP.   Reclast q 61m; Dr.SCott-Labs-  # DISPOSITION: # Reclast  # Follow up in 6 months MD- No labs-  Dr.B

## 2023-09-18 NOTE — Progress Notes (Signed)
Pt has questions about the anastrozole. Had labs done Monday at [place]. Bilirubin was high per pt.

## 2023-09-18 NOTE — Patient Instructions (Signed)

## 2023-09-18 NOTE — Progress Notes (Signed)
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 [s/p TR- April, 18th, 2022]  # April mid -2022- START Anastrazole   # OSTEOPOROSIS:BMD- T score= -4.1 [Fosomax- Jan 2022]; 09/20/2021- Reclast    # SURVIVORSHIP:   # GENETICS: declined  DIAGNOSIS: Right breast cancer  STAGE:   I      ;  GOALS: cure  CURRENT/MOST RECENT THERAPY : RT/AI    Carcinoma of upper-outer quadrant of right breast in female, estrogen receptor positive (HCC)  02/06/2021 Initial Diagnosis   Carcinoma of upper-outer quadrant of right breast in female, estrogen receptor positive (HCC)   02/06/2021 Cancer Staging   Staging form: Breast, AJCC 8th Edition - Pathologic: Stage IA (pT1c, pN0, cM0, G1, ER+, PR+, HER2-) - Signed by Earna Coder, MD on 02/06/2021 Stage prefix: Initial diagnosis Histologic grading system: 3 grade system Menopausal status: Postmenopausal     HISTORY OF PRESENTING ILLNESS: Alone.  Ambulating independently.  Tracy Frey 72 y.o.  female stage I ER/PR positive HER2 negative breast cancer currently s/p adjuvant radiation-currently on anastrozole is here for follow-up.  Patient complaint with anastrozole. Had labs done Monday at [facility]. Bilirubin was high per pt   No jaw discomfort after last reclast infusion.   Complains  of hot flashes mild. Patient denies any worsening joint pains.  No shortness of breath or cough.  Review of Systems  Constitutional:  Negative for chills, diaphoresis, fever, malaise/fatigue and weight loss.  HENT:  Negative for nosebleeds and sore throat.   Eyes:  Negative for double vision.  Respiratory:  Negative for cough, hemoptysis, sputum production, shortness of breath and wheezing.   Cardiovascular:  Negative for chest pain, palpitations, orthopnea and leg swelling.  Gastrointestinal:  Negative for abdominal pain, blood in stool, constipation, diarrhea, heartburn, melena, nausea and vomiting.  Genitourinary:  Negative for dysuria, frequency and urgency.  Musculoskeletal:  Negative for back pain and joint pain.  Skin: Negative.  Negative for itching and rash.  Neurological:  Negative for dizziness, tingling, focal weakness, weakness and headaches.  Endo/Heme/Allergies:  Does not bruise/bleed easily.  Psychiatric/Behavioral:  Negative for depression. The patient is not nervous/anxious and does not have insomnia.      MEDICAL HISTORY:  Past Medical History:  Diagnosis Date   Hypercholesterolemia    Personal history of radiation therapy    Seasonal allergies     SURGICAL HISTORY: Past Surgical History:  Procedure Laterality Date   ABDOMINAL HYSTERECTOMY     BREAST EXCISIONAL BIOPSY Right 2003   neg   BREAST LUMPECTOMY Right    benign   BREAST LUMPECTOMY WITH SENTINEL LYMPH NODE BIOPSY Right 01/13/2021   Procedure: BREAST LUMPECTOMY WITH SENTINEL LYMPH NODE BX;  Surgeon: Earline Mayotte, MD;  Location: [facility] ORS;  Service: General;  Laterality: Right;   COLONOSCOPY WITH PROPOFOL N/A 06/25/2018   Procedure: COLONOSCOPY WITH PROPOFOL;  Surgeon: Scot Jun, MD;  Location: [facility] ENDOSCOPY;  Service:  Endoscopy;  Laterality: N/A;   PARTIAL HYSTERECTOMY  1988   secondary to fibroids and heavy bleeding and endometriosis   TONSILLECTOMY     age 55    SOCIAL HISTORY: Social  History   Socioeconomic History   Marital status: Married    Spouse name: Not on file   Number of children: 2   Years of education: Not on file   Highest education level: Not on file  Occupational History    Employer: [employer]  Tobacco Use   Smoking status: Never   Smokeless tobacco: Never  Vaping Use   Vaping status: Never Used  Substance and Sexual Activity   Alcohol use: Yes    Alcohol/week: 0.0 standard drinks of alcohol    Comment: ocassionally   Drug use: No   Sexual activity: Not on file  Other Topics Concern   Not on file  Social History Narrative   Lives in [place] with husband; 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    Social Determinants of Health   Financial Resource Strain: Low Risk  (08/13/2023)   Overall Financial Resource Strain (CARDIA)    Difficulty of Paying Living Expenses: Not hard at all  Food Insecurity: No Food Insecurity (08/13/2023)   Hunger Vital Sign    Worried About Running Out of Food in the Last Year: Never true    Ran Out of Food in the Last Year: Never true  Transportation Needs: No Transportation Needs (08/13/2023)   PRAPARE - Administrator, Civil Service (Medical): No    Lack of Transportation (Non-Medical): No  Physical Activity: Sufficiently Active (08/13/2023)   Exercise Vital Sign    Days of Exercise per Week: 5 days    Minutes of Exercise per Session: 40 min  Stress: No Stress Concern Present (08/13/2023)   [employer] of Occupational Health - Occupational Stress Questionnaire    Feeling of Stress : Not at all  Social Connections: Socially Integrated (08/13/2023)   Social Connection and Isolation Panel [NHANES]    Frequency of Communication with Friends and Family: More than three times a week    Frequency of Social Gatherings with Friends and Family: Three times a week    Attends Religious Services: More than 4 times per year    Active Member of Clubs or Organizations: Yes    Attends Tax inspector Meetings: More than 4 times per year    Marital Status: Married  Catering manager Violence: Not At Risk (08/13/2023)   Humiliation, Afraid, Rape, and Kick questionnaire    Fear of Current or Ex-Partner: No    Emotionally Abused: No    Physically Abused: No    Sexually Abused: No    FAMILY HISTORY: Family History  Problem Relation Age of Onset   Hypercholesterolemia Mother    Hypothyroidism Sister    Hypercholesterolemia Sister    Breast cancer Maternal Aunt 53   Colon cancer Neg Hx     ALLERGIES:  is allergic to amoxicillin-pot clavulanate, chocolate, and sulfa antibiotics.  MEDICATIONS:  Current Outpatient Medications  Medication Sig Dispense Refill   Calcium Citrate-Vitamin D (CALCIUM CITRATE + D PO) Take 2 tablets by mouth 2 (two) times daily.     clindamycin-benzoyl peroxide (BENZACLIN) gel Apply 1 application topically 2 (two) times daily as needed (acne).     diphenhydrAMINE (BENADRYL) 25 MG tablet Take 25 mg by mouth at bedtime as needed for allergies.     ezetimibe (ZETIA) 10 MG tablet Take 1 tablet (  10 mg total) by mouth daily. 90 tablet 3   Fiber POWD Take 1 Dose by mouth daily. 1 dose = 1 tablespoon     Glucosamine-Chondroitin (OSTEO BI-FLEX REGULAR STRENGTH PO) Take 1 tablet by mouth daily.     hydrOXYzine (ATARAX) 25 MG tablet Take 1 tablet (25 mg total) by mouth 3 (three) times daily as needed for anxiety. (Patient taking differently: Take 25 mg by mouth 3 (three) times daily as needed for anxiety. Pt states she takes 1/4 tab prn) 20 tablet 0   Multiple Vitamins-Minerals (CENTRUM SILVER ADULT 50+) TABS Take 1 tablet by mouth daily.     Omega-3 Fatty Acids (FISH OIL) 1000 MG CAPS Take 1,000 mg by mouth daily.     Probiotic Product (PROBIOTIC-10 PO) Take 1 capsule by mouth daily.     rosuvastatin (CRESTOR) 20 MG tablet TAKE 1 TABLET BY MOUTH EVERY DAY 90 tablet 1   tretinoin (RETIN-A) 0.05 % cream Apply topically at bedtime as needed.     anastrozole  (ARIMIDEX) 1 MG tablet Take 1 tablet (1 mg total) by mouth daily. 90 tablet 3   No current facility-administered medications for this visit.      Marland Kitchen  PHYSICAL EXAMINATION: ECOG PERFORMANCE STATUS: 0 - Asymptomatic  Vitals:   09/18/23 1020 09/18/23 1031  BP: (!) 163/77 (!) 148/80  Pulse: 76   Temp: (!) 96.5 F (35.8 C)   SpO2: 100%      Filed Weights   09/18/23 1020  Weight: 105 lb (47.6 kg)      Physical Exam HENT:     Head: Normocephalic and atraumatic.     Mouth/Throat:     Pharynx: No oropharyngeal exudate.  Eyes:     Pupils: Pupils are equal, round, and reactive to light.  Cardiovascular:     Rate and Rhythm: Normal rate and regular rhythm.  Pulmonary:     Effort: Pulmonary effort is normal. No respiratory distress.     Breath sounds: Normal breath sounds. No wheezing.  Abdominal:     General: Bowel sounds are normal. There is no distension.     Palpations: Abdomen is soft. There is no mass.     Tenderness: There is no abdominal tenderness. There is no guarding or rebound.  Musculoskeletal:        General: No tenderness. Normal range of motion.     Cervical back: Normal range of motion and neck supple.  Skin:    General: Skin is warm.  Neurological:     Mental Status: She is alert and oriented to person, place, and time.  Psychiatric:        Mood and Affect: Affect normal.      LABORATORY DATA:  I have reviewed the data as listed Lab Results  Component Value Date   WBC 6.9 12/16/2022   HGB 15.3 (H) 12/16/2022   HCT 46.6 (H) 12/16/2022   MCV 94.9 12/16/2022   PLT 230 12/16/2022   Recent Labs    09/19/22 1258 09/19/22 1258 12/16/22 1549 02/11/23 0824 03/13/23 0734 04/23/23 1421 05/28/23 1439 07/04/23 1411 09/16/23 0729  NA 136  --  139 139 142  --   --   --  139  K 3.7  --  4.2 3.4* 3.7  --   --   --  3.5  CL 99  --  101 101 101  --   --   --  100  CO2 29  --  30 28 33*  --   --   --  32  GLUCOSE 198*  --  141* 123* 104*  --   --   --   104*  BUN 19  --  17 16 18   --   --   --  17  CREATININE 0.75  --  0.69 0.80 0.80  --   --   --  0.80  CALCIUM 9.6  --  10.3 9.7 10.1  --   --   --  10.3  GFRNONAA >60  --  >60 >60  --   --   --   --   --   PROT 7.0  --  7.3  --  6.5   < > 6.5 6.8 6.8  ALBUMIN 4.3  --  4.6  --  4.4   < > 4.3 4.5 4.4  AST 21  --  20  --  20   < > 25 19 17   ALT 26  --  28  --  33   < > 44* 33 30  ALKPHOS 28*  --  21*  --  20*   < > 23* 24* 28*  BILITOT 1.0  --  1.2  --  1.3*   < > 1.2 1.2 1.4*  BILIDIR  --    < > 0.1  --  0.2   < > 0.2 0.2 0.2  IBILI  --   --  1.1*  --   --   --   --   --   --    < > = values in this interval not displayed.    RADIOGRAPHIC STUDIES: I have personally reviewed the radiological images as listed and agreed with the findings in the report. No results found.  ASSESSMENT & PLAN:   Carcinoma of upper-outer quadrant of right breast in female, estrogen receptor positive [facility]) # March 2022- Right breast cancer Stage IA ER/PR positive; her 2 NEG' Oncotype- low risk.  S/p RT [03/20/2021]; JAN 2024-[Dr.Sakai] Bil Mammo-BIL.on anastrazole-stable.   # Tolerating anastrozole fairly well with minor side effects- hot flashes.  Continue anastrozole for total of 5 years [until spring 2027]- stable.   # Hot flashes-sec to anastrazole G-1- monitor for now- stable.   # OSTEOPOROSIS-T score= - 4.0 on reclast  Q21m [OCT 2022]. Continue calcium plus vitamin D. [dentist-Dr.Monahan].  Suspect acute infusion reaction rather than osteonecrosis of jaw.  Discussed regarding Tylenol/Benadryl as needed postinfusion. Stable.   # Slightly elevated Bili- clinically not significant- monitor with PCP.   Reclast q 22m; Dr.SCott-Labs-  # DISPOSITION: # Reclast  # Follow up in 6 months MD- No labs-  Dr.B    All questions were answered. The patient/family knows to call the clinic with any problems, questions or concerns.    Earna Coder, MD 09/18/2023 11:04 AM

## 2023-09-19 ENCOUNTER — Encounter: Payer: Self-pay | Admitting: Internal Medicine

## 2023-09-19 ENCOUNTER — Ambulatory Visit: Payer: PPO | Admitting: Internal Medicine

## 2023-09-19 VITALS — BP 126/74 | HR 85 | Temp 97.8°F | Resp 16 | Ht 64.0 in | Wt 106.0 lb

## 2023-09-19 DIAGNOSIS — I700 Atherosclerosis of aorta: Secondary | ICD-10-CM

## 2023-09-19 DIAGNOSIS — C50411 Malignant neoplasm of upper-outer quadrant of right female breast: Secondary | ICD-10-CM | POA: Diagnosis not present

## 2023-09-19 DIAGNOSIS — Z170 Estrogen receptor positive status [ER+]: Secondary | ICD-10-CM | POA: Diagnosis not present

## 2023-09-19 DIAGNOSIS — F439 Reaction to severe stress, unspecified: Secondary | ICD-10-CM

## 2023-09-19 DIAGNOSIS — Z0000 Encounter for general adult medical examination without abnormal findings: Secondary | ICD-10-CM

## 2023-09-19 DIAGNOSIS — Z23 Encounter for immunization: Secondary | ICD-10-CM

## 2023-09-19 DIAGNOSIS — R739 Hyperglycemia, unspecified: Secondary | ICD-10-CM

## 2023-09-19 DIAGNOSIS — M810 Age-related osteoporosis without current pathological fracture: Secondary | ICD-10-CM

## 2023-09-19 DIAGNOSIS — E7800 Pure hypercholesterolemia, unspecified: Secondary | ICD-10-CM | POA: Diagnosis not present

## 2023-09-19 MED ORDER — EZETIMIBE 10 MG PO TABS: 10.0000 mg | ORAL_TABLET | Freq: Every day | ORAL | 3 refills | Status: DC

## 2023-09-19 NOTE — Addendum Note (Signed)
Addended by: Rita Ohara D on: 09/19/2023 02:38 PM   Modules accepted: Orders

## 2023-09-19 NOTE — Progress Notes (Signed)
Subjective:    Patient ID: Tracy Frey, female    DOB: [birth year], 72 y.o.   MRN: [id number]  Patient here for  Chief Complaint  Patient presents with   Annual Exam    HPI Here for a physical exam. She reports she is doing well.  Daughter's baby due soon.  Handling stress.  Trying to stay active.  Walking - some days 5 miles per day.  No chest pain or sob reported.  No cough or congestion.  No abdominal pain or bowel change.  Saw Dr Donneta Romberg - stable.  Reclast infusion yesterday.  Has f/u with Dr Tonna Boehringer.  He will do her breast exam and order mammogram.  Discussed labs.     Past Medical History:  Diagnosis Date   Hypercholesterolemia    Personal history of radiation therapy    Seasonal allergies    Past Surgical History:  Procedure Laterality Date   ABDOMINAL HYSTERECTOMY     BREAST EXCISIONAL BIOPSY Right 2003   neg   BREAST LUMPECTOMY Right    benign   BREAST LUMPECTOMY WITH SENTINEL LYMPH NODE BIOPSY Right 01/13/2021   Procedure: BREAST LUMPECTOMY WITH SENTINEL LYMPH NODE BX;  Surgeon: Earline Mayotte, MD;  Location: [facility] ORS;  Service: General;  Laterality: Right;   COLONOSCOPY WITH PROPOFOL N/A 06/25/2018   Procedure: COLONOSCOPY WITH PROPOFOL;  Surgeon: Scot Jun, MD;  Location: [facility] ENDOSCOPY;  Service: Endoscopy;  Laterality: N/A;   PARTIAL HYSTERECTOMY  1988   secondary to fibroids and heavy bleeding and endometriosis   TONSILLECTOMY     age 44   Family History  Problem Relation Age of Onset   Hypercholesterolemia Mother    Hypothyroidism Sister    Hypercholesterolemia Sister    Breast cancer Maternal Aunt 61   Colon cancer Neg Hx    Social History   Socioeconomic History   Marital status: Married    Spouse name: Not on file   Number of children: 2   Years of education: Not on file   Highest education level: Not on file  Occupational History    Employer: [employer]  Tobacco Use   Smoking status: Never   Smokeless tobacco: Never  Vaping Use    Vaping status: Never Used  Substance and Sexual Activity   Alcohol use: Yes    Alcohol/week: 0.0 standard drinks of alcohol    Comment: ocassionally   Drug use: No   Sexual activity: Not on file  Other Topics Concern   Not on file  Social History Narrative   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    Social Determinants of Health   Financial Resource Strain: Low Risk  (08/13/2023)   Overall Financial Resource Strain (CARDIA)    Difficulty of Paying Living Expenses: Not hard at all  Food Insecurity: No Food Insecurity (08/13/2023)   Hunger Vital Sign    Worried About Running Out of Food in the Last Year: Never true    Ran Out of Food in the Last Year: Never true  Transportation Needs: No Transportation Needs (08/13/2023)   PRAPARE - Administrator, Civil Service (Medical): No    Lack of Transportation (Non-Medical): No  Physical Activity: Sufficiently Active (08/13/2023)   Exercise Vital Sign    Days of Exercise per Week: 5 days    Minutes of Exercise per Session: 40 min  Stress: No Stress Concern Present (08/13/2023)   [employer] of Occupational Health -  Occupational Stress Questionnaire    Feeling of Stress : Not at all  Social Connections: Socially Integrated (08/13/2023)   Social Connection and Isolation Panel [NHANES]    Frequency of Communication with Friends and Family: More than three times a week    Frequency of Social Gatherings with Friends and Family: Three times a week    Attends Religious Services: More than 4 times per year    Active Member of Clubs or Organizations: Yes    Attends Banker Meetings: More than 4 times per year    Marital Status: Married     Review of Systems  Constitutional:  Negative for appetite change and unexpected weight change.  HENT:  Negative for congestion, sinus pressure and sore throat.   Eyes:  Negative for pain and visual disturbance.  Respiratory:   Negative for cough, chest tightness and shortness of breath.   Cardiovascular:  Negative for chest pain, palpitations and leg swelling.  Gastrointestinal:  Negative for abdominal pain, diarrhea, nausea and vomiting.  Genitourinary:  Negative for difficulty urinating and dysuria.  Musculoskeletal:  Negative for joint swelling and myalgias.  Skin:  Negative for color change and rash.  Neurological:  Negative for dizziness and headaches.  Hematological:  Negative for adenopathy. Does not bruise/bleed easily.  Psychiatric/Behavioral:  Negative for agitation and dysphoric mood.        Objective:     BP 126/74   Pulse 85   Temp 97.8 F (36.6 C)   Resp 16   Ht 5\' 4"  (1.626 m)   Wt 106 lb (48.1 kg)   SpO2 99%   BMI 18.19 kg/m  Wt Readings from Last 3 Encounters:  09/19/23 106 lb (48.1 kg)  09/18/23 105 lb (47.6 kg)  08/13/23 104 lb (47.2 kg)    Physical Exam Vitals reviewed.  Constitutional:      General: She is not in acute distress.    Appearance: Normal appearance.  HENT:     Head: Normocephalic and atraumatic.     Right Ear: External ear normal.     Left Ear: External ear normal.  Eyes:     General: No scleral icterus.       Right eye: No discharge.        Left eye: No discharge.     Conjunctiva/sclera: Conjunctivae normal.  Neck:     Thyroid: No thyromegaly.  Cardiovascular:     Rate and Rhythm: Normal rate and regular rhythm.  Pulmonary:     Effort: No respiratory distress.     Breath sounds: Normal breath sounds. No wheezing.     Comments: Breasts:  by Dr Tonna Boehringer Abdominal:     General: Bowel sounds are normal.     Palpations: Abdomen is soft.     Tenderness: There is no abdominal tenderness.  Musculoskeletal:        General: No swelling or tenderness.     Cervical back: Neck supple. No tenderness.  Lymphadenopathy:     Cervical: No cervical adenopathy.  Skin:    Findings: No erythema or rash.  Neurological:     Mental Status: She is alert.  Psychiatric:         Mood and Affect: Mood normal.        Behavior: Behavior normal.      Outpatient Encounter Medications as of 09/19/2023  Medication Sig   anastrozole (ARIMIDEX) 1 MG tablet Take 1 tablet (1 mg total) by mouth daily.   Calcium Citrate-Vitamin D (CALCIUM CITRATE + D  PO) Take 2 tablets by mouth 2 (two) times daily.   clindamycin-benzoyl peroxide (BENZACLIN) gel Apply 1 application topically 2 (two) times daily as needed (acne).   diphenhydrAMINE (BENADRYL) 25 MG tablet Take 25 mg by mouth at bedtime as needed for allergies.   ezetimibe (ZETIA) 10 MG tablet Take 1 tablet (10 mg total) by mouth daily.   Fiber POWD Take 1 Dose by mouth daily. 1 dose = 1 tablespoon   Glucosamine-Chondroitin (OSTEO BI-FLEX REGULAR STRENGTH PO) Take 1 tablet by mouth daily.   hydrOXYzine (ATARAX) 25 MG tablet Take 1 tablet (25 mg total) by mouth 3 (three) times daily as needed for anxiety. (Patient taking differently: Take 25 mg by mouth 3 (three) times daily as needed for anxiety. Pt states she takes 1/4 tab prn)   Multiple Vitamins-Minerals (CENTRUM SILVER ADULT 50+) TABS Take 1 tablet by mouth daily.   Omega-3 Fatty Acids (FISH OIL) 1000 MG CAPS Take 1,000 mg by mouth daily.   Probiotic Product (PROBIOTIC-10 PO) Take 1 capsule by mouth daily.   rosuvastatin (CRESTOR) 20 MG tablet TAKE 1 TABLET BY MOUTH EVERY DAY   tretinoin (RETIN-A) 0.05 % cream Apply topically at bedtime as needed.   [DISCONTINUED] ezetimibe (ZETIA) 10 MG tablet Take 1 tablet (10 mg total) by mouth daily.   No facility-administered encounter medications on file as of 09/19/2023.     Lab Results  Component Value Date   WBC 6.9 12/16/2022   HGB 15.3 (H) 12/16/2022   HCT 46.6 (H) 12/16/2022   PLT 230 12/16/2022   GLUCOSE 104 (H) 09/16/2023   CHOL 148 09/16/2023   TRIG 57.0 09/16/2023   HDL 66.40 09/16/2023   LDLDIRECT 168.1 04/17/2013   LDLCALC 71 09/16/2023   ALT 30 09/16/2023   AST 17 09/16/2023   NA 139 09/16/2023   K 3.5  09/16/2023   CL 100 09/16/2023   CREATININE 0.80 09/16/2023   BUN 17 09/16/2023   CO2 32 09/16/2023   TSH 2.20 03/13/2023   HGBA1C 6.0 09/16/2023       Assessment & Plan:  Routine general medical examination at a health care facility  Hyperbilirubinemia Assessment & Plan: Discussed probably normal variant. Remainder of liver panel wnl.  Follow.   Orders: -     Hepatic function panel; Future  Stress Assessment & Plan: Overall appears to be handling things relatively well.  Follow.    Osteoporosis without current pathological fracture, unspecified osteoporosis type Assessment & Plan: Receiving reclast.  Reclast 09/18/23.  Continue calcium, vitamin D and weight bearing exercise.    Hyperglycemia Assessment & Plan: Follow met b and a1c. A1c 6.0.     Hypercholesterolemia Assessment & Plan: On crestor.  Also on zetia. Follow lipid panel and liver function tests.     Health care maintenance Assessment & Plan: Physical today 09/19/23.  Colonoscopy 06/2018.  Recommended f/u in 10 years.  Mammogram 12/19/22 - birads II.  Recommended f/u diagnostic mammogram in one year.    Malignant neoplasm of upper-outer quadrant of right breast in female, estrogen receptor positive (HCC) Assessment & Plan: On arimidex and tolerating.  Receiving reclast.  Continue f/u with Dr Donneta Romberg.    Atherosclerosis of aorta [facility]) Assessment & Plan: Continue crestor.     Other orders -     Ezetimibe; Take 1 tablet (10 mg total) by mouth daily.  Dispense: 90 tablet; Refill: 3     Dale Brethren, MD

## 2023-09-19 NOTE — Assessment & Plan Note (Signed)
On arimidex and tolerating.  Receiving reclast.  Continue f/u with Dr Donneta Romberg.

## 2023-09-19 NOTE — Assessment & Plan Note (Signed)
Receiving reclast.  Reclast 09/18/23.  Continue calcium, vitamin D and weight bearing exercise.

## 2023-09-19 NOTE — Assessment & Plan Note (Signed)
Discussed probably normal variant. Remainder of liver panel wnl.  Follow.

## 2023-09-19 NOTE — Assessment & Plan Note (Signed)
Continue crestor 

## 2023-09-19 NOTE — Assessment & Plan Note (Signed)
Physical today 09/19/23.  Colonoscopy 06/2018.  Recommended f/u in 10 years.  Mammogram 12/19/22 - birads II.  Recommended f/u diagnostic mammogram in one year.

## 2023-09-19 NOTE — Assessment & Plan Note (Signed)
On crestor.  Also on zetia. Follow lipid panel and liver function tests.

## 2023-09-19 NOTE — Assessment & Plan Note (Signed)
Overall appears to be handling things relatively well.  Follow.

## 2023-09-19 NOTE — Assessment & Plan Note (Addendum)
Follow met b and a1c. A1c 6.0.

## 2023-09-23 ENCOUNTER — Ambulatory Visit: Payer: PPO

## 2023-09-23 ENCOUNTER — Ambulatory Visit: Payer: PPO | Admitting: Internal Medicine

## 2023-09-26 ENCOUNTER — Ambulatory Visit: Payer: PPO

## 2023-09-26 ENCOUNTER — Ambulatory Visit: Payer: PPO | Admitting: Internal Medicine

## 2023-10-08 DIAGNOSIS — H5213 Myopia, bilateral: Secondary | ICD-10-CM | POA: Diagnosis not present

## 2023-10-08 DIAGNOSIS — H40022 Open angle with borderline findings, high risk, left eye: Secondary | ICD-10-CM | POA: Diagnosis not present

## 2023-10-08 DIAGNOSIS — H2513 Age-related nuclear cataract, bilateral: Secondary | ICD-10-CM | POA: Diagnosis not present

## 2023-10-08 DIAGNOSIS — H43813 Vitreous degeneration, bilateral: Secondary | ICD-10-CM | POA: Diagnosis not present

## 2023-10-08 DIAGNOSIS — H52223 Regular astigmatism, bilateral: Secondary | ICD-10-CM | POA: Diagnosis not present

## 2023-10-08 DIAGNOSIS — H524 Presbyopia: Secondary | ICD-10-CM | POA: Diagnosis not present

## 2023-10-08 DIAGNOSIS — H16141 Punctate keratitis, right eye: Secondary | ICD-10-CM | POA: Diagnosis not present

## 2023-10-11 DIAGNOSIS — H524 Presbyopia: Secondary | ICD-10-CM | POA: Diagnosis not present

## 2023-10-11 DIAGNOSIS — H40022 Open angle with borderline findings, high risk, left eye: Secondary | ICD-10-CM | POA: Diagnosis not present

## 2023-10-11 DIAGNOSIS — H52223 Regular astigmatism, bilateral: Secondary | ICD-10-CM | POA: Diagnosis not present

## 2023-10-11 DIAGNOSIS — H10233 Serous conjunctivitis, except viral, bilateral: Secondary | ICD-10-CM | POA: Diagnosis not present

## 2023-10-11 DIAGNOSIS — H2513 Age-related nuclear cataract, bilateral: Secondary | ICD-10-CM | POA: Diagnosis not present

## 2023-10-11 DIAGNOSIS — H5213 Myopia, bilateral: Secondary | ICD-10-CM | POA: Diagnosis not present

## 2023-10-11 DIAGNOSIS — H43813 Vitreous degeneration, bilateral: Secondary | ICD-10-CM | POA: Diagnosis not present

## 2023-10-11 DIAGNOSIS — H16141 Punctate keratitis, right eye: Secondary | ICD-10-CM | POA: Diagnosis not present

## 2023-10-25 ENCOUNTER — Other Ambulatory Visit: Payer: Self-pay | Admitting: Surgery

## 2023-10-25 DIAGNOSIS — Z1231 Encounter for screening mammogram for malignant neoplasm of breast: Secondary | ICD-10-CM

## 2023-12-17 ENCOUNTER — Other Ambulatory Visit (INDEPENDENT_AMBULATORY_CARE_PROVIDER_SITE_OTHER): Payer: PPO

## 2023-12-18 ENCOUNTER — Other Ambulatory Visit: Payer: PPO

## 2023-12-18 LAB — HEPATIC FUNCTION PANEL
ALT: 34 U/L (ref 0–35)
AST: 19 U/L (ref 0–37)
Albumin: 4.8 g/dL (ref 3.5–5.2)
Alkaline Phosphatase: 22 U/L — ABNORMAL LOW (ref 39–117)
Bilirubin, Direct: 0.3 mg/dL (ref 0.0–0.3)
Total Bilirubin: 1.1 mg/dL (ref 0.2–1.2)
Total Protein: 7 g/dL (ref 6.0–8.3)

## 2023-12-23 ENCOUNTER — Other Ambulatory Visit: Payer: PPO

## 2023-12-24 ENCOUNTER — Ambulatory Visit
Admission: RE | Admit: 2023-12-24 | Discharge: 2023-12-24 | Disposition: A | Discharge status: Home / Self Care | Payer: PPO | Source: Ambulatory Visit | Attending: Surgery | Admitting: Surgery

## 2023-12-24 DIAGNOSIS — Z853 Personal history of malignant neoplasm of breast: Secondary | ICD-10-CM | POA: Diagnosis not present

## 2023-12-24 DIAGNOSIS — Z1231 Encounter for screening mammogram for malignant neoplasm of breast: Secondary | ICD-10-CM | POA: Diagnosis not present

## 2023-12-31 DIAGNOSIS — Z853 Personal history of malignant neoplasm of breast: Secondary | ICD-10-CM | POA: Diagnosis not present

## 2024-02-08 ENCOUNTER — Other Ambulatory Visit: Payer: Self-pay | Admitting: Internal Medicine

## 2024-03-13 ENCOUNTER — Other Ambulatory Visit: Payer: Self-pay

## 2024-03-13 DIAGNOSIS — R739 Hyperglycemia, unspecified: Secondary | ICD-10-CM

## 2024-03-13 DIAGNOSIS — E7800 Pure hypercholesterolemia, unspecified: Secondary | ICD-10-CM

## 2024-03-17 ENCOUNTER — Other Ambulatory Visit (INDEPENDENT_AMBULATORY_CARE_PROVIDER_SITE_OTHER): Payer: PPO

## 2024-03-17 DIAGNOSIS — R739 Hyperglycemia, unspecified: Secondary | ICD-10-CM | POA: Diagnosis not present

## 2024-03-17 DIAGNOSIS — E7800 Pure hypercholesterolemia, unspecified: Secondary | ICD-10-CM

## 2024-03-17 LAB — BASIC METABOLIC PANEL WITH GFR
BUN: 22 mg/dL (ref 6–23)
CO2: 33 meq/L — ABNORMAL HIGH (ref 19–32)
Calcium: 10.1 mg/dL (ref 8.4–10.5)
Chloride: 102 meq/L (ref 96–112)
Creatinine, Ser: 0.8 mg/dL (ref 0.40–1.20)
GFR: 74.11 mL/min (ref >60.00)
Glucose, Bld: 107 mg/dL — ABNORMAL HIGH (ref 70–99)
Potassium: 3.9 meq/L (ref 3.5–5.1)
Sodium: 142 meq/L (ref 135–145)

## 2024-03-17 LAB — HEPATIC FUNCTION PANEL
ALT: 27 U/L (ref 0–35)
AST: 16 U/L (ref 0–37)
Albumin: 4.5 g/dL (ref 3.5–5.2)
Alkaline Phosphatase: 24 U/L — ABNORMAL LOW (ref 39–117)
Bilirubin, Direct: 0.2 mg/dL (ref 0.0–0.3)
Total Bilirubin: 1.1 mg/dL (ref 0.2–1.2)
Total Protein: 6.6 g/dL (ref 6.0–8.3)

## 2024-03-17 LAB — CBC WITH DIFFERENTIAL/PLATELET
Basophils Absolute: 0 10*3/uL (ref 0.0–0.1)
Basophils Relative: 1.1 % (ref 0.0–3.0)
Eosinophils Absolute: 0.1 10*3/uL (ref 0.0–0.7)
Eosinophils Relative: 2.2 % (ref 0.0–5.0)
HCT: 44.7 % (ref 36.0–46.0)
Hemoglobin: 15 g/dL (ref 12.0–15.0)
Lymphocytes Relative: 20.6 % (ref 12.0–46.0)
Lymphs Abs: 0.8 10*3/uL (ref 0.7–4.0)
MCHC: 33.5 g/dL (ref 30.0–36.0)
MCV: 96.4 fl (ref 78.0–100.0)
Monocytes Absolute: 0.5 10*3/uL (ref 0.1–1.0)
Monocytes Relative: 13.4 % — ABNORMAL HIGH (ref 3.0–12.0)
Neutro Abs: 2.4 10*3/uL (ref 1.4–7.7)
Neutrophils Relative %: 62.7 % (ref 43.0–77.0)
Platelets: 189 10*3/uL (ref 150.0–400.0)
RBC: 4.64 Mil/uL (ref 3.87–5.11)
RDW: 12.4 % (ref 11.5–15.5)
WBC: 3.9 10*3/uL — ABNORMAL LOW (ref 4.0–10.5)

## 2024-03-17 LAB — LIPID PANEL
Cholesterol: 147 mg/dL (ref 0–200)
HDL: 67.3 mg/dL (ref >39.00)
LDL Cholesterol: 68 mg/dL (ref 0–99)
NonHDL: 79.36
Total CHOL/HDL Ratio: 2
Triglycerides: 57 mg/dL (ref 0.0–149.0)
VLDL: 11.4 mg/dL (ref 0.0–40.0)

## 2024-03-17 LAB — TSH: TSH: 2.63 u[IU]/mL (ref 0.35–5.50)

## 2024-03-17 LAB — HEMOGLOBIN A1C: Hgb A1c MFr Bld: 6 % (ref 4.6–6.5)

## 2024-03-18 ENCOUNTER — Encounter: Payer: Self-pay | Admitting: Internal Medicine

## 2024-03-18 ENCOUNTER — Inpatient Hospital Stay: Payer: PPO | Attending: Internal Medicine | Admitting: Internal Medicine

## 2024-03-18 DIAGNOSIS — Z170 Estrogen receptor positive status [ER+]: Secondary | ICD-10-CM | POA: Diagnosis not present

## 2024-03-18 DIAGNOSIS — Z923 Personal history of irradiation: Secondary | ICD-10-CM | POA: Diagnosis not present

## 2024-03-18 DIAGNOSIS — C50411 Malignant neoplasm of upper-outer quadrant of right female breast: Secondary | ICD-10-CM | POA: Insufficient documentation

## 2024-03-18 DIAGNOSIS — M810 Age-related osteoporosis without current pathological fracture: Secondary | ICD-10-CM | POA: Insufficient documentation

## 2024-03-18 DIAGNOSIS — Z90710 Acquired absence of both cervix and uterus: Secondary | ICD-10-CM | POA: Diagnosis not present

## 2024-03-18 DIAGNOSIS — Z79811 Long term (current) use of aromatase inhibitors: Secondary | ICD-10-CM | POA: Diagnosis not present

## 2024-03-18 DIAGNOSIS — R232 Flushing: Secondary | ICD-10-CM | POA: Insufficient documentation

## 2024-03-18 DIAGNOSIS — Z1721 Progesterone receptor positive status: Secondary | ICD-10-CM | POA: Insufficient documentation

## 2024-03-18 DIAGNOSIS — Z1732 Human epidermal growth factor receptor 2 negative status: Secondary | ICD-10-CM | POA: Diagnosis not present

## 2024-03-18 DIAGNOSIS — Z803 Family history of malignant neoplasm of breast: Secondary | ICD-10-CM | POA: Diagnosis not present

## 2024-03-18 MED ORDER — ANASTROZOLE 1 MG PO TABS: 1.0000 mg | ORAL_TABLET | Freq: Every day | ORAL | 3 refills | Status: AC

## 2024-03-18 NOTE — Progress Notes (Signed)
 Refill anastrozole, pended.  She had a mammogram 1/25.

## 2024-03-18 NOTE — Progress Notes (Signed)
 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 [s/p TR- April, 18th, 2022]  # April mid -2022- START Anastrazole   # OSTEOPOROSIS:BMD- T score= -4.1 [Fosomax- Jan 2022]; 09/20/2021- Reclast    # SURVIVORSHIP:   # GENETICS: declined  DIAGNOSIS: Right breast cancer  STAGE:   I      ;  GOALS: cure  CURRENT/MOST RECENT THERAPY : RT/AI    Carcinoma of upper-outer quadrant of right breast in female, estrogen receptor positive (HCC)  02/06/2021 Initial Diagnosis   Carcinoma of upper-outer quadrant of right breast in female, estrogen receptor positive (HCC)   02/06/2021 Cancer Staging   Staging form: Breast, AJCC 8th Edition - Pathologic: Stage IA (pT1c, pN0, cM0, G1, ER+, PR+, HER2-) - Signed by Earna Coder, MD on 02/06/2021 Stage prefix: Initial diagnosis Histologic grading system: 3 grade system Menopausal status: Postmenopausal     HISTORY OF PRESENTING ILLNESS: Alone.  Ambulating independently.  Tracy Frey 73 y.o.  female RIGHT stage I ER/PR positive HER2 negative breast cancer currently s/p adjuvant radiation-currently on anastrozole is here for follow-up.  Complains of hot flashes mild. Patient denies any worsening joint pains.  No shortness of breath or cough.  Review of Systems  Constitutional:   Negative for chills, diaphoresis, fever, malaise/fatigue and weight loss.  HENT:  Negative for nosebleeds and sore throat.   Eyes:  Negative for double vision.  Respiratory:  Negative for cough, hemoptysis, sputum production, shortness of breath and wheezing.   Cardiovascular:  Negative for chest pain, palpitations, orthopnea and leg swelling.  Gastrointestinal:  Negative for abdominal pain, blood in stool, constipation, diarrhea, heartburn, melena, nausea and vomiting.  Genitourinary:  Negative for dysuria, frequency and urgency.  Musculoskeletal:  Negative for back pain and joint pain.  Skin: Negative.  Negative for itching and rash.  Neurological:  Negative for dizziness, tingling, focal weakness, weakness and headaches.  Endo/Heme/Allergies:  Does not bruise/bleed easily.  Psychiatric/Behavioral:  Negative for depression. The patient is not nervous/anxious and does not have insomnia.      MEDICAL HISTORY:  Past Medical History:  Diagnosis Date   Hypercholesterolemia    Personal history of radiation therapy    Seasonal allergies     SURGICAL HISTORY: Past Surgical History:  Procedure Laterality Date   ABDOMINAL HYSTERECTOMY     BREAST EXCISIONAL BIOPSY Right 2003   neg   BREAST LUMPECTOMY Right    benign   BREAST LUMPECTOMY WITH SENTINEL LYMPH NODE BIOPSY Right 01/13/2021   Procedure: BREAST LUMPECTOMY WITH SENTINEL LYMPH NODE BX;  Surgeon: Earline Mayotte, MD;  Location: [facility] ORS;  Service: General;  Laterality: Right;   COLONOSCOPY WITH PROPOFOL N/A 06/25/2018   Procedure: COLONOSCOPY WITH PROPOFOL;  Surgeon: Scot Jun, MD;  Location: [facility] ENDOSCOPY;  Service: Endoscopy;  Laterality: N/A;   PARTIAL HYSTERECTOMY  1988   secondary to fibroids and heavy bleeding and endometriosis   TONSILLECTOMY  age 47    SOCIAL HISTORY: Social History   Socioeconomic History   Marital status: Married    Spouse name: Not on file   Number of children: 2   Years of  education: Not on file   Highest education level: Bachelor's degree (e.g., BA, AB, BS)  Occupational History    Employer: [employer]  Tobacco Use   Smoking status: Never   Smokeless tobacco: Never  Vaping Use   Vaping status: Never Used  Substance and Sexual Activity   Alcohol use: Yes    Alcohol/week: 0.0 standard drinks of alcohol    Comment: ocassionally   Drug use: No   Sexual activity: Not on file  Other Topics Concern   Not on file  Social History Narrative   Lives in [place] with husband; 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    Social Drivers of Corporate investment banker Strain: Low Risk  (03/16/2024)   Overall Financial Resource Strain (CARDIA)    Difficulty of Paying Living Expenses: Not hard at all  Food Insecurity: No Food Insecurity (03/16/2024)   Hunger Vital Sign    Worried About Running Out of Food in the Last Year: Never true    Ran Out of Food in the Last Year: Never true  Transportation Needs: No Transportation Needs (03/16/2024)   PRAPARE - Administrator, Civil Service (Medical): No    Lack of Transportation (Non-Medical): No  Physical Activity: Sufficiently Active (08/13/2023)   Exercise Vital Sign    Days of Exercise per Week: 5 days    Minutes of Exercise per Session: 40 min  Stress: No Stress Concern Present (03/16/2024)   [employer] of Occupational Health - Occupational Stress Questionnaire    Feeling of Stress : Not at all  Social Connections: Unknown (03/16/2024)   Social Connection and Isolation Panel [NHANES]    Frequency of Communication with Friends and Family: More than three times a week    Frequency of Social Gatherings with Friends and Family: Twice a week    Attends Religious Services: Patient declined    Database administrator or Organizations: Yes    Attends Banker Meetings: Patient declined    Marital Status: Married  Catering manager Violence: Not At Risk (08/13/2023)   Humiliation,  Afraid, Rape, and Kick questionnaire    Fear of Current or Ex-Partner: No    Emotionally Abused: No    Physically Abused: No    Sexually Abused: No    FAMILY HISTORY: Family History  Problem Relation Age of Onset   Hypercholesterolemia Mother    Hypothyroidism Sister    Hypercholesterolemia Sister    Breast cancer Maternal Aunt 48   Colon cancer Neg Hx     ALLERGIES:  is allergic to amoxicillin-pot clavulanate, chocolate, and sulfa antibiotics.  MEDICATIONS:  Current Outpatient Medications  Medication Sig Dispense Refill   Calcium Citrate-Vitamin D (CALCIUM CITRATE + D PO) Take 2 tablets by mouth 2 (two) times daily.     clindamycin-benzoyl peroxide (BENZACLIN) gel Apply 1 application topically 2 (two) times daily as needed (acne).     diphenhydrAMINE (BENADRYL) 25 MG tablet Take 25 mg by mouth at bedtime as needed for allergies.     ezetimibe (ZETIA) 10 MG tablet Take 1 tablet (10 mg total) by mouth daily. 90 tablet 3   Fiber POWD Take 1 Dose by mouth daily. 1 dose = 1 tablespoon     Glucosamine-Chondroitin (OSTEO BI-FLEX REGULAR STRENGTH PO)  Take 1 tablet by mouth daily.     Multiple Vitamins-Minerals (CENTRUM SILVER ADULT 50+) TABS Take 1 tablet by mouth daily.     Multiple Vitamins-Minerals (PRESERVISION AREDS PO) Take 1 tablet by mouth daily.     Omega-3 Fatty Acids (FISH OIL) 1000 MG CAPS Take 1,000 mg by mouth daily.     Probiotic Product (PROBIOTIC-10 PO) Take 1 capsule by mouth daily.     rosuvastatin (CRESTOR) 20 MG tablet TAKE 1 TABLET BY MOUTH EVERY DAY 90 tablet 1   tretinoin (RETIN-A) 0.05 % cream Apply topically at bedtime as needed.     anastrozole (ARIMIDEX) 1 MG tablet Take 1 tablet (1 mg total) by mouth daily. 90 tablet 3   No current facility-administered medications for this visit.      Marland Kitchen  PHYSICAL EXAMINATION: ECOG PERFORMANCE STATUS: 0 - Asymptomatic  Vitals:   03/18/24 0924  BP: (!) 145/75  Pulse: 88  Resp: 12  Temp: (!) 96.2 F (35.7 C)   SpO2: 100%     Filed Weights   03/18/24 0924  Weight: 104 lb 9.6 oz (47.4 kg)      Physical Exam HENT:     Head: Normocephalic and atraumatic.     Mouth/Throat:     Pharynx: No oropharyngeal exudate.  Eyes:     Pupils: Pupils are equal, round, and reactive to light.  Cardiovascular:     Rate and Rhythm: Normal rate and regular rhythm.  Pulmonary:     Effort: Pulmonary effort is normal. No respiratory distress.     Breath sounds: Normal breath sounds. No wheezing.  Abdominal:     General: Bowel sounds are normal. There is no distension.     Palpations: Abdomen is soft. There is no mass.     Tenderness: There is no abdominal tenderness. There is no guarding or rebound.  Musculoskeletal:        General: No tenderness. Normal range of motion.     Cervical back: Normal range of motion and neck supple.  Skin:    General: Skin is warm.  Neurological:     Mental Status: She is alert and oriented to person, place, and time.  Psychiatric:        Mood and Affect: Affect normal.      LABORATORY DATA:  I have reviewed the data as listed Lab Results  Component Value Date   WBC 3.9 (L) 03/17/2024   HGB 15.0 03/17/2024   HCT 44.7 03/17/2024   MCV 96.4 03/17/2024   PLT 189.0 03/17/2024   Recent Labs    09/16/23 0729 12/17/23 1456 03/17/24 0746  NA 139  --  142  K 3.5  --  3.9  CL 100  --  102  CO2 32  --  33*  GLUCOSE 104*  --  107*  BUN 17  --  22  CREATININE 0.80  --  0.80  CALCIUM 10.3  --  10.1  PROT 6.8 7.0 6.6  ALBUMIN 4.4 4.8 4.5  AST 17 19 16   ALT 30 34 27  ALKPHOS 28* 22* 24*  BILITOT 1.4* 1.1 1.1  BILIDIR 0.2 0.3 0.2    RADIOGRAPHIC STUDIES: I have personally reviewed the radiological images as listed and agreed with the findings in the report. No results found.  ASSESSMENT & PLAN:   Carcinoma of upper-outer quadrant of right breast in female, estrogen receptor positive [facility]) # March 2022- Right breast cancer Stage IA ER/PR positive; her 2  NEG' Oncotype- low risk.  S/p RT [03/20/2021]; JAN 2025-[Dr.Sakai] Bil Mammo-BIL.on anastrazole-stable.   # Tolerating anastrozole fairly well with minor side effects- hot flashes.  Continue anastrozole for total of 5 years [until spring 2027]- stable.   # Hot flashes-sec to anastrazole G-1- monitor for now- stable.   # OSTEOPOROSIS-T score= - 4.0 on reclast  Q62m [OCT 2022]. Continue calcium plus vitamin D. [dentist-Dr.Monahan].  Suspect acute infusion reaction rather than osteonecrosis of jaw.  Discussed regarding Tylenol/Benadryl as needed postinfusion. Stable.   # Slightly elevated Bili- clinically not significant- monitor with PCP.   Reclast q 52m; Dr.SCott-Labs-7 days prior  # DISPOSITION: # Follow up in 6 months MD- Reclast- No labs-  Dr.B    All questions were answered. The patient/family knows to call the clinic with any problems, questions or concerns.    Gwyn Leos, MD 03/18/2024 10:38 AM

## 2024-03-18 NOTE — Assessment & Plan Note (Signed)
#   March 2022- Right breast cancer Stage IA ER/PR positive; her 2 NEG' Oncotype- low risk.  S/p RT [03/20/2021]; JAN 2025-[Dr.Sakai] Bil Mammo-BIL.on anastrazole-stable.   # Tolerating anastrozole fairly well with minor side effects- hot flashes.  Continue anastrozole for total of 5 years [until spring 2027]- stable.   # Hot flashes-sec to anastrazole G-1- monitor for now- stable.   # OSTEOPOROSIS-T score= - 4.0 on reclast  Q49m [OCT 2022]. Continue calcium plus vitamin D. [dentist-Dr.Monahan].  Suspect acute infusion reaction rather than osteonecrosis of jaw.  Discussed regarding Tylenol/Benadryl as needed postinfusion. Stable.   # Slightly elevated Bili- clinically not significant- monitor with PCP.   Reclast q 70m; Dr.SCott-Labs-7 days prior  # DISPOSITION: # Follow up in 6 months MD- Reclast- No labs-  Dr.B

## 2024-03-19 ENCOUNTER — Encounter: Payer: Self-pay | Admitting: Internal Medicine

## 2024-03-19 ENCOUNTER — Ambulatory Visit (INDEPENDENT_AMBULATORY_CARE_PROVIDER_SITE_OTHER): Payer: PPO | Admitting: Internal Medicine

## 2024-03-19 ENCOUNTER — Telehealth: Payer: Self-pay | Admitting: Internal Medicine

## 2024-03-19 VITALS — BP 134/72 | HR 78 | Temp 98.0°F | Resp 16 | Ht 64.0 in | Wt 104.4 lb

## 2024-03-19 DIAGNOSIS — Z170 Estrogen receptor positive status [ER+]: Secondary | ICD-10-CM

## 2024-03-19 DIAGNOSIS — R030 Elevated blood-pressure reading, without diagnosis of hypertension: Secondary | ICD-10-CM

## 2024-03-19 DIAGNOSIS — E7800 Pure hypercholesterolemia, unspecified: Secondary | ICD-10-CM | POA: Diagnosis not present

## 2024-03-19 DIAGNOSIS — R739 Hyperglycemia, unspecified: Secondary | ICD-10-CM

## 2024-03-19 DIAGNOSIS — C50411 Malignant neoplasm of upper-outer quadrant of right female breast: Secondary | ICD-10-CM | POA: Diagnosis not present

## 2024-03-19 DIAGNOSIS — I700 Atherosclerosis of aorta: Secondary | ICD-10-CM

## 2024-03-19 DIAGNOSIS — M810 Age-related osteoporosis without current pathological fracture: Secondary | ICD-10-CM | POA: Diagnosis not present

## 2024-03-19 DIAGNOSIS — F439 Reaction to severe stress, unspecified: Secondary | ICD-10-CM

## 2024-03-19 NOTE — Telephone Encounter (Signed)
 Pt is scheduled for 10/17 6 months MD- Reclast.   She has her pcp labs on 11/3 and then see her pcp on 11/5. (Due to insurance it has to be yearly and not before then for the annual visit)   Pt wants to know if she can move her 10/17 appt to after 11/5 or is that too far out to schedule the reclast.

## 2024-03-19 NOTE — Progress Notes (Signed)
 Subjective:    Patient ID: Pleas Brill, female    DOB: 1951-03-30, 73 y.o.   MRN: [id number]  Patient here for  Chief Complaint  Patient presents with   Medical Management of Chronic Issues    HPI Here for a scheduled follow up - follow up regarding hypercholesterolemia and hyperglycemia. Also has osteoporosis. Receiving reclast . Last 09/18/23. Had f/u with surgery 12/31/23 - stable. Last mammogram 12/25/23. Recommended one year f/u. Had f/u with Dr Valentine Gasmen yesterday - tolerating anastrozole  (continue until spring 2027). She is trying to stay active. Walking - on average 3 miles per day. No chest pain or sob reported. No abdominal pain or bowel change reported. Had questions regarding labs and vitamin D.    Past Medical History:  Diagnosis Date   Hypercholesterolemia    Personal history of radiation therapy    Seasonal allergies    Past Surgical History:  Procedure Laterality Date   ABDOMINAL HYSTERECTOMY     BREAST EXCISIONAL BIOPSY Right 2003   neg   BREAST LUMPECTOMY Right    benign   BREAST LUMPECTOMY WITH SENTINEL LYMPH NODE BIOPSY Right 01/13/2021   Procedure: BREAST LUMPECTOMY WITH SENTINEL LYMPH NODE BX;  Surgeon: Marshall Skeeter, MD;  Location: [facility] ORS;  Service: General;  Laterality: Right;   COLONOSCOPY WITH PROPOFOL  N/A 06/25/2018   Procedure: COLONOSCOPY WITH PROPOFOL ;  Surgeon: Cassie Click, MD;  Location: [facility] ENDOSCOPY;  Service: Endoscopy;  Laterality: N/A;   PARTIAL HYSTERECTOMY  1988   secondary to fibroids and heavy bleeding and endometriosis   TONSILLECTOMY     age 41   Family History  Problem Relation Age of Onset   Hypercholesterolemia Mother    Hypothyroidism Sister    Hypercholesterolemia Sister    Breast cancer Maternal Aunt 20   Colon cancer Neg Hx    Social History   Socioeconomic History   Marital status: Married    Spouse name: Not on file   Number of children: 2   Years of education: Not on file   Highest education level:  Bachelor's degree (e.g., BA, AB, BS)  Occupational History    Employer: [employer]  Tobacco Use   Smoking status: Never   Smokeless tobacco: Never  Vaping Use   Vaping status: Never Used  Substance and Sexual Activity   Alcohol use: Yes    Alcohol/week: 0.0 standard drinks of alcohol    Comment: ocassionally   Drug use: No   Sexual activity: Not on file  Other Topics Concern   Not on file  Social History Narrative   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    Social Drivers of Corporate investment banker Strain: Low Risk  (03/16/2024)   Overall Financial Resource Strain (CARDIA)    Difficulty of Paying Living Expenses: Not hard at all  Food Insecurity: No Food Insecurity (03/16/2024)   Hunger Vital Sign    Worried About Running Out of Food in the Last Year: Never true    Ran Out of Food in the Last Year: Never true  Transportation Needs: No Transportation Needs (03/16/2024)   PRAPARE - Administrator, Civil Service (Medical): No    Lack of Transportation (Non-Medical): No  Physical Activity: Sufficiently Active (08/13/2023)   Exercise Vital Sign    Days of Exercise per Week: 5 days    Minutes of Exercise per Session: 40 min  Stress: No Stress Concern Present (03/16/2024)   [place]  [employer] of Occupational Health - Occupational Stress Questionnaire    Feeling of Stress : Not at all  Social Connections: Unknown (03/16/2024)   Social Connection and Isolation Panel [NHANES]    Frequency of Communication with Friends and Family: More than three times a week    Frequency of Social Gatherings with Friends and Family: Twice a week    Attends Religious Services: Patient declined    Database administrator or Organizations: Yes    Attends Banker Meetings: Patient declined    Marital Status: Married     Review of Systems  Constitutional:  Negative for appetite change and unexpected weight change.  HENT:  Negative  for congestion and sinus pressure.   Respiratory:  Negative for cough, chest tightness and shortness of breath.   Cardiovascular:  Negative for chest pain, palpitations and leg swelling.  Gastrointestinal:  Negative for abdominal pain, diarrhea, nausea and vomiting.  Genitourinary:  Negative for difficulty urinating and dysuria.  Musculoskeletal:  Negative for joint swelling and myalgias.  Skin:  Negative for color change and rash.  Neurological:  Negative for dizziness and headaches.  Psychiatric/Behavioral:  Negative for agitation and dysphoric mood.        Objective:     BP 134/72   Pulse 78   Temp 98 F (36.7 C)   Resp 16   Ht 5\' 4"  (1.626 m)   Wt 104 lb 6.4 oz (47.4 kg)   SpO2 99%   BMI 17.92 kg/m  Wt Readings from Last 3 Encounters:  03/19/24 104 lb 6.4 oz (47.4 kg)  03/18/24 104 lb 9.6 oz (47.4 kg)  09/19/23 106 lb (48.1 kg)    Physical Exam Vitals reviewed.  Constitutional:      General: She is not in acute distress.    Appearance: Normal appearance.  HENT:     Head: Normocephalic and atraumatic.     Right Ear: External ear normal.     Left Ear: External ear normal.     Mouth/Throat:     Pharynx: No oropharyngeal exudate or posterior oropharyngeal erythema.  Eyes:     General: No scleral icterus.       Right eye: No discharge.        Left eye: No discharge.     Conjunctiva/sclera: Conjunctivae normal.  Neck:     Thyroid : No thyromegaly.  Cardiovascular:     Rate and Rhythm: Normal rate and regular rhythm.  Pulmonary:     Effort: No respiratory distress.     Breath sounds: Normal breath sounds. No wheezing.  Abdominal:     General: Bowel sounds are normal.     Palpations: Abdomen is soft.     Tenderness: There is no abdominal tenderness.  Musculoskeletal:        General: No swelling or tenderness.     Cervical back: Neck supple. No tenderness.  Lymphadenopathy:     Cervical: No cervical adenopathy.  Skin:    Findings: No erythema or rash.   Neurological:     Mental Status: She is alert.  Psychiatric:        Mood and Affect: Mood normal.        Behavior: Behavior normal.         Outpatient Encounter Medications as of 03/19/2024  Medication Sig   anastrozole  (ARIMIDEX ) 1 MG tablet Take 1 tablet (1 mg total) by mouth daily.   Calcium  Citrate-Vitamin D (CALCIUM  CITRATE + D PO) Take 2 tablets by mouth 2 (two) times  daily.   clindamycin-benzoyl peroxide (BENZACLIN) gel Apply 1 application topically 2 (two) times daily as needed (acne).   diphenhydrAMINE (BENADRYL) 25 MG tablet Take 25 mg by mouth at bedtime as needed for allergies.   ezetimibe  (ZETIA ) 10 MG tablet Take 1 tablet (10 mg total) by mouth daily.   Fiber POWD Take 1 Dose by mouth daily. 1 dose = 1 tablespoon   Glucosamine-Chondroitin (OSTEO BI-FLEX REGULAR STRENGTH PO) Take 1 tablet by mouth daily.   Multiple Vitamins-Minerals (CENTRUM SILVER ADULT 50+) TABS Take 1 tablet by mouth daily.   Multiple Vitamins-Minerals (PRESERVISION AREDS PO) Take 1 tablet by mouth daily.   Omega-3 Fatty Acids (FISH OIL) 1000 MG CAPS Take 1,000 mg by mouth daily.   Probiotic Product (PROBIOTIC-10 PO) Take 1 capsule by mouth daily.   rosuvastatin  (CRESTOR ) 20 MG tablet TAKE 1 TABLET BY MOUTH EVERY DAY   tretinoin (RETIN-A) 0.05 % cream Apply topically at bedtime as needed.   No facility-administered encounter medications on file as of 03/19/2024.     Lab Results  Component Value Date   WBC 3.9 (L) 03/17/2024   HGB 15.0 03/17/2024   HCT 44.7 03/17/2024   PLT 189.0 03/17/2024   GLUCOSE 107 (H) 03/17/2024   CHOL 147 03/17/2024   TRIG 57.0 03/17/2024   HDL 67.30 03/17/2024   LDLDIRECT 168.1 04/17/2013   LDLCALC 68 03/17/2024   ALT 27 03/17/2024   AST 16 03/17/2024   NA 142 03/17/2024   K 3.9 03/17/2024   CL 102 03/17/2024   CREATININE 0.80 03/17/2024   BUN 22 03/17/2024   CO2 33 (H) 03/17/2024   TSH 2.63 03/17/2024   HGBA1C 6.0 03/17/2024    MM 3D SCREENING MAMMOGRAM  BILATERAL BREAST Result Date: 12/25/2023 CLINICAL DATA:  Screening. History of treated right breast cancer, status post breast conservation therapy. EXAM: DIGITAL SCREENING BILATERAL MAMMOGRAM WITH TOMOSYNTHESIS AND CAD TECHNIQUE: Bilateral screening digital craniocaudal and mediolateral oblique mammograms were obtained. Bilateral screening digital breast tomosynthesis was performed. The images were evaluated with computer-aided detection. COMPARISON:  Previous exam(s). ACR Breast Density Category d: The breasts are extremely dense, which lowers the sensitivity of mammography. FINDINGS: There are no findings suspicious for malignancy. IMPRESSION: No mammographic evidence of malignancy. A result letter of this screening mammogram will be mailed directly to the patient. RECOMMENDATION: Screening mammogram in one year. (Code:SM-B-01Y) BI-RADS CATEGORY  1: Negative. Electronically Signed   By: Dobrinka  Dimitrova M.D.   On: 12/25/2023 11:47       Assessment & Plan:  Osteoporosis without current pathological fracture, unspecified osteoporosis type Assessment & Plan: Receiving reclast .  Reclast  09/18/23.  Continue calcium  and weight bearing exercise.   Orders: -     VITAMIN D 25 Hydroxy (Vit-D Deficiency, Fractures); Future  Hypercholesterolemia Assessment & Plan: Continues on crestor  and zetia . Follow lipid panel and liver function tests.   Lab Results  Component Value Date   CHOL 147 03/17/2024   HDL 67.30 03/17/2024   LDLCALC 68 03/17/2024   LDLDIRECT 168.1 04/17/2013   TRIG 57.0 03/17/2024   CHOLHDL 2 03/17/2024     Orders: -     Hepatic function panel; Future -     Basic metabolic panel with GFR; Future -     Lipid panel; Future  Hyperglycemia Assessment & Plan: Low carb diet and exercise. Follow met b and A1c.   Orders: -     Hemoglobin A1c; Future  Atherosclerosis of aorta [facility]) Assessment & Plan: Continue crestor .  Malignant neoplasm of upper-outer quadrant of right  breast in female, estrogen receptor positive (HCC) Assessment & Plan: On arimidex  and tolerating.  Receiving reclast .  Continue f/u with Dr Valentine Gasmen. Just evaluated. Stable. Tolerating anastrozole  (continue until spring 2027).    Elevated blood pressure reading Assessment & Plan: Blood pressure - ok on recent checks. Follow pressures.    Stress Assessment & Plan: Overall appears to be handling things relatively well.  Follow.       Dellar Fenton, MD

## 2024-03-21 ENCOUNTER — Encounter: Payer: Self-pay | Admitting: Internal Medicine

## 2024-03-21 NOTE — Assessment & Plan Note (Signed)
 On arimidex  and tolerating.  Receiving reclast .  Continue f/u with Dr Valentine Gasmen. Just evaluated. Stable. Tolerating anastrozole  (continue until spring 2027).

## 2024-03-21 NOTE — Assessment & Plan Note (Signed)
 Continue crestor

## 2024-03-21 NOTE — Assessment & Plan Note (Signed)
 Low-carb diet and exercise.  Follow met b and A1c.

## 2024-03-21 NOTE — Assessment & Plan Note (Signed)
Overall appears to be handling things relatively well.  Follow.   

## 2024-03-21 NOTE — Assessment & Plan Note (Signed)
 Continues on crestor  and zetia . Follow lipid panel and liver function tests.   Lab Results  Component Value Date   CHOL 147 03/17/2024   HDL 67.30 03/17/2024   LDLCALC 68 03/17/2024   LDLDIRECT 168.1 04/17/2013   TRIG 57.0 03/17/2024   CHOLHDL 2 03/17/2024

## 2024-03-21 NOTE — Assessment & Plan Note (Signed)
 Receiving reclast .  Reclast  09/18/23.  Continue calcium  and weight bearing exercise.

## 2024-03-21 NOTE — Assessment & Plan Note (Signed)
 Blood pressure - ok on recent checks. Follow pressures.

## 2024-03-31 ENCOUNTER — Telehealth: Payer: Self-pay | Admitting: *Deleted

## 2024-03-31 NOTE — Telephone Encounter (Signed)
 Patient called to discuss her apt schedule in November. She wanted to confirm her apts were chg to Nov. I confirmed this change with patient. She wanted Dr. B to be aware that she is on citracal daily. She noted that Dr. Geralyn Knee will be checking her vit d levels in the fall. Pcp gave pt the option to have the vit d level checked and pt to be restuck at last visit. Pt opted at the time to wait until the fall. Pt inquired the date of her last bone density. Last bone density was done in 12/08/2020. Pt is overdue for the bone density. She stated she would reach out to pcp to have this scheduled.  Pt thanked me for discussing her care.

## 2024-06-01 DIAGNOSIS — H2512 Age-related nuclear cataract, left eye: Secondary | ICD-10-CM | POA: Diagnosis not present

## 2024-06-01 DIAGNOSIS — H40003 Preglaucoma, unspecified, bilateral: Secondary | ICD-10-CM | POA: Diagnosis not present

## 2024-06-01 DIAGNOSIS — H43813 Vitreous degeneration, bilateral: Secondary | ICD-10-CM | POA: Diagnosis not present

## 2024-06-01 DIAGNOSIS — H2513 Age-related nuclear cataract, bilateral: Secondary | ICD-10-CM | POA: Diagnosis not present

## 2024-06-11 ENCOUNTER — Encounter: Payer: Self-pay | Admitting: Internal Medicine

## 2024-06-11 NOTE — Telephone Encounter (Signed)
 Reviewed. I am ok with bone density if she is agreeable to schedule.

## 2024-06-12 NOTE — Telephone Encounter (Signed)
 Patient aware of below.

## 2024-06-12 NOTE — Telephone Encounter (Signed)
 Please call and notify her that I am ok if she wants to get through Dr Damaris office.  Also, we can discuss taking tumeric (or any other supplements) at her appt.

## 2024-06-23 ENCOUNTER — Other Ambulatory Visit: Payer: Self-pay | Admitting: Internal Medicine

## 2024-08-11 ENCOUNTER — Ambulatory Visit

## 2024-08-27 ENCOUNTER — Telehealth: Payer: Self-pay

## 2024-08-27 ENCOUNTER — Telehealth: Payer: Self-pay | Admitting: *Deleted

## 2024-08-27 NOTE — Telephone Encounter (Signed)
Pt aware of below.

## 2024-08-27 NOTE — Telephone Encounter (Signed)
 The patient called in saying that her granddaughter has the exposed to her the hand-foot and mouth disease and they are supposed to go over today to check on them and see how they are doing.  She asked should she okay to go over there or not.  Told her that with her being on anastrozole  does not limit you from going over there and then she said that she can get things quickly.  I told her that you know she can do it if she wants to it is up to her but she says she is going to be coming in November to get something and then she is going to be getting her flu shot.  She thought maybe she should not be taking going around anybody until she gets her flu shot. She is not going to see the grand kid until she gets better

## 2024-08-27 NOTE — Telephone Encounter (Signed)
 Please call her and let her know that if they are actively infected, would just hold on visiting.

## 2024-08-27 NOTE — Telephone Encounter (Signed)
 Copied from CRM #[id number]. Topic: Clinical - Medical Advice >> Aug 27, 2024 10:37 AM Timindy P wrote: Reason for CRM: Patient is calling to get advice from Dr. Freda RN regarding going to help out with her grandchildren ho have active cases of hand foot and mouth. Patient is stating she is in her 3rd year of breast cancer recovery and is immunocompromised. She is to leave her house around 3 to go and help but she would like a 2nd opinion on wether she should go or not due to her immunity. Patient is asking for a call back at [phone]

## 2024-09-10 ENCOUNTER — Ambulatory Visit (INDEPENDENT_AMBULATORY_CARE_PROVIDER_SITE_OTHER)

## 2024-09-10 DIAGNOSIS — Z23 Encounter for immunization: Secondary | ICD-10-CM

## 2024-09-17 ENCOUNTER — Other Ambulatory Visit: Payer: Self-pay | Admitting: Internal Medicine

## 2024-09-18 ENCOUNTER — Ambulatory Visit

## 2024-09-18 ENCOUNTER — Ambulatory Visit: Admitting: Internal Medicine

## 2024-09-28 ENCOUNTER — Telehealth: Payer: Self-pay | Admitting: *Deleted

## 2024-09-28 NOTE — Telephone Encounter (Signed)
 Patient said that she had heard that there was a mebane and she was thinking that the medicine for her breast cancer had statin and it but it is not a statin because she is on anastrozole .  When I called her back to tell her about it she said that she was not thinking because she was getting ready to go to a wedding and she was stuffing everything in the car and she called the pharmacist and they told her the same thing.

## 2024-10-05 ENCOUNTER — Other Ambulatory Visit

## 2024-10-07 ENCOUNTER — Encounter: Admitting: Internal Medicine

## 2024-10-08 ENCOUNTER — Other Ambulatory Visit (INDEPENDENT_AMBULATORY_CARE_PROVIDER_SITE_OTHER)

## 2024-10-08 DIAGNOSIS — M810 Age-related osteoporosis without current pathological fracture: Secondary | ICD-10-CM

## 2024-10-08 DIAGNOSIS — E7800 Pure hypercholesterolemia, unspecified: Secondary | ICD-10-CM | POA: Diagnosis not present

## 2024-10-08 DIAGNOSIS — R739 Hyperglycemia, unspecified: Secondary | ICD-10-CM

## 2024-10-08 LAB — HEPATIC FUNCTION PANEL
ALT: 27 U/L (ref 0–35)
AST: 18 U/L (ref 0–37)
Albumin: 4.5 g/dL (ref 3.5–5.2)
Alkaline Phosphatase: 27 U/L — ABNORMAL LOW (ref 39–117)
Bilirubin, Direct: 0.2 mg/dL (ref 0.0–0.3)
Total Bilirubin: 1.3 mg/dL — ABNORMAL HIGH (ref 0.2–1.2)
Total Protein: 6.8 g/dL (ref 6.0–8.3)

## 2024-10-08 LAB — HEMOGLOBIN A1C: Hgb A1c MFr Bld: 6.1 % (ref 4.6–6.5)

## 2024-10-08 LAB — LIPID PANEL
Cholesterol: 150 mg/dL (ref 0–200)
HDL: 70.4 mg/dL (ref >39.00)
LDL Cholesterol: 66 mg/dL (ref 0–99)
NonHDL: 79.12
Total CHOL/HDL Ratio: 2
Triglycerides: 67 mg/dL (ref 0.0–149.0)
VLDL: 13.4 mg/dL (ref 0.0–40.0)

## 2024-10-08 LAB — BASIC METABOLIC PANEL WITH GFR
BUN: 15 mg/dL (ref 6–23)
CO2: 32 meq/L (ref 19–32)
Calcium: 10.2 mg/dL (ref 8.4–10.5)
Chloride: 99 meq/L (ref 96–112)
Creatinine, Ser: 0.74 mg/dL (ref 0.40–1.20)
GFR: 81.06 mL/min (ref >60.00)
Glucose, Bld: 105 mg/dL — ABNORMAL HIGH (ref 70–99)
Potassium: 3.8 meq/L (ref 3.5–5.1)
Sodium: 140 meq/L (ref 135–145)

## 2024-10-08 LAB — VITAMIN D 25 HYDROXY (VIT D DEFICIENCY, FRACTURES): VITD: 71.28 ng/mL (ref 30.00–100.00)

## 2024-10-09 ENCOUNTER — Ambulatory Visit: Payer: Self-pay | Admitting: Internal Medicine

## 2024-10-12 ENCOUNTER — Inpatient Hospital Stay

## 2024-10-12 ENCOUNTER — Encounter: Payer: Self-pay | Admitting: Internal Medicine

## 2024-10-12 ENCOUNTER — Inpatient Hospital Stay: Attending: Internal Medicine | Admitting: Internal Medicine

## 2024-10-12 ENCOUNTER — Inpatient Hospital Stay: Admitting: Nurse Practitioner

## 2024-10-12 VITALS — BP 155/69 | HR 84 | Temp 97.0°F | Ht 64.0 in | Wt 103.0 lb

## 2024-10-12 VITALS — BP 159/77 | HR 70

## 2024-10-12 DIAGNOSIS — Z1732 Human epidermal growth factor receptor 2 negative status: Secondary | ICD-10-CM | POA: Insufficient documentation

## 2024-10-12 DIAGNOSIS — Z803 Family history of malignant neoplasm of breast: Secondary | ICD-10-CM | POA: Diagnosis not present

## 2024-10-12 DIAGNOSIS — T801XXA Vascular complications following infusion, transfusion and therapeutic injection, initial encounter: Secondary | ICD-10-CM

## 2024-10-12 DIAGNOSIS — Z923 Personal history of irradiation: Secondary | ICD-10-CM | POA: Insufficient documentation

## 2024-10-12 DIAGNOSIS — Z170 Estrogen receptor positive status [ER+]: Secondary | ICD-10-CM | POA: Insufficient documentation

## 2024-10-12 DIAGNOSIS — R232 Flushing: Secondary | ICD-10-CM | POA: Insufficient documentation

## 2024-10-12 DIAGNOSIS — Z1231 Encounter for screening mammogram for malignant neoplasm of breast: Secondary | ICD-10-CM | POA: Diagnosis not present

## 2024-10-12 DIAGNOSIS — Z79811 Long term (current) use of aromatase inhibitors: Secondary | ICD-10-CM | POA: Insufficient documentation

## 2024-10-12 DIAGNOSIS — C50411 Malignant neoplasm of upper-outer quadrant of right female breast: Secondary | ICD-10-CM | POA: Insufficient documentation

## 2024-10-12 DIAGNOSIS — M810 Age-related osteoporosis without current pathological fracture: Secondary | ICD-10-CM | POA: Diagnosis not present

## 2024-10-12 DIAGNOSIS — Z1721 Progesterone receptor positive status: Secondary | ICD-10-CM | POA: Insufficient documentation

## 2024-10-12 MED ORDER — SODIUM CHLORIDE 0.9 % IV SOLN
Freq: Once | INTRAVENOUS | Status: AC
  Filled 2024-10-12: qty 250

## 2024-10-12 MED ORDER — ZOLEDRONIC ACID 5 MG/100ML IV SOLN
5.0000 mg | Freq: Once | INTRAVENOUS | Status: AC
  Administered 2024-10-12: 5 mg via INTRAVENOUS
  Filled 2024-10-12: qty 100

## 2024-10-12 NOTE — Progress Notes (Signed)
 1559: swelling noted at  left Forearm PIV site. Reclast  stopped, PIV removed and new left AC PIV started and Reclast  restarted in left AC PIV.  1620: Tinnie Dawn NP at chairside to assess. Cold compress applied per pharmacy. Tinnie Dawn NP reviewed plan with pt, pt educated cold compress can be applied for 20 minutes (barrier between compress and skin) at a time 4 times a day, and to call clinic if symptoms worsens or new symptoms develop. Pt verbalizes understanding.  1645: Pt stable at discharge

## 2024-10-12 NOTE — Progress Notes (Unsigned)
 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 [s/p TR- April, 18th, 2022]  # April mid -2022- START Anastrazole   # OSTEOPOROSIS:BMD- T score= -4.1 [Fosomax- Jan 2022]; 09/20/2021- Reclast     # SURVIVORSHIP:   # GENETICS: declined  DIAGNOSIS: Right breast cancer  STAGE:   I      ;  GOALS: cure  CURRENT/MOST RECENT THERAPY : RT/AI    Carcinoma of upper-outer quadrant of right breast in female, estrogen receptor positive (HCC)  02/06/2021 Initial Diagnosis   Carcinoma of upper-outer quadrant of right breast in female, estrogen receptor positive (HCC)   02/06/2021 Cancer Staging   Staging form: Breast, AJCC 8th Edition - Pathologic: Stage IA (pT1c, pN0, cM0, G1, ER+, PR+, HER2-) - Signed by Rennie Cindy SAUNDERS, MD on 02/06/2021 Stage prefix: Initial diagnosis Histologic grading system: 3 grade system Menopausal status: Postmenopausal     HISTORY OF PRESENTING ILLNESS: Alone.  Ambulating independently.  Tracy Frey 73 y.o.  female RIGHT stage I ER/PR positive HER2 negative breast cancer currently s/p adjuvant radiation-currently on anastrozole  is here for follow-up/ and to proceed with reclast .   Discussed the use of AI scribe software for clinical note transcription with the patient, who gave verbal consent to  proceed.  History of Present Illness   Tracy Frey is a 73 year old female with breast cancer who presents for follow-up.  She is undergoing treatment with anastrozole  and reports no significant issues with the medication, aside from minor adjustments. She experiences hot flashes primarily upon waking, though her frequency has decreased over time.  Her last mammogram was satisfactory, and she underwent a 3D mammogram. She expressed concern about a previous examination where something was felt, which she attributed to scar tissue. However, she acknowledges the effectiveness of the 3D mammogram.  She noted that her blood pressure was up a little bit when she came in.  She has not had recent dental work and plans a dental visit in February. Her last bone density test was in 2022, followed by Reclast  treatment. She postponed her usual October bone density test due to an out-of-town wedding, rescheduling it for November. Her mammogram is scheduled for January, and she is considering having her bone density test done at the same time.       Review of Systems  Constitutional:  Negative for chills, diaphoresis, fever, malaise/fatigue and weight loss.  HENT:  Negative for nosebleeds and sore throat.   Eyes:  Negative for double vision.  Respiratory:  Negative for cough, hemoptysis, sputum production, shortness of breath and wheezing.   Cardiovascular:  Negative for chest pain, palpitations, orthopnea and leg swelling.  Gastrointestinal:  Negative for abdominal pain, blood in stool, constipation, diarrhea, heartburn, melena, nausea and vomiting.  Genitourinary:  Negative for dysuria, frequency and urgency.  Musculoskeletal:  Negative for back pain and joint pain.  Skin: Negative.  Negative for itching and rash.  Neurological:  Negative for dizziness, tingling, focal weakness, weakness and headaches.  Endo/Heme/Allergies:  Does not bruise/bleed easily.  Psychiatric/Behavioral:  Negative  for depression. The patient is not nervous/anxious and does not have insomnia.      MEDICAL HISTORY:  Past Medical History:  Diagnosis Date   Allergy 2006 and before   Sulfur/chocolate   Cancer (HCC) 2022   Hypercholesterolemia    Personal history of radiation therapy    Seasonal allergies     SURGICAL HISTORY: Past Surgical History:  Procedure Laterality Date   ABDOMINAL HYSTERECTOMY     BREAST EXCISIONAL BIOPSY Right 2003   neg   BREAST LUMPECTOMY Right    benign   BREAST LUMPECTOMY WITH SENTINEL LYMPH NODE BIOPSY Right 01/13/2021   Procedure: BREAST LUMPECTOMY WITH SENTINEL LYMPH NODE BX;  Surgeon: Dessa Reyes ORN, MD;  Location: [facility] ORS;  Service: General;  Laterality: Right;   COLONOSCOPY WITH PROPOFOL  N/A 06/25/2018   Procedure: COLONOSCOPY WITH PROPOFOL ;  Surgeon: Viktoria Lamar DASEN, MD;  Location: [facility] ENDOSCOPY;  Service: Endoscopy;  Laterality: N/A;   PARTIAL HYSTERECTOMY  1988   secondary to fibroids and heavy bleeding and endometriosis   TONSILLECTOMY     age 29    SOCIAL HISTORY: Social History   Socioeconomic History   Marital status: Married    Spouse name: Not on file   Number of children: 2   Years of education: Not on file   Highest education level: Bachelor's degree (e.g., BA, AB, BS)  Occupational History    Employer: [employer]  Tobacco Use   Smoking status: Never   Smokeless tobacco: Never  Vaping Use   Vaping status: Never Used  Substance and Sexual Activity   Alcohol use: Yes    Alcohol/week: 0.0 standard drinks of alcohol    Comment: ocassionally   Drug use: No   Sexual activity: Not on file  Other Topics Concern   Not on file  Social History Narrative   Lives in  with husband; 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    Social Drivers of Corporate Investment Banker Strain: Low Risk  (03/16/2024)   Overall Financial Resource Strain (CARDIA)    Difficulty of Paying Living Expenses: Not hard at all  Food  Insecurity: No Food Insecurity (03/16/2024)   Hunger Vital Sign    Worried About Running Out of Food in the Last Year: Never true    Ran Out of Food in the Last Year: Never true  Transportation Needs: No Transportation Needs (03/16/2024)   PRAPARE - Administrator, Civil Service (Medical): No    Lack of Transportation (Non-Medical): No  Physical Activity: Sufficiently Active (08/13/2023)   Exercise Vital Sign    Days of Exercise per Week: 5 days    Minutes of Exercise per Session: 40 min  Stress: No Stress Concern Present (03/16/2024)   [employer] of Occupational Health - Occupational Stress Questionnaire    Feeling of Stress : Not at all  Social Connections: Unknown (03/16/2024)   Social Connection and Isolation Panel    Frequency of Communication with Friends and Family: More than three times a week    Frequency of Social Gatherings with Friends and Family: Twice a week    Attends Religious Services: Patient declined    Active Member of Clubs or Organizations: Yes    Attends Banker Meetings: Patient declined    Marital Status: Married  Catering Manager Violence: Not At Risk (08/13/2023)  Humiliation, Afraid, Rape, and Kick questionnaire    Fear of Current or Ex-Partner: No    Emotionally Abused: No    Physically Abused: No    Sexually Abused: No    FAMILY HISTORY: Family History  Problem Relation Age of Onset   Hypercholesterolemia Mother    Hypothyroidism Sister    Hypercholesterolemia Sister    Breast cancer Maternal Aunt 6   Varicose Veins Sister        Spider veins   Colon cancer Neg Hx     ALLERGIES:  is allergic to amoxicillin -pot clavulanate, chocolate, and sulfa antibiotics.  MEDICATIONS:  Current Outpatient Medications  Medication Sig Dispense Refill   anastrozole  (ARIMIDEX ) 1 MG tablet Take 1 tablet (1 mg total) by mouth daily. 90 tablet 3   Calcium  Citrate-Vitamin D (CALCIUM  CITRATE + D PO) Take 2 tablets by mouth 2 (two) times  daily.     Cholecalciferol (VITAMIN D3) 1000 units CAPS Take 2.5 mcg by mouth daily.     clindamycin-benzoyl peroxide (BENZACLIN) gel Apply 1 application topically 2 (two) times daily as needed (acne).     ezetimibe  (ZETIA ) 10 MG tablet TAKE 1 TABLET BY MOUTH EVERY DAY 90 tablet 3   Fiber POWD Take 1 Dose by mouth daily. 1 dose = 1 tablespoon     Multiple Vitamins-Minerals (CENTRUM SILVER ADULT 50+) TABS Take 1 tablet by mouth daily.     Multiple Vitamins-Minerals (PRESERVISION AREDS PO) Take 1 tablet by mouth daily.     Probiotic Product (PROBIOTIC-10 PO) Take 1 capsule by mouth daily.     tretinoin (RETIN-A) 0.05 % cream Apply topically at bedtime as needed.     rosuvastatin  (CRESTOR ) 20 MG tablet Take 1 tablet (20 mg total) by mouth daily. 90 tablet 1   No current facility-administered medications for this visit.      SABRA  PHYSICAL EXAMINATION: ECOG PERFORMANCE STATUS: 0 - Asymptomatic  Vitals:   10/12/24 1449 10/12/24 1508  BP: (!) 164/83 (!) 155/69  Pulse: 84   Temp: (!) 97 F (36.1 C)   SpO2: 96%      Filed Weights   10/12/24 1449  Weight: 103 lb (46.7 kg)      Physical Exam HENT:     Head: Normocephalic and atraumatic.     Mouth/Throat:     Pharynx: No oropharyngeal exudate.  Eyes:     Pupils: Pupils are equal, round, and reactive to light.  Cardiovascular:     Rate and Rhythm: Normal rate and regular rhythm.  Pulmonary:     Effort: Pulmonary effort is normal. No respiratory distress.     Breath sounds: Normal breath sounds. No wheezing.  Abdominal:     General: Bowel sounds are normal. There is no distension.     Palpations: Abdomen is soft. There is no mass.     Tenderness: There is no abdominal tenderness. There is no guarding or rebound.  Musculoskeletal:        General: No tenderness. Normal range of motion.     Cervical back: Normal range of motion and neck supple.  Skin:    General: Skin is warm.  Neurological:     Mental Status: She is alert and  oriented to person, place, and time.  Psychiatric:        Mood and Affect: Affect normal.      LABORATORY DATA:  I have reviewed the data as listed Lab Results  Component Value Date   WBC 3.9 (L) 03/17/2024  HGB 15.0 03/17/2024   HCT 44.7 03/17/2024   MCV 96.4 03/17/2024   PLT 189.0 03/17/2024   Recent Labs    12/17/23 1456 03/17/24 0746 10/08/24 0727  NA  --  142 140  K  --  3.9 3.8  CL  --  102 99  CO2  --  33* 32  GLUCOSE  --  107* 105*  BUN  --  22 15  CREATININE  --  0.80 0.74  CALCIUM   --  10.1 10.2  PROT 7.0 6.6 6.8  ALBUMIN 4.8 4.5 4.5  AST 19 16 18   ALT 34 27 27  ALKPHOS 22* 24* 27*  BILITOT 1.1 1.1 1.3*  BILIDIR 0.3 0.2 0.2    RADIOGRAPHIC STUDIES: I have personally reviewed the radiological images as listed and agreed with the findings in the report. No results found.  ASSESSMENT & PLAN:   Carcinoma of upper-outer quadrant of right breast in female, estrogen receptor positive [facility]) # March 2022- Right breast cancer Stage IA ER/PR positive; her 2 NEG' Oncotype- low risk.  S/p RT [03/20/2021]; JAN 2025-[Dr.Sakai] Bil Mammo-BIL.on anastrazole-stable.   # Tolerating anastrozole  fairly well with minor side effects- hot flashes.  Continue anastrozole  for total of 5 years [until spring 2027]- stable.   # Hot flashes-sec to anastrazole G-1- monitor for now- stable.   # OSTEOPOROSIS-T score= - 4.0 on reclast   Q3m [OCT 2022]. Continue calcium  plus vitamin D. [dentist-Dr.Monahan].  Suspect acute infusion reaction rather than osteonecrosis of jaw.  Discussed regarding Tylenol /Benadryl as needed postinfusion. Stable. Will order BMD with next mammogram-   Reclast  q 104m; Dr.SCott-Labs-7 days prior Mychart-BMD # DISPOSITION: # Reclast  today # in jan 2026- mammogram and bone density test # Follow up in 6 months MD-   No labs-  Dr.B  Addendum: Patient had infiltration of her Reclast  infusion.  Status post evaluation with APP; commend icing.    All questions  were answered. The patient/family knows to call the clinic with any problems, questions or concerns.    Cindy JONELLE Joe, MD 10/13/2024 8:40 PM

## 2024-10-12 NOTE — Progress Notes (Unsigned)
 Patient states questions regarding a DEXA scan, last scan was in 2022.

## 2024-10-12 NOTE — Assessment & Plan Note (Signed)
#   March 2022- Right breast cancer Stage IA ER/PR positive; her 2 NEG' Oncotype- low risk.  S/p RT [03/20/2021]; JAN 2025-[Dr.Sakai] Bil Mammo-BIL.on anastrazole-stable.   # Tolerating anastrozole  fairly well with minor side effects- hot flashes.  Continue anastrozole  for total of 5 years [until spring 2027]- stable.   # Hot flashes-sec to anastrazole G-1- monitor for now- stable.   # OSTEOPOROSIS-T score= - 4.0 on reclast   Q4m [OCT 2022]. Continue calcium  plus vitamin D. [dentist-Dr.Monahan].  Suspect acute infusion reaction rather than osteonecrosis of jaw.  Discussed regarding Tylenol /Benadryl as needed postinfusion. Stable. Will order BMD with next mammogram-   Reclast  q 7m; Dr.SCott-Labs-7 days prior Mychart-BMD # DISPOSITION: # Reclast  today # in jan 2026- mammogram and bone density test # Follow up in 6 months MD-   No labs-  Dr.B  Addendum: Patient had infiltration of her Reclast  infusion.  Status post evaluation with APP; commend icing.

## 2024-10-13 ENCOUNTER — Encounter: Payer: Self-pay | Admitting: Internal Medicine

## 2024-10-13 ENCOUNTER — Ambulatory Visit: Admitting: Internal Medicine

## 2024-10-13 VITALS — BP 126/74 | HR 89 | Temp 97.9°F | Ht 64.0 in | Wt 100.0 lb

## 2024-10-13 DIAGNOSIS — R739 Hyperglycemia, unspecified: Secondary | ICD-10-CM

## 2024-10-13 DIAGNOSIS — F439 Reaction to severe stress, unspecified: Secondary | ICD-10-CM | POA: Diagnosis not present

## 2024-10-13 DIAGNOSIS — M810 Age-related osteoporosis without current pathological fracture: Secondary | ICD-10-CM

## 2024-10-13 DIAGNOSIS — Z1231 Encounter for screening mammogram for malignant neoplasm of breast: Secondary | ICD-10-CM

## 2024-10-13 DIAGNOSIS — I447 Left bundle-branch block, unspecified: Secondary | ICD-10-CM

## 2024-10-13 DIAGNOSIS — E7800 Pure hypercholesterolemia, unspecified: Secondary | ICD-10-CM

## 2024-10-13 DIAGNOSIS — Z0000 Encounter for general adult medical examination without abnormal findings: Secondary | ICD-10-CM

## 2024-10-13 DIAGNOSIS — Z170 Estrogen receptor positive status [ER+]: Secondary | ICD-10-CM

## 2024-10-13 DIAGNOSIS — C50411 Malignant neoplasm of upper-outer quadrant of right female breast: Secondary | ICD-10-CM

## 2024-10-13 MED ORDER — ROSUVASTATIN CALCIUM 20 MG PO TABS: 20.0000 mg | ORAL_TABLET | Freq: Every day | ORAL | 1 refills | Status: AC

## 2024-10-13 NOTE — Assessment & Plan Note (Addendum)
 Physical today 10/13/24.  Colonoscopy 06/2018.  Recommended f/u in 10 years.  Mammogram 12/24/23 - birads I.  Recommended f/u screening mammogram in one year. Planning f/u mammogram and bone density in 12/2024.

## 2024-10-13 NOTE — Progress Notes (Signed)
 Subjective:    Patient ID: Tracy Frey, female    DOB: Aug 14, 1951, 73 y.o.   MRN: [id number]  Patient here for  Chief Complaint  Patient presents with   Annual Exam   Medical Management of Chronic Issues    HPI Here for a physical exam. Had f/u with Dr Rennie yesterday - follow up breast cancer.  Receiving reclast  - infusion yesterday. Last mammogram 12/25/23. Recommended one year f/u. Tolerating anastrozole  (continue until spring 2027). Tries to stay active. Walking 4 miles per day. No chest pain or sob reported. No cough or congestion. No abdominal pain or bowel change reported.    Past Medical History:  Diagnosis Date   Allergy 2006 and before   Sulfur/chocolate   Cancer (HCC) 2022   Hypercholesterolemia    Personal history of radiation therapy    Seasonal allergies    Past Surgical History:  Procedure Laterality Date   ABDOMINAL HYSTERECTOMY     BREAST EXCISIONAL BIOPSY Right 2003   neg   BREAST LUMPECTOMY Right    benign   BREAST LUMPECTOMY WITH SENTINEL LYMPH NODE BIOPSY Right 01/13/2021   Procedure: BREAST LUMPECTOMY WITH SENTINEL LYMPH NODE BX;  Surgeon: Dessa Reyes ORN, MD;  Location: [facility] ORS;  Service: General;  Laterality: Right;   COLONOSCOPY WITH PROPOFOL  N/A 06/25/2018   Procedure: COLONOSCOPY WITH PROPOFOL ;  Surgeon: Viktoria Lamar DASEN, MD;  Location: [facility] ENDOSCOPY;  Service: Endoscopy;  Laterality: N/A;   PARTIAL HYSTERECTOMY  1988   secondary to fibroids and heavy bleeding and endometriosis   TONSILLECTOMY     age 57   Family History  Problem Relation Age of Onset   Hypercholesterolemia Mother    Hypothyroidism Sister    Hypercholesterolemia Sister    Breast cancer Maternal Aunt 74   Varicose Veins Sister        Spider veins   Colon cancer Neg Hx    Social History   Socioeconomic History   Marital status: Married    Spouse name: Not on file   Number of children: 2   Years of education: Not on file   Highest education level:  Bachelor's degree (e.g., BA, AB, BS)  Occupational History    Employer: [employer]  Tobacco Use   Smoking status: Never   Smokeless tobacco: Never  Vaping Use   Vaping status: Never Used  Substance and Sexual Activity   Alcohol use: Yes    Alcohol/week: 0.0 standard drinks of alcohol    Comment: ocassionally   Drug use: No   Sexual activity: Not on file  Other Topics Concern   Not on file  Social History Narrative   Lives in [place] with husband; 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    Social Drivers of Corporate Investment Banker Strain: Low Risk  (03/16/2024)   Overall Financial Resource Strain (CARDIA)    Difficulty of Paying Living Expenses: Not hard at all  Food Insecurity: No Food Insecurity (03/16/2024)   Hunger Vital Sign    Worried About Running Out of Food in the Last Year: Never true    Ran Out of Food in the Last Year: Never true  Transportation Needs: No Transportation Needs (03/16/2024)   PRAPARE - Administrator, Civil Service (Medical): No    Lack of Transportation (Non-Medical): No  Physical Activity: Sufficiently Active (08/13/2023)   Exercise Vital Sign    Days of Exercise per Week: 5 days    Minutes of Exercise per Session:  40 min  Stress: No Stress Concern Present (03/16/2024)   [employer] of Occupational Health - Occupational Stress Questionnaire    Feeling of Stress : Not at all  Social Connections: Unknown (03/16/2024)   Social Connection and Isolation Panel    Frequency of Communication with Friends and Family: More than three times a week    Frequency of Social Gatherings with Friends and Family: Twice a week    Attends Religious Services: Patient declined    Database Administrator or Organizations: Yes    Attends Banker Meetings: Patient declined    Marital Status: Married     Review of Systems  Constitutional:  Negative for appetite change and unexpected weight change.  HENT:  Negative for  congestion, sinus pressure and sore throat.   Eyes:  Negative for pain and visual disturbance.  Respiratory:  Negative for cough, chest tightness and shortness of breath.   Cardiovascular:  Negative for chest pain, palpitations and leg swelling.  Gastrointestinal:  Negative for abdominal pain, diarrhea, nausea and vomiting.  Genitourinary:  Negative for difficulty urinating and dysuria.  Musculoskeletal:  Negative for joint swelling and myalgias.  Skin:  Negative for color change and rash.  Neurological:  Negative for dizziness and headaches.  Hematological:  Negative for adenopathy. Does not bruise/bleed easily.  Psychiatric/Behavioral:  Negative for agitation and dysphoric mood.        Objective:     BP 126/74   Pulse 89   Temp 97.9 F (36.6 C) (Oral)   Ht 5' 4 (1.626 m)   Wt 100 lb (45.4 kg)   SpO2 99%   BMI 17.16 kg/m  Wt Readings from Last 3 Encounters:  10/13/24 100 lb (45.4 kg)  10/12/24 103 lb (46.7 kg)  03/19/24 104 lb 6.4 oz (47.4 kg)    Physical Exam Vitals reviewed.  Constitutional:      General: She is not in acute distress.    Appearance: Normal appearance. She is well-developed.  HENT:     Head: Normocephalic and atraumatic.     Right Ear: External ear normal.     Left Ear: External ear normal.     Mouth/Throat:     Pharynx: No oropharyngeal exudate or posterior oropharyngeal erythema.  Eyes:     General: No scleral icterus.       Right eye: No discharge.        Left eye: No discharge.     Conjunctiva/sclera: Conjunctivae normal.  Neck:     Thyroid : No thyromegaly.  Cardiovascular:     Rate and Rhythm: Normal rate and regular rhythm.  Pulmonary:     Effort: No tachypnea, accessory muscle usage or respiratory distress.     Breath sounds: Normal breath sounds. No decreased breath sounds or wheezing.  Chest:  Breasts:    Right: No inverted nipple, mass, nipple discharge or tenderness (no axillary adenopathy).     Left: No inverted nipple, mass,  nipple discharge or tenderness (no axilarry adenopathy).  Abdominal:     General: Bowel sounds are normal.     Palpations: Abdomen is soft.     Tenderness: There is no abdominal tenderness.  Musculoskeletal:        General: No swelling or tenderness.     Cervical back: Neck supple.  Lymphadenopathy:     Cervical: No cervical adenopathy.  Skin:    Findings: No erythema or rash.  Neurological:     Mental Status: She is alert and oriented to person, place,  and time.  Psychiatric:        Mood and Affect: Mood normal.        Behavior: Behavior normal.         Outpatient Encounter Medications as of 10/13/2024  Medication Sig   anastrozole  (ARIMIDEX ) 1 MG tablet Take 1 tablet (1 mg total) by mouth daily.   Calcium  Citrate-Vitamin D (CALCIUM  CITRATE + D PO) Take 2 tablets by mouth 2 (two) times daily.   Cholecalciferol (VITAMIN D3) 1000 units CAPS Take 2.5 mcg by mouth daily.   clindamycin-benzoyl peroxide (BENZACLIN) gel Apply 1 application topically 2 (two) times daily as needed (acne).   ezetimibe  (ZETIA ) 10 MG tablet TAKE 1 TABLET BY MOUTH EVERY DAY   Fiber POWD Take 1 Dose by mouth daily. 1 dose = 1 tablespoon   Multiple Vitamins-Minerals (CENTRUM SILVER ADULT 50+) TABS Take 1 tablet by mouth daily.   Multiple Vitamins-Minerals (PRESERVISION AREDS PO) Take 1 tablet by mouth daily.   Probiotic Product (PROBIOTIC-10 PO) Take 1 capsule by mouth daily.   tretinoin (RETIN-A) 0.05 % cream Apply topically at bedtime as needed.   [DISCONTINUED] diphenhydrAMINE (BENADRYL) 25 MG tablet Take 25 mg by mouth at bedtime as needed for allergies.   [DISCONTINUED] NON FORMULARY Take 1 tablet by mouth daily at 6 (six) AM. Preservision Areds PO   rosuvastatin  (CRESTOR ) 20 MG tablet Take 1 tablet (20 mg total) by mouth daily.   [DISCONTINUED] rosuvastatin  (CRESTOR ) 20 MG tablet TAKE 1 TABLET BY MOUTH EVERY DAY   No facility-administered encounter medications on file as of 10/13/2024.     Lab  Results  Component Value Date   WBC 3.9 (L) 03/17/2024   HGB 15.0 03/17/2024   HCT 44.7 03/17/2024   PLT 189.0 03/17/2024   GLUCOSE 105 (H) 10/08/2024   CHOL 150 10/08/2024   TRIG 67.0 10/08/2024   HDL 70.40 10/08/2024   LDLDIRECT 168.1 04/17/2013   LDLCALC 66 10/08/2024   ALT 27 10/08/2024   AST 18 10/08/2024   NA 140 10/08/2024   K 3.8 10/08/2024   CL 99 10/08/2024   CREATININE 0.74 10/08/2024   BUN 15 10/08/2024   CO2 32 10/08/2024   TSH 2.63 03/17/2024   HGBA1C 6.1 10/08/2024    MM 3D SCREENING MAMMOGRAM BILATERAL BREAST Result Date: 12/25/2023 CLINICAL DATA:  Screening. History of treated right breast cancer, status post breast conservation therapy. EXAM: DIGITAL SCREENING BILATERAL MAMMOGRAM WITH TOMOSYNTHESIS AND CAD TECHNIQUE: Bilateral screening digital craniocaudal and mediolateral oblique mammograms were obtained. Bilateral screening digital breast tomosynthesis was performed. The images were evaluated with computer-aided detection. COMPARISON:  Previous exam(s). ACR Breast Density Category d: The breasts are extremely dense, which lowers the sensitivity of mammography. FINDINGS: There are no findings suspicious for malignancy. IMPRESSION: No mammographic evidence of malignancy. A result letter of this screening mammogram will be mailed directly to the patient. RECOMMENDATION: Screening mammogram in one year. (Code:SM-B-01Y) BI-RADS CATEGORY  1: Negative. Electronically Signed   By: Dobrinka  Dimitrova M.D.   On: 12/25/2023 11:47       Assessment & Plan:  Routine general medical examination at a health care facility  Hypercholesterolemia Assessment & Plan: Continues on crestor  and zetia . Follow lipid panel and liver function tests.   Lab Results  Component Value Date   CHOL 150 10/08/2024   HDL 70.40 10/08/2024   LDLCALC 66 10/08/2024   LDLDIRECT 168.1 04/17/2013   TRIG 67.0 10/08/2024   CHOLHDL 2 10/08/2024     Orders: -  Hepatic function panel; Future -      Basic metabolic panel with GFR; Future -     Lipid panel; Future -     CBC with Differential/Platelet; Future -     TSH; Future  Hyperglycemia Assessment & Plan: Follow met b and A1c.   Orders: -     Hemoglobin A1c; Future  Encounter for screening mammogram for malignant neoplasm of breast  Health care maintenance Assessment & Plan: Physical today 10/13/24.  Colonoscopy 06/2018.  Recommended f/u in 10 years.  Mammogram 12/24/23 - birads I.  Recommended f/u screening mammogram in one year. Planning f/u mammogram and bone density in 12/2024.    Stress Assessment & Plan: Overall appears to be handling things relatively well.  Follow.    Osteoporosis without current pathological fracture, unspecified osteoporosis type Assessment & Plan: Had f/u with Dr Rennie yesterday - follow up breast cancer.  Receiving reclast  - infusion yesterday.    Left bundle branch block Assessment & Plan: Saw cardiology 01/2023.  CTA - reviewed.  On crestor  and zetia . Walking. Continue risk factor modification.    Hyperbilirubinemia Assessment & Plan: Slight elevation. Discussed probable normal variant. Follow. Recheck liver panel in the next several weeks.   Orders: -     Hepatic function panel; Future  Malignant neoplasm of upper-outer quadrant of right breast in female, estrogen receptor positive (HCC) Assessment & Plan: On arimidex  and tolerating.  Receiving reclast .  Continue f/u with Dr Rennie. Stable. Tolerating anastrozole  (continue until spring 2027). Planning f/u mammogram and bone density as outlined.   Orders: -     Vitamin B12; Future  Other orders -     Rosuvastatin  Calcium ; Take 1 tablet (20 mg total) by mouth daily.  Dispense: 90 tablet; Refill: 1     Allena Hamilton, MD

## 2024-10-14 ENCOUNTER — Encounter: Payer: Self-pay | Admitting: Internal Medicine

## 2024-10-14 NOTE — Progress Notes (Signed)
 Symptom Management Clinic  [facility] A Department of the [place]. [facility] [address] [place], [place] [address] [phone] (phone) [phone] (fax)  Patient Care Team: Glendia Shad, MD as PCP - General (Internal Medicine) Glendia Shad, MD (Internal Medicine) Rennie Cindy SAUNDERS, MD as Consulting Physician (Internal Medicine) Lenn Aran, MD as Consulting Physician (Radiation Oncology) Tye Millet, DO as Consulting Physician (General Surgery) Perla Evalene PARAS, MD as Consulting Physician (Cardiology)   Name of the patient: Tracy Frey  [id number]  08/22/1951   Date of visit: 10/12/24  Diagnosis- Breast Cancer & osteoporosis  Chief complaint/ Reason for visit- infiltration of reclast   Heme/Onc history:  Oncology History Overview Note  # RIGHT BREAST INVASIVE MAMMARY CARCINOMA- STAGE IA- pT1c sn pN0; ER- > 90%/PR 51-90%; Her-2 NEG [s/p Lumpec; SLNBx; Dr.Byrnett]; Oncotype DX score: 8, less than 1% chance of benefit from adjuvant chemotherapy. [s/p TR- April, 18th, 2022]  # April mid -2022- START Anastrazole   # OSTEOPOROSIS:BMD- T score= -4.1 [Fosomax- Jan 2022]; 09/20/2021- Reclast     # SURVIVORSHIP:   # GENETICS: declined  DIAGNOSIS: Right breast cancer  STAGE:   I      ;  GOALS: cure  CURRENT/MOST RECENT THERAPY : RT/AI    Carcinoma of upper-outer quadrant of right breast in female, estrogen receptor positive (HCC)  02/06/2021 Initial Diagnosis   Carcinoma of upper-outer quadrant of right breast in female, estrogen receptor positive (HCC)   02/06/2021 Cancer Staging   Staging form: Breast, AJCC 8th Edition - Pathologic: Stage IA (pT1c, pN0, cM0, G1, ER+, PR+, HER2-) - Signed by Rennie Cindy SAUNDERS, MD on 02/06/2021 Stage prefix: Initial diagnosis Histologic grading system: 3 grade system Menopausal status: Postmenopausal     Interval history- Patient is 73 year old female who is in  clinic today for infusion of reclast . IV was started and she noticed bubble and tenderness surrounding her iv site. She notified nursing who stopped infusion, new IV was started, and patient is now receiving reclast  without complication. Nursing applied a compress per pharmacy instructions and asked for evaluation. Patient observes a bubble at skin. Mildly tender. No redness.   Review of systems- Review of Systems  Reason unable to perform ROS: per HPI.     Allergies  Allergen Reactions   Amoxicillin -Pot Clavulanate Nausea Only   Chocolate Hives and Rash   Sulfa Antibiotics Hives   Past Medical History:  Diagnosis Date   Allergy 2006 and before   Sulfur/chocolate   Cancer (HCC) 2022   Hypercholesterolemia    Personal history of radiation therapy    Seasonal allergies     Current Outpatient Medications:    anastrozole  (ARIMIDEX ) 1 MG tablet, Take 1 tablet (1 mg total) by mouth daily., Disp: 90 tablet, Rfl: 3   Calcium  Citrate-Vitamin D (CALCIUM  CITRATE + D PO), Take 2 tablets by mouth 2 (two) times daily., Disp: , Rfl:    Cholecalciferol (VITAMIN D3) 1000 units CAPS, Take 2.5 mcg by mouth daily., Disp: , Rfl:    clindamycin-benzoyl peroxide (BENZACLIN) gel, Apply 1 application topically 2 (two) times daily as needed (acne)., Disp: , Rfl:    ezetimibe  (ZETIA ) 10 MG tablet, TAKE 1 TABLET BY MOUTH EVERY DAY, Disp: 90 tablet, Rfl: 3   Fiber POWD, Take 1 Dose by mouth daily. 1 dose = 1 tablespoon, Disp: , Rfl:    Multiple Vitamins-Minerals (CENTRUM SILVER ADULT 50+) TABS, Take 1 tablet by mouth daily., Disp: , Rfl:  Multiple Vitamins-Minerals (PRESERVISION AREDS PO), Take 1 tablet by mouth daily., Disp: , Rfl:    Probiotic Product (PROBIOTIC-10 PO), Take 1 capsule by mouth daily., Disp: , Rfl:    rosuvastatin  (CRESTOR ) 20 MG tablet, Take 1 tablet (20 mg total) by mouth daily., Disp: 90 tablet, Rfl: 1   tretinoin (RETIN-A) 0.05 % cream, Apply topically at bedtime as needed., Disp: , Rfl:    Physical exam: There were no vitals filed for this visit. Physical Exam Constitutional:      Appearance: She is not ill-appearing.  Skin:        Comments: New iv in left ac infusing.   Neurological:     Mental Status: She is alert.     Assessment and plan- Patient is a 73 y.o. female   Infiltration of zoledronic  acid- low volume given that symptoms were noticed promptly. Infusion was stopped. Cold compresses were applied. I recommend continuing cold compresses to affected area for 15-20 minutes every 6 hours for next 48 hours. We discussed that zoledronic  acid is considered an irritant as opposed to vesicant. If symptoms worsen, recommend reevaluation.    Visit Diagnosis 1. Intravenous infiltration, initial encounter    Patient expressed understanding and was in agreement with this plan. She also understands that She can call clinic at any time with any questions, concerns, or complaints.   Thank you for allowing me to participate in the care of this very pleasant patient.   Tinnie Dawn, DNP, AGNP-C, AOCNP [facility] [phone]

## 2024-10-16 DIAGNOSIS — L821 Other seborrheic keratosis: Secondary | ICD-10-CM | POA: Diagnosis not present

## 2024-10-16 DIAGNOSIS — L853 Xerosis cutis: Secondary | ICD-10-CM | POA: Diagnosis not present

## 2024-10-16 DIAGNOSIS — D2262 Melanocytic nevi of left upper limb, including shoulder: Secondary | ICD-10-CM | POA: Diagnosis not present

## 2024-10-16 DIAGNOSIS — D2272 Melanocytic nevi of left lower limb, including hip: Secondary | ICD-10-CM | POA: Diagnosis not present

## 2024-10-16 DIAGNOSIS — D2271 Melanocytic nevi of right lower limb, including hip: Secondary | ICD-10-CM | POA: Diagnosis not present

## 2024-10-16 DIAGNOSIS — D225 Melanocytic nevi of trunk: Secondary | ICD-10-CM | POA: Diagnosis not present

## 2024-10-16 DIAGNOSIS — L700 Acne vulgaris: Secondary | ICD-10-CM | POA: Diagnosis not present

## 2024-10-16 DIAGNOSIS — D2261 Melanocytic nevi of right upper limb, including shoulder: Secondary | ICD-10-CM | POA: Diagnosis not present

## 2024-10-19 ENCOUNTER — Encounter: Payer: Self-pay | Admitting: Internal Medicine

## 2024-10-19 NOTE — Assessment & Plan Note (Signed)
Overall appears to be handling things relatively well.  Follow.   

## 2024-10-19 NOTE — Assessment & Plan Note (Signed)
 Slight elevation. Discussed probable normal variant. Follow. Recheck liver panel in the next several weeks.

## 2024-10-19 NOTE — Assessment & Plan Note (Signed)
 Saw cardiology 01/2023.  CTA - reviewed.  On crestor  and zetia . Walking. Continue risk factor modification.

## 2024-10-19 NOTE — Assessment & Plan Note (Signed)
 Follow met b and A1c.

## 2024-10-19 NOTE — Assessment & Plan Note (Signed)
 On arimidex  and tolerating.  Receiving reclast .  Continue f/u with Dr Rennie. Stable. Tolerating anastrozole  (continue until spring 2027). Planning f/u mammogram and bone density as outlined.

## 2024-10-19 NOTE — Assessment & Plan Note (Signed)
 Had f/u with Dr Rennie yesterday - follow up breast cancer.  Receiving reclast  - infusion yesterday.

## 2024-10-19 NOTE — Assessment & Plan Note (Signed)
 Continues on crestor  and zetia . Follow lipid panel and liver function tests.   Lab Results  Component Value Date   CHOL 150 10/08/2024   HDL 70.40 10/08/2024   LDLCALC 66 10/08/2024   LDLDIRECT 168.1 04/17/2013   TRIG 67.0 10/08/2024   CHOLHDL 2 10/08/2024

## 2024-11-05 ENCOUNTER — Telehealth: Payer: Self-pay

## 2024-11-05 NOTE — Telephone Encounter (Signed)
 Ok to schedule for after the holidays. Make sure to make note on lab schedule - liver and B12 only lab.

## 2024-11-05 NOTE — Telephone Encounter (Signed)
 Copied from CRM [id number]. Topic: Appointments - Appointment Info/Confirmation >> Nov 05, 2024 11:25 AM Rosina BIRCH wrote: Patient/patient representative is calling for information regarding an appointment.   Patient called to cancel her appointment because she has a bad cold and she read that it does affect the results. Patient want to know if she can do it after the holidays so she want affect anyone [phone]

## 2024-11-06 NOTE — Telephone Encounter (Signed)
 apopt made for end of dec

## 2024-11-10 ENCOUNTER — Other Ambulatory Visit

## 2024-12-01 ENCOUNTER — Other Ambulatory Visit

## 2024-12-08 ENCOUNTER — Other Ambulatory Visit (INDEPENDENT_AMBULATORY_CARE_PROVIDER_SITE_OTHER)

## 2024-12-08 DIAGNOSIS — C50411 Malignant neoplasm of upper-outer quadrant of right female breast: Secondary | ICD-10-CM

## 2024-12-08 DIAGNOSIS — Z170 Estrogen receptor positive status [ER+]: Secondary | ICD-10-CM

## 2024-12-08 DIAGNOSIS — E7800 Pure hypercholesterolemia, unspecified: Secondary | ICD-10-CM

## 2024-12-08 LAB — HEPATIC FUNCTION PANEL
ALT: 27 U/L (ref 3–35)
AST: 15 U/L (ref 5–37)
Albumin: 4.7 g/dL (ref 3.5–5.2)
Alkaline Phosphatase: 24 U/L — ABNORMAL LOW (ref 39–117)
Bilirubin, Direct: 0.2 mg/dL (ref 0.1–0.3)
Total Bilirubin: 1.2 mg/dL (ref 0.2–1.2)
Total Protein: 6.7 g/dL (ref 6.0–8.3)

## 2024-12-08 LAB — VITAMIN B12: Vitamin B-12: 869 pg/mL (ref 211–911)

## 2024-12-09 ENCOUNTER — Ambulatory Visit: Payer: Self-pay | Admitting: Internal Medicine

## 2024-12-24 ENCOUNTER — Ambulatory Visit
Admission: RE | Admit: 2024-12-24 | Discharge: 2024-12-24 | Disposition: A | Discharge status: Home / Self Care | Source: Ambulatory Visit | Attending: Internal Medicine | Admitting: Internal Medicine

## 2024-12-24 DIAGNOSIS — Z1231 Encounter for screening mammogram for malignant neoplasm of breast: Secondary | ICD-10-CM | POA: Insufficient documentation

## 2024-12-24 DIAGNOSIS — Z170 Estrogen receptor positive status [ER+]: Secondary | ICD-10-CM | POA: Diagnosis present

## 2024-12-24 DIAGNOSIS — M810 Age-related osteoporosis without current pathological fracture: Secondary | ICD-10-CM | POA: Insufficient documentation

## 2024-12-24 DIAGNOSIS — C50411 Malignant neoplasm of upper-outer quadrant of right female breast: Secondary | ICD-10-CM | POA: Insufficient documentation

## 2024-12-24 MED ORDER — OSELTAMIVIR PHOSPHATE 75 MG PO CAPS: 75.0000 mg | ORAL_CAPSULE | Freq: Two times a day (BID) | ORAL | 0 refills | Status: AC

## 2024-12-24 NOTE — Telephone Encounter (Signed)
 Called and spoke to Tracy Frey. Traveling. Has been exposed. No symptoms now. Request to have tamiflu  if needed. Given traveling, rx for tamiflu  sent in to [facility]. Discussed if symptoms and if able test for flu - will have treatment if needed.

## 2024-12-30 ENCOUNTER — Ambulatory Visit: Payer: Self-pay | Admitting: Internal Medicine

## 2025-01-12 ENCOUNTER — Other Ambulatory Visit

## 2025-01-18 ENCOUNTER — Ambulatory Visit: Admitting: Internal Medicine

## 2025-02-11 ENCOUNTER — Ambulatory Visit: Admitting: Internal Medicine

## 2025-04-14 ENCOUNTER — Inpatient Hospital Stay: Admitting: Internal Medicine
